# Patient Record
Sex: Female | Born: 1985
Health system: Southern US, Community
[De-identification: ages and names within clinical notes are randomized; demographics above are authoritative.]

## PROBLEM LIST (undated history)

## (undated) ENCOUNTER — Inpatient Hospital Stay (HOSPITAL_COMMUNITY): Payer: Self-pay

## (undated) DIAGNOSIS — Z8759 Personal history of other complications of pregnancy, childbirth and the puerperium: Secondary | ICD-10-CM

## (undated) DIAGNOSIS — M1990 Unspecified osteoarthritis, unspecified site: Secondary | ICD-10-CM

## (undated) DIAGNOSIS — M47816 Spondylosis without myelopathy or radiculopathy, lumbar region: Secondary | ICD-10-CM

## (undated) DIAGNOSIS — J45909 Unspecified asthma, uncomplicated: Secondary | ICD-10-CM

## (undated) DIAGNOSIS — R519 Headache, unspecified: Secondary | ICD-10-CM

## (undated) DIAGNOSIS — T7840XA Allergy, unspecified, initial encounter: Secondary | ICD-10-CM

## (undated) DIAGNOSIS — R51 Headache: Secondary | ICD-10-CM

## (undated) DIAGNOSIS — N83201 Unspecified ovarian cyst, right side: Secondary | ICD-10-CM

## (undated) DIAGNOSIS — N200 Calculus of kidney: Secondary | ICD-10-CM

## (undated) DIAGNOSIS — Z8711 Personal history of peptic ulcer disease: Secondary | ICD-10-CM

## (undated) DIAGNOSIS — D649 Anemia, unspecified: Secondary | ICD-10-CM

## (undated) DIAGNOSIS — T4145XA Adverse effect of unspecified anesthetic, initial encounter: Secondary | ICD-10-CM

## (undated) DIAGNOSIS — T8859XA Other complications of anesthesia, initial encounter: Secondary | ICD-10-CM

## (undated) DIAGNOSIS — Z8719 Personal history of other diseases of the digestive system: Secondary | ICD-10-CM

## (undated) HISTORY — PX: OVARIAN CYST REMOVAL: SHX89

## (undated) HISTORY — DX: Allergy, unspecified, initial encounter: T78.40XA

## (undated) HISTORY — DX: Personal history of peptic ulcer disease: Z87.11

## (undated) HISTORY — PX: HUMERUS SURGERY: SHX672

## (undated) HISTORY — DX: Personal history of other diseases of the digestive system: Z87.19

## (undated) HISTORY — DX: Calculus of kidney: N20.0

## (undated) HISTORY — DX: Unspecified ovarian cyst, right side: N83.201

## (undated) HISTORY — DX: Unspecified osteoarthritis, unspecified site: M19.90

## (undated) HISTORY — DX: Other complications of anesthesia, initial encounter: T88.59XA

## (undated) HISTORY — PX: APPENDECTOMY: SHX54

## (undated) HISTORY — DX: Adverse effect of unspecified anesthetic, initial encounter: T41.45XA

---
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
## 2003-05-26 ENCOUNTER — Other Ambulatory Visit: Admission: RE | Admit: 2003-05-26 | Discharge: 2003-05-26 | Payer: Self-pay | Admitting: *Deleted

## 2004-05-18 ENCOUNTER — Encounter: Admission: RE | Admit: 2004-05-18 | Discharge: 2004-05-18 | Payer: Self-pay | Admitting: Family Medicine

## 2004-08-13 ENCOUNTER — Encounter: Admission: RE | Admit: 2004-08-13 | Discharge: 2004-08-13 | Payer: Self-pay | Admitting: Gastroenterology

## 2008-04-23 ENCOUNTER — Ambulatory Visit (HOSPITAL_COMMUNITY): Admission: RE | Admit: 2008-04-23 | Discharge: 2008-04-23 | Payer: Self-pay | Admitting: Obstetrics

## 2008-05-07 ENCOUNTER — Ambulatory Visit (HOSPITAL_COMMUNITY): Admission: RE | Admit: 2008-05-07 | Discharge: 2008-05-07 | Payer: Self-pay | Admitting: Obstetrics

## 2008-05-26 ENCOUNTER — Inpatient Hospital Stay (HOSPITAL_COMMUNITY): Admission: AD | Admit: 2008-05-26 | Discharge: 2008-05-26 | Payer: Self-pay | Admitting: Obstetrics

## 2008-06-01 ENCOUNTER — Inpatient Hospital Stay (HOSPITAL_COMMUNITY): Admission: AD | Admit: 2008-06-01 | Discharge: 2008-06-01 | Payer: Self-pay | Admitting: Obstetrics and Gynecology

## 2008-09-27 ENCOUNTER — Inpatient Hospital Stay (HOSPITAL_COMMUNITY): Admission: AD | Admit: 2008-09-27 | Discharge: 2008-09-29 | Payer: Self-pay | Admitting: Obstetrics

## 2009-12-06 ENCOUNTER — Inpatient Hospital Stay (HOSPITAL_COMMUNITY): Admission: AD | Admit: 2009-12-06 | Discharge: 2009-12-06 | Payer: Self-pay | Admitting: Obstetrics

## 2009-12-06 ENCOUNTER — Ambulatory Visit: Payer: Self-pay | Admitting: Obstetrics and Gynecology

## 2010-04-12 ENCOUNTER — Inpatient Hospital Stay (HOSPITAL_COMMUNITY): Admission: AD | Admit: 2010-04-12 | Discharge: 2010-04-12 | Payer: Self-pay | Admitting: Obstetrics and Gynecology

## 2010-06-25 ENCOUNTER — Inpatient Hospital Stay (HOSPITAL_COMMUNITY)
Admission: AD | Admit: 2010-06-25 | Discharge: 2010-06-27 | Payer: Self-pay | Source: Home / Self Care | Attending: Obstetrics & Gynecology | Admitting: Obstetrics & Gynecology

## 2010-06-28 LAB — CBC
Hemoglobin: 12.2 g/dL (ref 12.0–15.0)
MCV: 89.9 fL (ref 78.0–100.0)
Platelets: 193 10*3/uL (ref 150–400)
RBC: 4.07 MIL/uL (ref 3.87–5.11)
WBC: 7.1 10*3/uL (ref 4.0–10.5)

## 2010-06-29 LAB — CBC
MCV: 90 fL (ref 78.0–100.0)
Platelets: 165 10*3/uL (ref 150–400)
RBC: 3.39 MIL/uL — ABNORMAL LOW (ref 3.87–5.11)
WBC: 11.2 10*3/uL — ABNORMAL HIGH (ref 4.0–10.5)

## 2010-07-10 ENCOUNTER — Inpatient Hospital Stay (HOSPITAL_COMMUNITY)
Admission: AD | Admit: 2010-07-10 | Discharge: 2010-07-10 | Disposition: A | Discharge status: Home / Self Care | Payer: Managed Care, Other (non HMO) | Source: Ambulatory Visit | Attending: Obstetrics and Gynecology | Admitting: Obstetrics and Gynecology

## 2010-07-10 DIAGNOSIS — N390 Urinary tract infection, site not specified: Secondary | ICD-10-CM | POA: Insufficient documentation

## 2010-07-10 DIAGNOSIS — O2390 Unspecified genitourinary tract infection in pregnancy, unspecified trimester: Secondary | ICD-10-CM | POA: Insufficient documentation

## 2010-07-10 LAB — URINALYSIS, ROUTINE W REFLEX MICROSCOPIC
Bilirubin Urine: NEGATIVE
Bilirubin Urine: NEGATIVE
Ketones, ur: NEGATIVE mg/dL
Leukocytes, UA: NEGATIVE
Nitrite: NEGATIVE
Protein, ur: NEGATIVE mg/dL
Specific Gravity, Urine: 1.02 (ref 1.005–1.030)
Urobilinogen, UA: 0.2 mg/dL (ref 0.0–1.0)
pH: 6.5 (ref 5.0–8.0)

## 2010-07-10 LAB — URINE MICROSCOPIC-ADD ON

## 2010-07-11 LAB — URINE CULTURE
Colony Count: NO GROWTH
Culture: NO GROWTH

## 2010-07-22 ENCOUNTER — Ambulatory Visit (HOSPITAL_COMMUNITY)
Admission: RE | Admit: 2010-07-22 | Discharge: 2010-07-22 | Disposition: A | Discharge status: Home / Self Care | Payer: Managed Care, Other (non HMO) | Source: Ambulatory Visit | Attending: Obstetrics | Admitting: Obstetrics

## 2010-07-22 ENCOUNTER — Other Ambulatory Visit (HOSPITAL_COMMUNITY): Payer: Self-pay | Admitting: Obstetrics

## 2010-07-22 DIAGNOSIS — R1011 Right upper quadrant pain: Secondary | ICD-10-CM

## 2010-07-22 DIAGNOSIS — O99893 Other specified diseases and conditions complicating puerperium: Secondary | ICD-10-CM | POA: Insufficient documentation

## 2010-07-30 ENCOUNTER — Ambulatory Visit: Payer: Managed Care, Other (non HMO) | Admitting: Family Medicine

## 2010-08-22 LAB — URINALYSIS, ROUTINE W REFLEX MICROSCOPIC
Bilirubin Urine: NEGATIVE
Protein, ur: NEGATIVE mg/dL
Urobilinogen, UA: 0.2 mg/dL (ref 0.0–1.0)

## 2010-08-22 LAB — URINE MICROSCOPIC-ADD ON

## 2010-08-22 LAB — POCT PREGNANCY, URINE: Preg Test, Ur: POSITIVE

## 2010-09-15 LAB — CBC
HCT: 37.3 % (ref 36.0–46.0)
Hemoglobin: 11.4 g/dL — ABNORMAL LOW (ref 12.0–15.0)
Hemoglobin: 13 g/dL (ref 12.0–15.0)
MCHC: 35 g/dL (ref 30.0–36.0)
Platelets: 157 10*3/uL (ref 150–400)
RBC: 3.44 MIL/uL — ABNORMAL LOW (ref 3.87–5.11)
WBC: 14.8 10*3/uL — ABNORMAL HIGH (ref 4.0–10.5)
WBC: 6.8 10*3/uL (ref 4.0–10.5)

## 2010-09-26 ENCOUNTER — Emergency Department (HOSPITAL_BASED_OUTPATIENT_CLINIC_OR_DEPARTMENT_OTHER)
Admission: EM | Admit: 2010-09-26 | Discharge: 2010-09-26 | Discharge status: Against Medical Advice | Payer: Managed Care, Other (non HMO) | Attending: Emergency Medicine | Admitting: Emergency Medicine

## 2010-10-22 NOTE — Discharge Summary (Signed)
[facility]  Patient:    Alyssa Le, Alyssa Le Visit Number: [id number] MRN: [id number]          Service Type: SUR Location: 4W 0462 01 Attending Physician:  Arlis Porta Dictated by:   Adolph Pollack, M.D. Admit Date:  04/06/2001 Discharge Date: 04/09/2001   CC:         Marina Gravel, M.D.  Bradly Bienenstock, M.D., [facility]   Discharge Summary  PRINCIPAL DISCHARGE DIAGNOSES: 1. Recurrent appendicitis. 2. Right ovarian cyst. 3. Acute postanesthetic pulmonary edema.  PROCEDURES:  Laparoscopic completion appendectomy and right ovarian cystectomy, April 06, 2001.  REASON FOR ADMISSION:  This is a 25 year old female who underwent a laparoscopic appendectomy for appendicitis up in [place] in May 2002.  In September, she presented with a right lower quadrant abscess.  CT scan demonstrated some residual appendix.  A barium enema verified this.  After her abscess had resolved, it was suggested that she undergo a laparoscopic completion appendectomy.  Also, she was noted to have a right ovarian cyst and that was going to be looked at by Dr. Marina Gravel.  HOSPITAL COURSE:  She underwent the above procedures.  After extubation, she had some laryngeal spasm and coughed against this.  This led to the reaction of some acute pulmonary edema.  She had some hemoptysis.  She was treated with intravenous Lasix and continued to be observed.  She slowly improved from this standpoint.  She was having adequate pain control.  Her wounds looked clean and intact.  She had adequate saturations on room air.  This phenomena was discussed with her so that she could tell anesthesiologist in the future about what had happened.  By her third postoperative day, she was ready to be discharged to home.  DISPOSITION:  Discharged to home on April 09, 2001, in satisfactory condition.  She was told she could return to school in a week.  She was told to  take her home medications, given Tylox for pain.  She will follow up with both Dr. Earlene Plater and myself. Dictated by:   Adolph Pollack, M.D. Attending Physician:  Arlis Porta DD:  04/27/01 TD:  04/28/01 Job: [id number]

## 2010-10-22 NOTE — Op Note (Signed)
[facility]  Patient:    Alyssa Le, Alyssa Le Visit Number: [id number] MRN: [id number]          Service Type: SUR Location: 4W 0462 01 Attending Physician:  Arlis Porta Dictated by:   Adolph Pollack, M.D. Proc. Date: 04/06/01 Admit Date:  04/06/2001 Discharge Date: 04/09/2001   CC:         Marina Gravel, M.D.   Operative Report  PREOPERATIVE DIAGNOSIS:  Recurrent right lower quadrant abscess with possible "stump" appendicitis.  POSTOPERATIVE DIAGNOSIS:  Recurrent right low quadrant abscess, secondary to "stump" appendicitis.  OPERATION:  Diagnostic laparoscopy with laparoscopic appendectomy.  SURGEON:  Adolph Pollack, M.D.  ANESTHESIA:  General.  INDICATIONS:  Ms. Alyssa Le is a 25 year old female, who in May of this year underwent laparoscopic appendectomy for acute appendicitis.  In September of this year she presented to [facility] with right lower quadrant pain, and was found to have a right lower quadrant abscess.  CT scan was suggestive of a possible retained appendix with appendicolith.  She had this percutaneously drained and was placed on IV and then oral antibiotics and underwent a barium enema which again suggested a structure consistent with a retained appendix.  She has been maintained on oral antibiotics and is now brought to the operating room.  She is also going to have an ovarian cystectomy by Dr. Delia Heady.  DESCRIPTION OF PROCEDURE: She is placed supine on the operating table and a general anesthetic was administered.  She was then placed in the lithotomy position. The abdomen and perineal area were sterilely prepped and draped.  A subumbilical incision was made incising the skin sharply.  The subcutaneous tissue was divided sharply.  The fascia and peritoneum were divided sharply under direct vision and the perineal cavity was entered.  A pursestring suture of 0 Vicryl was placed around the fascial edges.  A  Hasson trocar was introduced into the peritoneal cavity and pneumoperitoneum created by insufflation of CO2 gas.  Next a laparoscope was introduced.  Dr. Earlene Plater had previously but a tenaculum on the cervix, and he mobilized the uterus in order to view the right ovarian cyst.  A 5 mm trocar was placed through a previous incision in the lower midline and one was also placed in the left lower quadrant.  Dr. Earlene Plater proceeded with a left ovarian cystectomy as described in his dictated note.  After Dr. Earlene Plater had finished, I approached the cecum and mobilized by dividing adhesions and the lateral peritoneal attachments.  I identified the ileocecal valve and noticed a long structure.  I noticed a staple line across the small structure, mobilizing it and there was retained appendix stump.  I divided mesoappendix with the harmonic scalpel.  I got down to the base of the appendix and its junction with the cecum and I divided the appendix here.  I put it in an Endopouch bag and removed it from the field.  Next, I copiously irrigated out the abdominal cavity with 1.5 liter of saline solution.  I inspected the ovarian cyst area and the cecal area and saw no bleeding.  I evacuated all the fluid.  I then removed the 5 mm trocars under direct vision and saw no bleeding from them.  I released the pneumoperitoneum, removed the [facility] trocar and closed the fascial defect under direct vision by tightening up and tying down the pursestring suture.  Skin incisions were subsequently closed with 4-0 Monocryl subcuticular sutures, followed by Steri-Strips and sterile dressings.  The patient tolerated the procedure well, finding no apparent complications.  She was taken to the recovery room in satisfactory condition. Dictated by:   Adolph Pollack, M.D. Attending Physician:  Arlis Porta DD:  04/06/01 TD:  04/09/01 Job: [id number]

## 2010-10-22 NOTE — Consult Note (Signed)
[facility]  Patient:    Alyssa Le, Alyssa Le Visit Number: [id number] MRN: [id number]          Service Type: PED Location: PEDS [phone] Attending Physician:  McDiarmid, Leighton Roach. Consultation Date: 02/15/01 Admit Date:  02/16/2001   CC:         Conni Elliot, M.D.   Consultation Report  REASON FOR CONSULTATION:      Right lower quadrant pain and abscess.  HISTORY OF PRESENT ILLNESS:   Ms. Alyssa Le is a 25 year old female who underwent a laparoscopic appendectomy in May of 2002 for acute appendicitis.  She had done well until approximately two weeks ago when she began again having mild diffuse abdominal pain.  The pain worsened and on September 8, she had severe pain in the right lower quadrant leading her to come to the emergency department at [facility].  She had some nausea, but no vomiting.  She was tolerating a liquid diet.  She was initially seen at [facility] emergency department and felt to have pelvic inflammatory disease and transferred to [facility] where she was admitted and started on appropriate antibiotics.  She has continued to have some fever spikes, although, the white blood cell count has improved. She continued to complain of right lower quadrant pain and had right lower quadrant tenderness.  Subsequently, a CT scan of her lower abdomen and pelvis was ordered which demonstrates a right lower quadrant inflammatory process with some fluid consistent with abscess.  There is some thickening of the terminal ileum.  It was for this reason that I was called.  PAST MEDICAL HISTORY:         1. Asthma.                               2. Depression.  PAST SURGICAL HISTORY:        Laparoscopic appendectomy; bone lengthening.  ALLERGIES:                    No known drug allergies.  MEDICATIONS:                  1. Colace.                               2. Doxycycline.             3. Prozac.                               4. Cefotan.                               5. Ambien.                               6. Phenergan p.r.n.                               7. Tylox.  SOCIAL HISTORY:               She has moved here with her mother and father. She has a boyfriend back in the [place].  REVIEW OF  SYSTEMS:            She states she has been voiding alot since she has been in the hospital, but it has not been painful.  She denies any diarrhea.  PHYSICAL EXAMINATION:  GENERAL:                      A well-developed, well-nourished female who is quite emotional at times.  VITAL SIGNS:                  Temperature 100.9.  ABDOMEN:                      Soft with right lower quadrant and pelvic tenderness to palpation and percussion.  Active bowel sounds are noted.  There is well-healed small scars.  She has a Armed forces logistics/support/administrative officer in.  No masses are noted.  LABORATORY DATA:              Todays white blood cell count 6800 with no left shift.  Her gynecoccal and Chlamydia screens are negative.  HIV negative.  IMPRESSION:                   Right lower quadrant inflammatory process with abscess.  Differential diagnosis includes retained stump appendicitis with perforation and abscess, terminal ileitis and microperforation and abscess, cecal diverticulitis, Meckels diverticulitis.  RECOMMENDATIONS:              I would change her antibiotics to Unasyn to include potential Enterococcal coverage as this could be superinfection and could be a significant pathogen in this case.  I would recommend that she have radiologic percutaneous drainage of the abscess cavity (she likely will need to be transferred to [facility] to have this done).  If she is unable to have that done, she may well need exploratory laparotomy.  I did explain this to her and to her mother as well as the physician covering for the primary service. Attending Physician:  McDiarmid, Tawanna Cooler  D. DD:  02/16/01 TD:  02/16/01 Job: [id number]

## 2010-10-22 NOTE — Discharge Summary (Signed)
[place]  Patient:    Alyssa Le, DITULLIO Visit Number: [id number] MRN: [id number]          Service Type: PED Location: PEDS [phone] Attending Physician:  McDiarmid, Leighton Roach. Dictated by:   Bradly Bienenstock, M.D. Admit Date:  02/16/2001 Discharge Date: 02/18/2001   CC:         [facility]  Adolph Pollack, M.D.   Discharge Summary  ADMISSION DIAGNOSES: 1. Pelvic inflammatory disease. 2. History of depression.  DISCHARGE DIAGNOSES: 1. Periappendiceal abscess, status post drainage. 2. History of depression.  DISCHARGE MEDICATIONS: 1. Ciprofloxacin 500 mg 1 tablet b.i.d. x 10 days. 2. Flagyl 750 mg 1 tablet b.i.d. x 10 days. 3. Prozac 20 mg 1 tablet q.d. 4. Ortho Tri-Cyclen 1 tablet q.d. 5. Percocet 2.5/325 1-2 pills q.4-6h. p.r.n. pain.  ACTIVITY:  No restrictions.  WOUND CARE:  The patient was instructed to keep her abdominal wound clean.  FOLLOW-UP:  The patient is to follow up at the [facility] within the next week.  With Dr. Abbey Chatters in the next two to three weeks.  PROCEDURES:  The patient had an ultrasound-guided percutaneous drainage of ______  abscess.  Roughly 4 cc of purulent material was drained.  CONSULTATIONS:  Surgery, Dr. Avel Peace.  HISTORY OF PRESENT ILLNESS:  This is a 25 year old, G0, white female who presented to [facility] on February 12, 2001, with the chief complaint of onset of lower abdominal pain beginning two days prior to presentation.  The patient stated that she had been having nausea, as well, over the past couple of days but, on the day of presentation, the pain and the nausea increased to a fairly severe level.  The patient recorded a fever to 100.4 degrees on the day of presentation and also complained of intermittent back pain.  On questioning, the patient states that she had been with her current sexual partner for the  past 10 months and that she was not aware of her partner having any other partners.  REVIEW OF SYSTEMS:  Negative other than as described in the HPI.  PAST SURGICAL HISTORY:  Appendectomy, done in April 2002.  PAST MEDICAL HISTORY: 1. History of depression. 2. Asthma. 3. Bout of mononucleosis approximately three months ago.  SOCIAL HISTORY:  The child lives with both her parents and one sister.  They have recently moved here from [place].  PHYSICAL EXAMINATION:  VITAL SIGNS:  Temperature on presentation was 101.4.  HEENT:  Normal.  LUNGS:  Clear to auscultation.  ABDOMEN:  Soft.  Positive bowel sounds.  Tender to palpation bilaterally in the lower quadrants, right greater than left, with rebound tenderness.  PELVIC:  No external lesions.  There was a moderate discharge.  Cervix did show some cervical motion tenderness, and the patient seemed to be more tender in her right adnexa.  LABORATORY DATA:  White count 12.8, hemoglobin 10.5, platelets 212, shift was 93% neutrophils.  ASSESSMENT:  This was thought to be a case of pelvic inflammatory disease; therefore, the patient was admitted and begun on IV antibiotics.  She was begun on doxycycline and cefotetan.  HOSPITAL COURSE:  The patient was admitted to [facility] and begun on IV antibiotics but failed to improve on IV antibiotics.  A pelvic ultrasound was done which did not show any evidence of tubo-ovarian abscess.  Secondary to the patients persistent pain, as well as a GC and chlamydia test which  both came back negative, it was decided to get a CT scan to explore whether the patient might have other intra-abdominal processes going on.  On CT scan there which was performed on February 15, 2001, there was evidence of ______ inflammation in the area of the patients appendiceal stump.  When this was recognized, general surgery was consulted, and Dr. Avel Peace did evaluate her.  Dr. Abbey Chatters felt that the  patient should have a slight alteration of her antibiotics.  She was changed to Unasyn, and percutaneous drainage of the abscess was warranted.  Therefore, secondary to the need to perform the drainage, the patient was transferred to [facility] on February 16, 2001.  The patient underwent drainage of the abscess on February 16, 2001, with expression of roughly 4 cc of purulent material. After the abscess was decompressed, the patient did quite a bit better.  She had a break in her fever curve as well as diminishment of her pain.  She was able to tolerate food without nausea and vomiting.  Secondary to her marked improvement, it was decided that the patient could be discharged home on Monday, September ______ , 2002, with follow-up with Dr. Abbey Chatters to assess whether later surgical intervention might be necessary.  However, at this time it does not appear that that is the case.  CONDITION ON DISCHARGE:  The patient was discharged in good condition.  DISCHARGE LABORATORY DATA:  On February 17, 2001, white count was 7.4, hemoglobin was 9.4, hematocrit 27.7, platelet count of 162.  Hepatitis B core antibody negative.  HIV test negative.  Hepatitis B surface antigen negative. RPR nonreactive.  Cervical, gonorrhea, and chlamydia swabs were negative. Dictated by:   Bradly Bienenstock, M.D. Attending Physician:  McDiarmid, Tawanna Cooler D. DD:  02/19/01 TD:  02/19/01 Job: [id number]

## 2010-10-22 NOTE — H&P (Signed)
[facility]  Patient:    Alyssa Le, Alyssa Le Visit Number: [id number] MRN: [id number]          Service Type: PED Location: PEDS [phone] Attending Physician:  McDiarmid, Leighton Roach. Dictated by:   Marina Gravel, M.D. Admit Date:  02/16/2001 Discharge Date: 02/18/2001   CC:         Adolph Pollack, M.D.   History and Physical  CHIEF COMPLAINT:  Right ovarian cyst, right lower quadrant pain.  INTENDED PROCEDURE:  Diagnostic laparoscopy, right ovarian cystectomy and possible need for a right salpingo-oophorectomy.  HISTORY OF PRESENT ILLNESS:  Alyssa Le is a 25 year old female, gravida 0 who I saw at the request of Adolph Pollack, M.D. for recommendations regarding a right ovarian cyst. She has a history of laparoscopic appendectomy in May 2002 for acute appendicitis. She has then had recurrent right lower quadrant pain. Subsequently, has had an abscess in the right lower quadrant drained and now presents for revision of her appendectomy. However, during that workup, she was noted to have a simple right ovarian cyst. Adolph Pollack, M.D. asked me to see the patient for further recommendations.  Ultrasound was repeated in my office which shows a simple, 2.5 cm right ovarian cyst. This area is tender; and on pelvic exam, it is not very mobile concerning for adhesive disease presumably from the previous inflammatory process. The patient presents for definitive surgical therapy and diagnosis of her continued right lower quadrant pain.  PAST MEDICAL HISTORY:  Exercise-induced asthma and depression.  PAST SURGICAL HISTORY:  Laparoscopic appendectomy, right humerus bone lengthening, and CT drainage of right lower quadrant abscess.  CURRENT MEDICATIONS:  Ortho Tri-Cyclen and Prozac.  ALLERGIES:  None.  SOCIAL HISTORY:  No alcohol, tobacco, or drugs.  FAMILY HISTORY:  Noncontributory.  REVIEW OF SYSTEMS:  Otherwise noncontributory.  PHYSICAL  EXAMINATION:  VITAL SIGNS:  Blood pressure 90/60, pulse 60, weight 123.  GENERAL:  The patient is a thin, white female who is well groomed, normal body habitus, no deformities.  HEART:  Regular rate and rhythm.  LUNGS:  Clear to auscultation.  ABDOMEN:  Liver, spleen normal. No hernias. Previous laparoscopic incision at the umbilicus as healed well. No acute signs are noted on abdominal exam. However, the patient does have tenderness in the right lower quadrant.  PELVIC:  Normal external female genitalia. Vagina and cervix normal. Uterus normal size, anterior. There is an adnexal mass palpable on the right. It is poorly mobile, and this is where patient is tender. Otherwise, pelvic exam is unremarkable.  LABORATORY DATA:  Ultrasound was repeated as outlined above and shows persistent 2.5 cm simple, right ovarian cyst.  ASSESSMENT:  Right lower quadrant pain, history of appendicitis, status post laparoscopic appendectomy, and subsequent drainage of recurrent abscess. Now with persistent right lower quadrant pain and right ovarian cyst.  I discussed with the patient that the right ovarian cyst may not be involved in the acute process. However, there may be scarring from the ovary to the right lower quadrant where her previous abscess was drained. This may necessitate right salpingo-oophorectomy to aid in the complete clearing of the process in the right lower quadrant. However, would prefer to perform right ovarian cystectomy only and have Adolph Pollack, M.D. perform the revision of her appendectomy as he feels is indicated. Operative risks discussed with the patient including infection, bleeding, damage to bowel or bladder or surrounding organs, and again potential need for right salpingo-oophorectomy. All questions answered. The patient wishes to  proceed. Arrangements have been surgery this Friday at [facility] as a combined procedure with Adolph Pollack, M.D. and  myself as outlined above. Dictated by:   Marina Gravel, M.D. Attending Physician:  McDiarmidTawanna Cooler D. DD:  04/05/01 TD:  04/05/01 Job: [id number]

## 2010-10-22 NOTE — H&P (Signed)
[place]  Patient:    Alyssa Le, HORAN Visit Number: [id number] MRN: [id number]          Service Type: EMS Location: ED Attending Physician:  Devoria Albe Dictated by:   Ocie Doyne, M.D. Admit Date:  02/11/2001 Discharge Date: 02/11/2001   CC:         Adolph Pollack, M.D.   History and Physical  CHIEF COMPLAINT:  Right lower quadrant abscess.  HISTORY OF PRESENT ILLNESS:  This 25 year old female was admitted at [facility] on February 12, 2001, with a two-day history of gradually increasing abdominal pain, initially diffuse but admission it was radiating to the right lower quadrant and quite severe in intensity. She had a laparoscopic appendectomy in May of 2002, in [place] without complications. On admission, she was febrile to 101.4 and felt to have pelvic inflammatory disease based on her clinical presentation.  She was treated with appropriate antibiotics but failed to improve.  An abdominal and pelvic CT scan on February 15, 2001, revealed a right lower quadrant inflammatory process and abscess with terminal ileum thickening.  Surgery was consulted to see her and this was felt to possibly represent a recurrence of inflammation at the appendix stump. She was transferred to [facility] today for radiologic guided percutaneous drainage of the abscess.  PAST MEDICAL HISTORY:  Asthma and depression.  MEDICATIONS: 1. Prozac 10 mg q.d. 2. Ortho-Cyclen oral contraceptive pill. 3. At transfer, she is on Unasyn 3 g IV q.6h.  ALLERGIES:  No known drug allergies.  She reports her throat swells when she eats FRESH FRUIT.  SOCIAL HISTORY:  The patient moved from [place] three weeks ago.  She lives with her parents and a 42 year old sister. She is a Holiday representative in high school.  FAMILY HISTORY:  Noncontributory.  PHYSICAL EXAMINATION:  GENERAL APPEARANCE:  She is alert, pleasant but tearful in no  acute distress.  VITAL SIGNS:  Temperature 99.4, heart rate 72, blood pressure 113/54, respiratory rate 20, oxygen saturation 98% on room air.  HEENT:  Normocephalic and atraumatic.  Pupils are equal, round, reactive to light and accommodation.  Extraocular movements intact.  Oropharynx is without lesions or exudate.  NECK:  Supple with no lymphadenopathy, no thyromegaly.  LUNGS:  Clear to auscultation bilaterally with good air movement.  CARDIOVASCULAR:  Regular rate and rhythm with no murmur.  ABDOMEN:  Soft, nondistended with positive bowel sounds.  Tender at the right lower quadrant with guarding. There is no palpable mass.  EXTREMITIES:  Capillary refill less than two seconds.  There is no clubbing, cyanosis, or edema.  LABS:  White blood cell count 6.0, hemoglobin 12.6, hematocrit 36.3, platelets 330.  PTT 43, PT 12.7, INR 0.9.  Blood cultures drawn February 11, 2001, are still pending.  ASSESSMENT AND PLAN: 1. Abscess.  The patient is scheduled to undergo radiographically guided    percutaneous drain placement today.  Radiology has already been to the    floor to see her and they have the abdominal and pelvic CT that was    performed at [facility]. The patient will continue on IV Unasyn for    coverage of gram positives, gram negatives and anaerobes. She will remain    NPO until the drain is placed, then will resume a regular diet as    tolerated.  Maintenance IV fluids will be continued until she is able to    tolerate good p.o.  She is receiving  Tylox for discomfort and her pain has    been adequately controlled. Surgery will continue to follow her during this    hospitalization. 2. Depression.  The patient was tearful out of proportion to her pain. She    disclosed that she relocated to [place], [place], from [place]    approximately three weeks ago after her father lost his job. She has been    very sad about the move.  Additionally, her boyfriend  of the past one year    remained behind in [place]. She is planning to travel to see him in one    week and is concerned that she may not be able to make that trip because of    her hospitalization.  Her Prozac dose was increased this morning from 10 to    20 mg. No other medication changes will be made at the current time. The    patient denies any suicidal ideations and her depression may be more    situational than major depressive disorder. Her old records are not    available and this would be an issue that will require outpatient follow-up    over time to assess further. Dictated by:   Ocie Doyne, M.D. Attending Physician:  Devoria Albe DD:  02/16/01 TD:  02/16/01 Job: [id number]

## 2010-10-22 NOTE — Op Note (Signed)
[facility]  Patient:    Alyssa Le, Alyssa Le Visit Number: [id number] MRN: [id number]          Service Type: SUR Location: [id number] Attending Physician:  Arlis Porta Dictated by:   Marina Gravel, M.D. Proc. Date: 04/06/01 Admit Date:  04/06/2001 Discharge Date: 04/09/2001   CC:         Adolph Pollack, M.D.                           Operative Report  PREOPERATIVE DIAGNOSIS:  Right lower quadrant pain, persistent right ovarian cyst, and probable appendiceal stump.  POSTOPERATIVE DIAGNOSIS:  Right lower quadrant pain, persistent right ovarian cyst, and probable appendiceal stump.  PROCEDURE:  Laparoscopic right ovarian cystectomy.  SURGEON:  Marina Gravel, M.D.  ASSISTANT:  Adolph Pollack, M.D.  ANESTHESIA:  General.  SPECIMENS:  Right ovarian cyst wall.  FINDINGS:  Right simple ovarian cyst and collapsing left ovarian cyst, otherwise normal tubes, ovaries, and uterus.  ESTIMATED BLOOD LOSS:  Less than 50 cc.  COMPLICATIONS:  None.  INDICATIONS:  The patient with persistent right lower quadrant pain and recurrent stump appendicitis.  Was also found to have a right ovarian cyst during the workup with a CT scan.  I was asked by Dr. Abbey Chatters to be present for the surgery to evaluate and treat the ovarian cyst in conjunction with his laparoscopic revision of the appendiceal stump.  DESCRIPTION OF PROCEDURE:  The patient was taken to the operating room and general anesthesia obtained.  I examined the patient under anesthesia, and again noted a palpable right adnexal mass which was decreased in size from last exam.  The patient was then prepped and draped in the standard fashion. A Foley catheter was inserted into the bladder.  The cervix was visualized with the speculum and grasped with a tooth tenaculum on the anterior lip. Then, a ________ cannula was inserted into the cervical canal and attached to the tooth  tenaculum.  Attention was then turned to the umbilicus.  Dr. Abbey Chatters got into the abdomen with Hasson technique.  Please see his dictation for details.  Once we were in the abdomen, the pelvis was inspected with the above findings noted.  The patient was placed in the Trendelenburg position, the bowel mobilized superiorly with a blunt probe.  Inferior ports were inserted 2 cm above the symphysis in the midline and 8 cm off the midline, each a 5 mm trocar under direct laparoscopic visualization.  The right ovary was then grasped with atraumatic forceps, and the cyst wall entered with monopolar scissors.  The cyst wall was then divided sharply and the ovarian cyst removed with blunt technique.  This was submitted and the specimen labeled right ovarian cyst wall.  The cyst wall bed was then made hemostatic with monopolar cautery, irrigated, and found to be hemostatic.  My portion of the case was therefore terminated, and I left the patient in the care of Dr. Abbey Chatters who was assessing the patients appendiceal stump.  The patient tolerated the procedure well and there were no complications. Dictated by:   Marina Gravel, M.D. Attending Physician:  Arlis Porta DD:  04/06/01 TD:  04/09/01 Job: [id number]

## 2011-09-05 DIAGNOSIS — R319 Hematuria, unspecified: Secondary | ICD-10-CM | POA: Insufficient documentation

## 2011-09-12 ENCOUNTER — Ambulatory Visit (INDEPENDENT_AMBULATORY_CARE_PROVIDER_SITE_OTHER): Payer: BC Managed Care – PPO | Admitting: Physician Assistant

## 2011-09-12 VITALS — BP 122/66 | HR 60 | Temp 98.8°F | Resp 16 | Ht 66.0 in | Wt 122.0 lb

## 2011-09-12 DIAGNOSIS — R109 Unspecified abdominal pain: Secondary | ICD-10-CM

## 2011-09-12 DIAGNOSIS — R21 Rash and other nonspecific skin eruption: Secondary | ICD-10-CM

## 2011-09-12 DIAGNOSIS — R05 Cough: Secondary | ICD-10-CM

## 2011-09-12 DIAGNOSIS — R1032 Left lower quadrant pain: Secondary | ICD-10-CM

## 2011-09-12 LAB — POCT UA - MICROSCOPIC ONLY
Casts, Ur, LPF, POC: NEGATIVE
Crystals, Ur, HPF, POC: NEGATIVE
Yeast, UA: NEGATIVE

## 2011-09-12 LAB — POCT CBC
Lymph, poc: 1.8 (ref 0.6–3.4)
MCH, POC: 28.9 pg (ref 27–31.2)
MCHC: 33.1 g/dL (ref 31.8–35.4)
MID (cbc): 0.5 (ref 0–0.9)
MPV: 10.3 fL (ref 0–99.8)
POC LYMPH PERCENT: 49.8 %L (ref 10–50)
POC MID %: 14.4 %M — AB (ref 0–12)
Platelet Count, POC: 263 10*3/uL (ref 142–424)
RBC: 4.5 M/uL (ref 4.04–5.48)
RDW, POC: 13.6 %
WBC: 3.7 10*3/uL — AB (ref 4.6–10.2)

## 2011-09-12 LAB — POCT URINALYSIS DIPSTICK
Glucose, UA: NEGATIVE
Nitrite, UA: NEGATIVE
Protein, UA: NEGATIVE
Urobilinogen, UA: NEGATIVE

## 2011-09-12 LAB — POCT URINE PREGNANCY: Preg Test, Ur: NEGATIVE

## 2011-09-12 NOTE — Progress Notes (Signed)
Subjective:    Patient ID: Alyssa Le, female    DOB: [birth year], 26 y.o.   MRN: [id number]  HPI [name] comes in tonight c/o myalgias one week ago with associated ST and lower abdominal pain that started 6 days ago.  Went to [facility].  Given Keflex for UTI and ST but urine culture was negative.  Pt had hematuria on UA dip but has a noted h/o urology evaluation for this in 2006. Symptoms improved slightly but now with myalgias again, nausea and abdominal pain again that started yesterday.  No fever or chills.  No vomiting or diarrhea.  Has had nasal congestion and cough.  Rash started 1 week ago as well.  She is mainly worried something is wrong but does state that she does feel better today. Has IUD x 1 year Married   Review of Systems As above    Objective:   Physical Exam  Constitutional: She appears well-developed and well-nourished.  Cardiovascular: Normal rate and regular rhythm.   Pulmonary/Chest: Effort normal and breath sounds normal.  Abdominal: Soft. Bowel sounds are normal. There is tenderness (LLQ and suprapubic) in the right upper quadrant and left lower quadrant. There is no rebound, no guarding, no CVA tenderness and negative Murphy's sign.  Genitourinary: Vagina normal. Uterus is deviated (from left). Cervix exhibits no motion tenderness, no discharge and no friability. Right adnexum displays no mass, no tenderness and no fullness. Left adnexum displays tenderness and fullness. Left adnexum displays no mass.       IUD string noted   Neurological: She is alert.  Skin:       Macular rash scattered.  2 noted to have a slight target pattern.  Spares palms and soles.    Results for orders placed in visit on 09/12/11  POCT CBC      Component Value Range   WBC 3.7 (*) 4.6 - 10.2 (K/uL)   Lymph, poc 1.8  0.6 - 3.4    POC LYMPH PERCENT 49.8  10 - 50 (%L)   MID (cbc) 0.5  0 - 0.9    POC MID % 14.4 (*) 0 - 12 (%M)   POC Granulocyte 1.3 (*) 2 - 6.9    Granulocyte percent  35.8 (*) 37 - 80 (%G)   RBC 4.50  4.04 - 5.48 (M/uL)   Hemoglobin 13.0  12.2 - 16.2 (g/dL)   HCT, POC 782.9 (*) 56.2 - 47.9 (%)   MCV 87.3  80 - 97 (fL)   MCH, POC 28.9  27 - 31.2 (pg)   MCHC 33.1  31.8 - 35.4 (g/dL)   RDW, POC 13.0     Platelet Count, POC 263  142 - 424 (K/uL)   MPV 10.3  0 - 99.8 (fL)  POCT UA - MICROSCOPIC ONLY      Component Value Range   WBC, Ur, HPF, POC 0-3     RBC, urine, microscopic 4-6     Bacteria, U Microscopic trace     Mucus, UA neg     Epithelial cells, urine per micros 0-4     Crystals, Ur, HPF, POC neg     Casts, Ur, LPF, POC neg     Yeast, UA neg    POCT URINALYSIS DIPSTICK      Component Value Range   Color, UA yellow     Clarity, UA clear     Glucose, UA neg     Bilirubin, UA neg     Ketones, UA neg  Spec Grav, UA 1.015     Blood, UA mod     pH, UA 6.5     Protein, UA neg     Urobilinogen, UA negative     Nitrite, UA neg     Leukocytes, UA Negative    POCT URINE PREGNANCY      Component Value Range   Preg Test, Ur Negative           Assessment & Plan:  Abdominal Pain, LLQ Rash Cough Microhematuria, previously evaluated  Reassure. Likely viral illness that is resolving.  Watch abdominal pain.  If worsens, pursue pelvic US or follow up with Dr. Algie Coffer. Patient to call in the morning with status.

## 2011-09-13 ENCOUNTER — Telehealth: Payer: Self-pay

## 2011-09-13 NOTE — Telephone Encounter (Signed)
PT SAW Alyssa Le YESTERDAY, 09/12/11.  SHE IS BETTER, BUT WANTED [place] TO KNOW THAT THE SKIN FROM HER RASH IS PEELING OFF ON ONE ARM AND IS NOW RESEMBLING A SLIGHT BURN.

## 2011-09-14 LAB — GC/CHLAMYDIA PROBE AMP, GENITAL: Chlamydia, DNA Probe: NEGATIVE

## 2011-09-14 NOTE — Telephone Encounter (Signed)
LMOM that the rash may peel and that is ok unless there are new symptoms along with it.  Advised that labs were still pending and that I was glad she was feeling better.  Call us back if she had further questions.

## 2011-09-18 ENCOUNTER — Telehealth: Payer: Self-pay

## 2011-09-18 NOTE — Telephone Encounter (Signed)
Patient was seen last week by Kennedy Bucker and would like a call back with lab results. CB # [phone]

## 2011-09-19 NOTE — Telephone Encounter (Signed)
LMOM THAT LABS WERE NORMAL.

## 2011-09-19 NOTE — Telephone Encounter (Signed)
Just received her labs. Sorry for the delay.  There was an issue with lab.  Here CMET and gen probe are normal.

## 2011-09-21 NOTE — Telephone Encounter (Signed)
Pt called in to say that she still has the same rash and if it was viral how long does it take to go away, she would like a call back.

## 2011-12-18 IMAGING — US US ABDOMEN COMPLETE
1 series · 14 of 25 positions shown · non-contrast
Comparison: 2111

CLINICAL DATA: 3 weeks postpartum with right upper quadrant pain

COMPLETE ABDOMINAL ULTRASOUND

[Series 1: us abdomen complete · 14 of 92 slices shown]
[im 1/92]
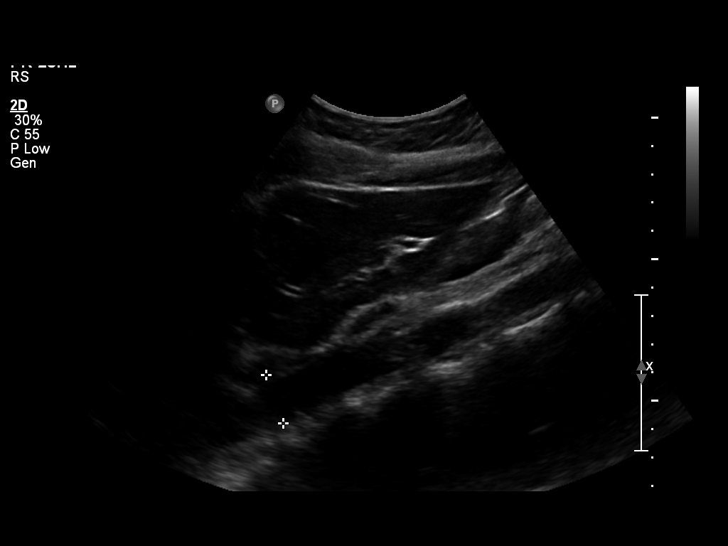
[im 8/92]
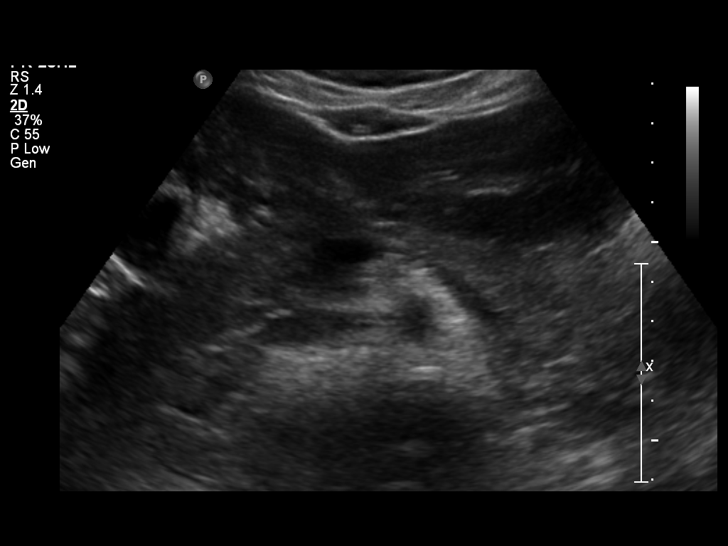
[im 16/92]
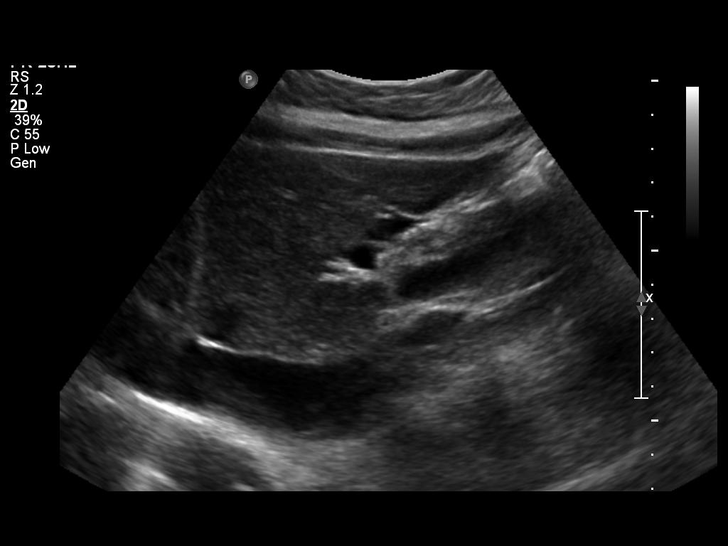
[im 23/92]
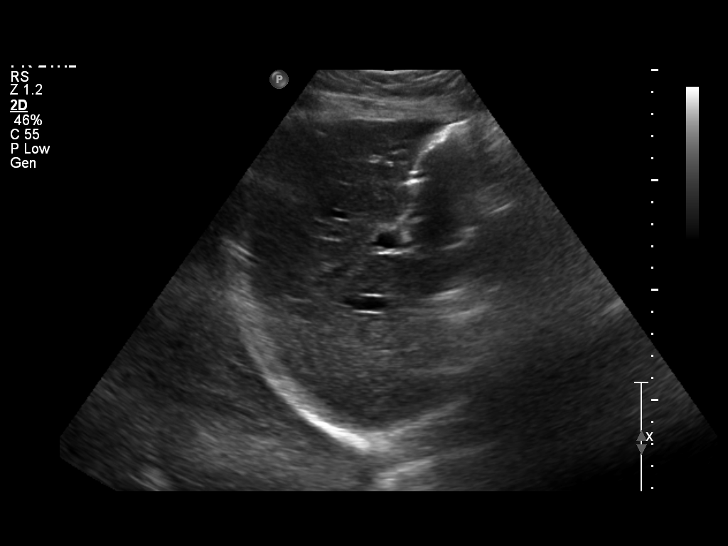
[im 31/92]
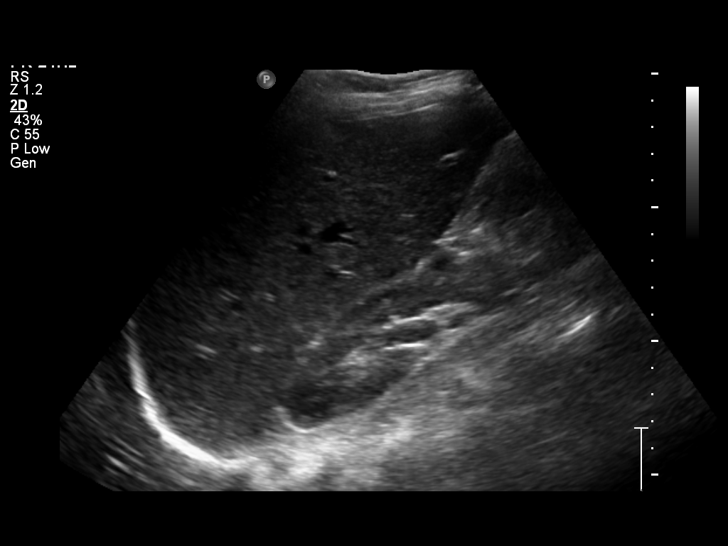
[im 35/92]
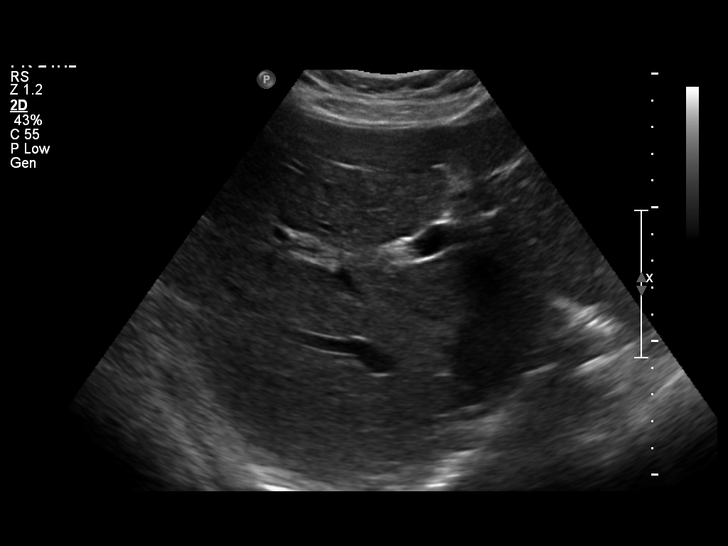
[im 42/92]
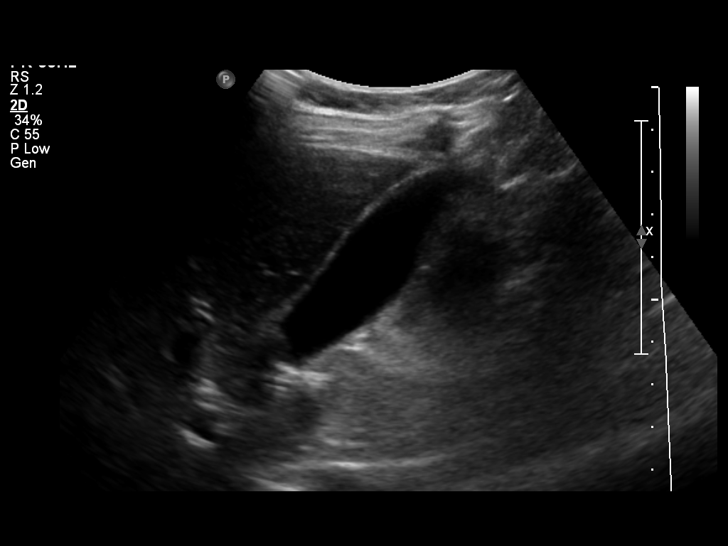
[im 50/92]
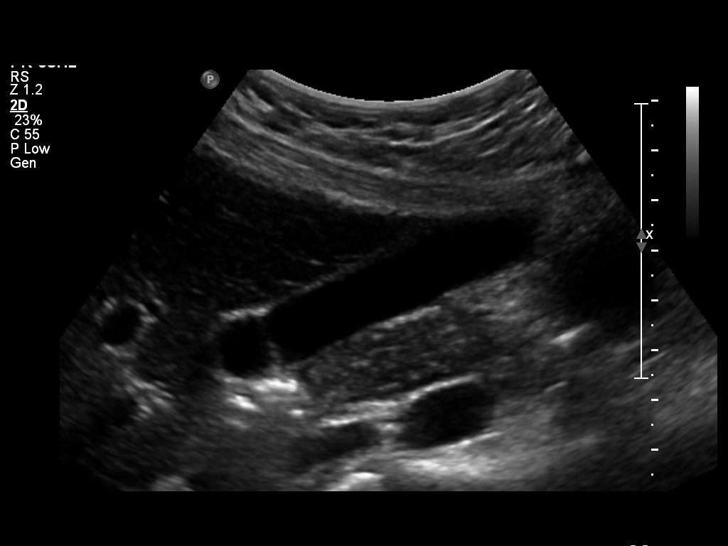
[im 57/92]
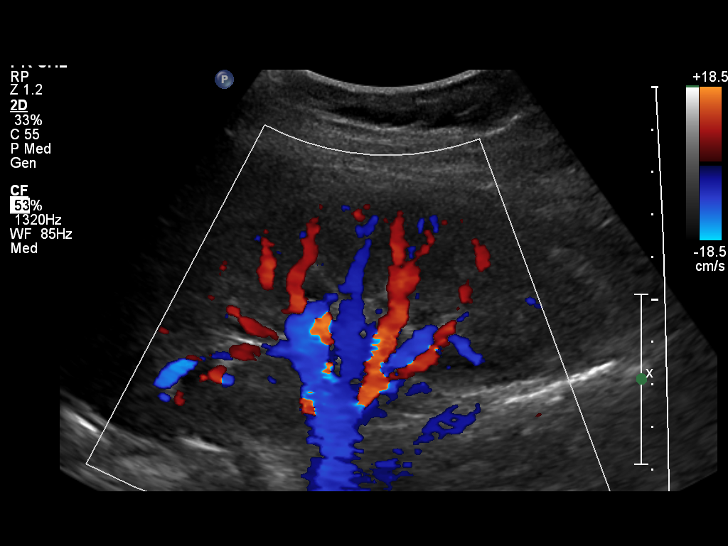
[im 61/92]
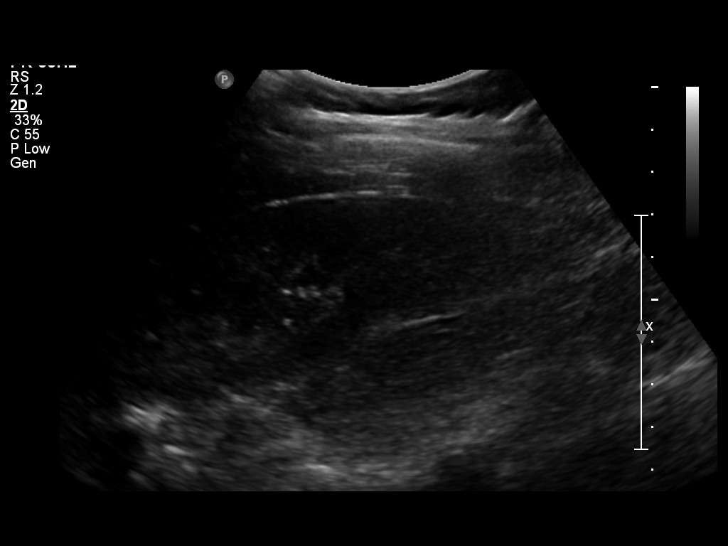
[im 69/92]
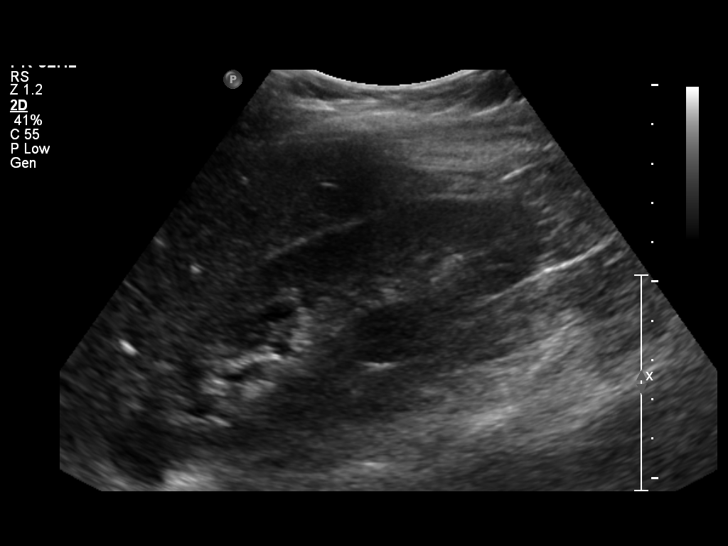
[im 76/92]
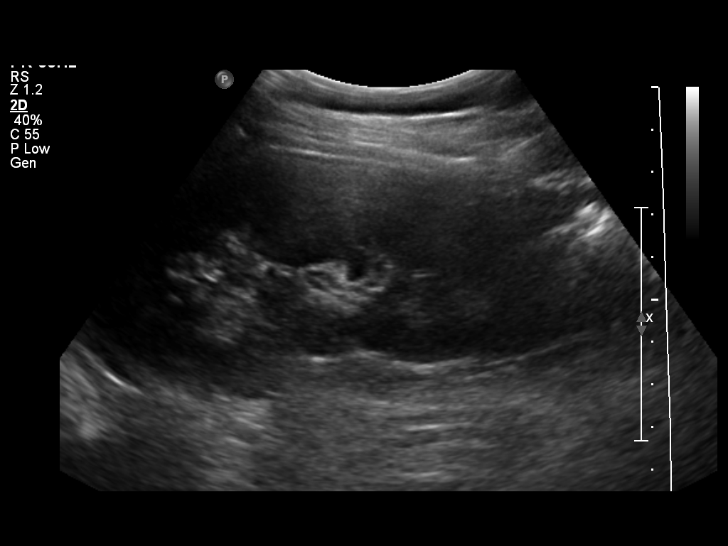
[im 84/92]
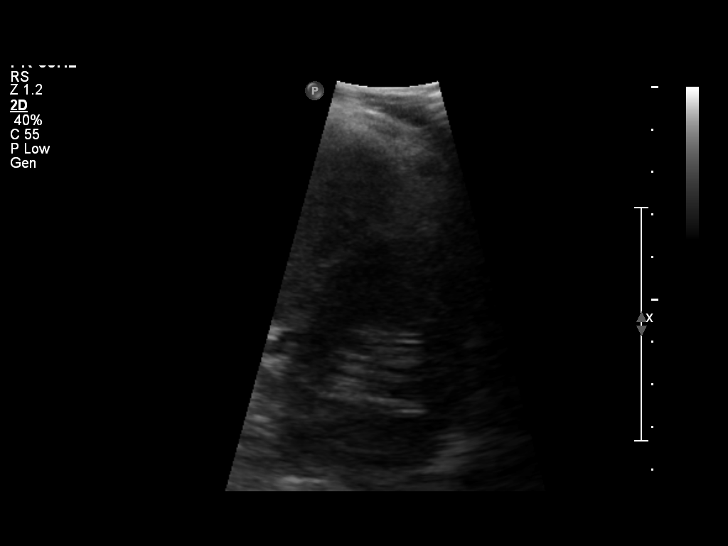
[im 92/92]
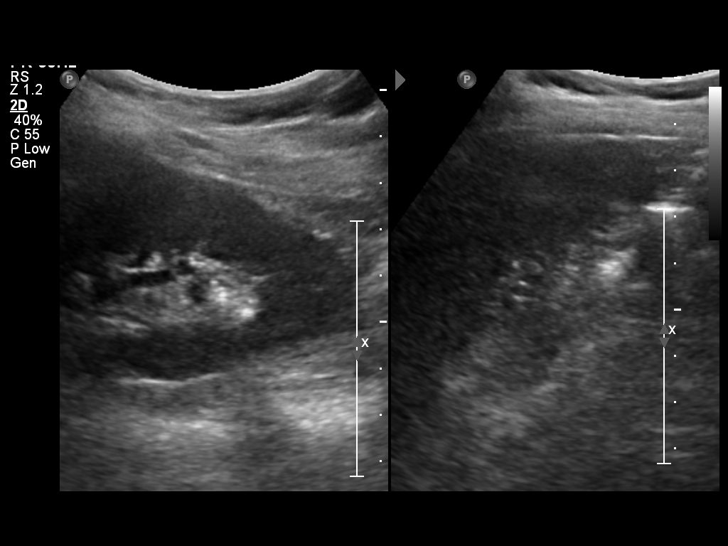

[14 of 25 positions shown; findings below may reference images not displayed]

FINDINGS: Gallbladder:  No gallstones, gallbladder wall thickening, or
pericholecystic fluid. Sonographic Murphy's sign was negative

Common bile duct:  Measures 0.5 cm in AP depth and has a normal
appearance

Liver:  No focal lesion identified.  Within normal limits in
parenchymal echogenicity.

IVC:  Appears normal.

Pancreas:  No focal abnormality seen.

Spleen:  Has a sagittal length of 6.3 cm.  No focal parenchymal
abnormalities are noted

Right Kidney:  Has a sagittal length of 12.6 cm.  No focal
parenchymal abnormality or signs of hydronephrosis are seen

Left Kidney:  Has a sagittal length of 12.6 cm.  No focal
parenchymal abnormality or signs of hydronephrosis is seen

Abdominal aorta:  Has a maximal caliber of 1.8 cm with no
aneurysmal dilatation noted.
IMPRESSION: Negative abdominal ultrasound.

## 2012-07-06 ENCOUNTER — Ambulatory Visit: Payer: Managed Care, Other (non HMO) | Admitting: Family Medicine

## 2012-07-06 DIAGNOSIS — Z0289 Encounter for other administrative examinations: Secondary | ICD-10-CM

## 2013-06-14 ENCOUNTER — Ambulatory Visit (INDEPENDENT_AMBULATORY_CARE_PROVIDER_SITE_OTHER): Payer: BC Managed Care – PPO | Admitting: Nurse Practitioner

## 2013-06-14 ENCOUNTER — Encounter: Payer: Self-pay | Admitting: Nurse Practitioner

## 2013-06-14 VITALS — BP 118/68 | HR 74 | Temp 98.8°F | Resp 16 | Ht 66.0 in | Wt 124.0 lb

## 2013-06-14 DIAGNOSIS — R519 Headache, unspecified: Secondary | ICD-10-CM

## 2013-06-14 DIAGNOSIS — M21929 Unspecified acquired deformity of unspecified upper arm: Secondary | ICD-10-CM

## 2013-06-14 DIAGNOSIS — R51 Headache: Secondary | ICD-10-CM

## 2013-06-14 DIAGNOSIS — Z0000 Encounter for general adult medical examination without abnormal findings: Secondary | ICD-10-CM | POA: Insufficient documentation

## 2013-06-14 HISTORY — DX: Unspecified acquired deformity of unspecified upper arm: M21.929

## 2013-06-14 LAB — CBC
HCT: 39.3 % (ref 36.0–46.0)
HEMOGLOBIN: 13.3 g/dL (ref 12.0–15.0)
MCHC: 33.8 g/dL (ref 30.0–36.0)
MCV: 89.5 fl (ref 78.0–100.0)
PLATELETS: 226 10*3/uL (ref 150.0–400.0)
RBC: 4.39 Mil/uL (ref 3.87–5.11)
RDW: 13.1 % (ref 11.5–14.6)
WBC: 5.4 10*3/uL (ref 4.5–10.5)

## 2013-06-14 LAB — COMPREHENSIVE METABOLIC PANEL
ALT: 14 U/L (ref 0–35)
AST: 19 U/L (ref 0–37)
Albumin: 4.7 g/dL (ref 3.5–5.2)
Alkaline Phosphatase: 41 U/L (ref 39–117)
BUN: 11 mg/dL (ref 6–23)
CALCIUM: 9.2 mg/dL (ref 8.4–10.5)
CHLORIDE: 107 meq/L (ref 96–112)
CO2: 26 meq/L (ref 19–32)
CREATININE: 0.6 mg/dL (ref 0.4–1.2)
GFR: 129.81 mL/min (ref >60.00)
Glucose, Bld: 71 mg/dL (ref 70–99)
Potassium: 3.9 mEq/L (ref 3.5–5.1)
Sodium: 139 mEq/L (ref 135–145)
Total Bilirubin: 0.9 mg/dL (ref 0.3–1.2)
Total Protein: 7.7 g/dL (ref 6.0–8.3)

## 2013-06-14 LAB — LIPID PANEL
CHOL/HDL RATIO: 3
Cholesterol: 175 mg/dL (ref 0–200)
HDL: 53.8 mg/dL (ref >39.00)
LDL Cholesterol: 110 mg/dL — ABNORMAL HIGH (ref 0–99)
TRIGLYCERIDES: 57 mg/dL (ref 0.0–149.0)
VLDL: 11.4 mg/dL (ref 0.0–40.0)

## 2013-06-14 LAB — TSH: TSH: 0.71 u[IU]/mL (ref 0.35–5.50)

## 2013-06-14 NOTE — Patient Instructions (Signed)
Our office will call you to discuss lab results. Discuss post-natal concerns with obstetrician as you consider having another baby. Start pre-natal vitamins or folic acid 6 weeks before planning to get pregnant.  Do shoulder exercises daily to maintain range of motion in shoulder.  Start headache journal-writing down when headaches occur & associated symptoms like nausea, light sensitivity or any other symptom you experience. Also, record foods you ate within 24 hours. This will help identify food triggers and cyclical HA that may be related to menstrual cycle. Make sure you are hydrated when you have a headache-sip fluids. Sometimes caffeine can make a headache better -cup of coffee or caffeinated soda or tea. Excedrine migraine may work well as it has caffeine in it and pain reliever. I want to see you in 4-6 weeks to review journal.    Preventive Care for Adults, Female A healthy lifestyle and preventive care can promote health and wellness. Preventive health guidelines for women include the following key practices.  A routine yearly physical is a good way to check with your caregiver about your health and preventive screening. It is a chance to share any concerns and updates on your health, and to receive a thorough exam.  Visit your dentist for a routine exam and preventive care every 6 months. Brush your teeth twice a day and floss once a day. Good oral hygiene prevents tooth decay and gum disease.  The frequency of eye exams is based on your age, health, family medical history, use of contact lenses, and other factors. Follow your caregiver's recommendations for frequency of eye exams.  Eat a healthy diet. Foods like vegetables, fruits, whole grains, low-fat dairy products, and lean protein foods contain the nutrients you need without too many calories. Decrease your intake of foods high in solid fats, added sugars, and salt. Eat the right amount of calories for you.Get information about a  proper diet from your caregiver, if necessary.  Regular physical exercise is one of the most important things you can do for your health. Most adults should get at least 150 minutes of moderate-intensity exercise (any activity that increases your heart rate and causes you to sweat) each week. In addition, most adults need muscle-strengthening exercises on 2 or more days a week.  Maintain a healthy weight. The body mass index (BMI) is a screening tool to identify possible weight problems. It provides an estimate of body fat based on height and weight. Your caregiver can help determine your BMI, and can help you achieve or maintain a healthy weight.For adults 20 years and older:  A BMI below 18.5 is considered underweight.  A BMI of 18.5 to 24.9 is normal.  A BMI of 25 to 29.9 is considered overweight.  A BMI of 30 and above is considered obese.  Maintain normal blood lipids and cholesterol levels by exercising and minimizing your intake of saturated fat. Eat a balanced diet with plenty of fruit and vegetables. Blood tests for lipids and cholesterol should begin at age 84 and be repeated every 5 years. If your lipid or cholesterol levels are high, you are over 50, or you are at high risk for heart disease, you may need your cholesterol levels checked more frequently.Ongoing high lipid and cholesterol levels should be treated with medicines if diet and exercise are not effective.  If you smoke, find out from your caregiver how to quit. If you do not use tobacco, do not start.  Lung cancer screening is recommended for adults aged  63 80 years who are at high risk for developing lung cancer because of a history of smoking. Yearly low-dose computed tomography (CT) is recommended for people who have at least a 30-pack-year history of smoking and are a current smoker or have quit within the past 15 years. A pack year of smoking is smoking an average of 1 pack of cigarettes a day for 1 year (for example: 1  pack a day for 30 years or 2 packs a day for 15 years). Yearly screening should continue until the smoker has stopped smoking for at least 15 years. Yearly screening should also be stopped for people who develop a health problem that would prevent them from having lung cancer treatment.  If you are pregnant, do not drink alcohol. If you are breastfeeding, be very cautious about drinking alcohol. If you are not pregnant and choose to drink alcohol, do not exceed 1 drink per day. One drink is considered to be 12 ounces (355 mL) of beer, 5 ounces (148 mL) of wine, or 1.5 ounces (44 mL) of liquor.  Avoid use of street drugs. Do not share needles with anyone. Ask for help if you need support or instructions about stopping the use of drugs.  High blood pressure causes heart disease and increases the risk of stroke. Your blood pressure should be checked at least every 1 to 2 years. Ongoing high blood pressure should be treated with medicines if weight loss and exercise are not effective.  If you are 43 to 30 years old, ask your caregiver if you should take aspirin to prevent strokes.  Diabetes screening involves taking a blood sample to check your fasting blood sugar level. This should be done once every 3 years, after age 67, if you are within normal weight and without risk factors for diabetes. Testing should be considered at a younger age or be carried out more frequently if you are overweight and have at least 1 risk factor for diabetes.  Breast cancer screening is essential preventive care for women. You should practice "breast self-awareness." This means understanding the normal appearance and feel of your breasts and may include breast self-examination. Any changes detected, no matter how small, should be reported to a caregiver. Women in their 58s and 30s should have a clinical breast exam (CBE) by a caregiver as part of a regular health exam every 1 to 3 years. After age 72, women should have a CBE  every year. Starting at age 64, women should consider having a mammography (breast X-ray test) every year. Women who have a family history of breast cancer should talk to their caregiver about genetic screening. Women at a high risk of breast cancer should talk to their caregivers about having magnetic resonance imaging (MRI) and a mammography every year.  Breast cancer gene (BRCA)-related cancer risk assessment is recommended for women who have family members with BRCA-related cancers. BRCA-related cancers include breast, ovarian, tubal, and peritoneal cancers. Having family members with these cancers may be associated with an increased risk for harmful changes (mutations) in the breast cancer genes BRCA1 and BRCA2. Results of the assessment will determine the need for genetic counseling and BRCA1 and BRCA2 testing.  The Pap test is a screening test for cervical cancer. A Pap test can show cell changes on the cervix that might become cervical cancer if left untreated. A Pap test is a procedure in which cells are obtained and examined from the lower end of the uterus (cervix).  Women should  have a Pap test starting at age 44.  Between ages 56 and 56, Pap tests should be repeated every 2 years.  Beginning at age 76, you should have a Pap test every 3 years as long as the past 3 Pap tests have been normal.  Some women have medical problems that increase the chance of getting cervical cancer. Talk to your caregiver about these problems. It is especially important to talk to your caregiver if a new problem develops soon after your last Pap test. In these cases, your caregiver may recommend more frequent screening and Pap tests.  The above recommendations are the same for women who have or have not gotten the vaccine for human papillomavirus (HPV).  If you had a hysterectomy for a problem that was not cancer or a condition that could lead to cancer, then you no longer need Pap tests. Even if you no longer  need a Pap test, a regular exam is a good idea to make sure no other problems are starting.  If you are between ages 84 and 19, and you have had normal Pap tests going back 10 years, you no longer need Pap tests. Even if you no longer need a Pap test, a regular exam is a good idea to make sure no other problems are starting.  If you have had past treatment for cervical cancer or a condition that could lead to cancer, you need Pap tests and screening for cancer for at least 20 years after your treatment.  If Pap tests have been discontinued, risk factors (such as a new sexual partner) need to be reassessed to determine if screening should be resumed.  The HPV test is an additional test that may be used for cervical cancer screening. The HPV test looks for the virus that can cause the cell changes on the cervix. The cells collected during the Pap test can be tested for HPV. The HPV test could be used to screen women aged 22 years and older, and should be used in women of any age who have unclear Pap test results. After the age of 56, women should have HPV testing at the same frequency as a Pap test.  Colorectal cancer can be detected and often prevented. Most routine colorectal cancer screening begins at the age of 76 and continues through age 73. However, your caregiver may recommend screening at an earlier age if you have risk factors for colon cancer. On a yearly basis, your caregiver may provide home test kits to check for hidden blood in the stool. Use of a small camera at the end of a tube, to directly examine the colon (sigmoidoscopy or colonoscopy), can detect the earliest forms of colorectal cancer. Talk to your caregiver about this at age 7, when routine screening begins. Direct examination of the colon should be repeated every 5 to 10 years through age 51, unless early forms of pre-cancerous polyps or small growths are found.  Hepatitis C blood testing is recommended for all people born from  [birth year] through 1967 and any individual with known risks for hepatitis C.  Practice safe sex. Use condoms and avoid high-risk sexual practices to reduce the spread of sexually transmitted infections (STIs). STIs include gonorrhea, chlamydia, syphilis, trichomonas, herpes, HPV, and human immunodeficiency virus (HIV). Herpes, HIV, and HPV are viral illnesses that have no cure. They can result in disability, cancer, and death. Sexually active women aged 3 and younger should be checked for chlamydia. Older women with new or multiple  partners should also be tested for chlamydia. Testing for other STIs is recommended if you are sexually active and at increased risk.  Osteoporosis is a disease in which the bones lose minerals and strength with aging. This can result in serious bone fractures. The risk of osteoporosis can be identified using a bone density scan. Women ages 14 and over and women at risk for fractures or osteoporosis should discuss screening with their caregivers. Ask your caregiver whether you should take a calcium supplement or vitamin D to reduce the rate of osteoporosis.  Menopause can be associated with physical symptoms and risks. Hormone replacement therapy is available to decrease symptoms and risks. You should talk to your caregiver about whether hormone replacement therapy is right for you.  Use sunscreen. Apply sunscreen liberally and repeatedly throughout the day. You should seek shade when your shadow is shorter than you. Protect yourself by wearing long sleeves, pants, a wide-brimmed hat, and sunglasses year round, whenever you are outdoors.  Once a month, do a whole body skin exam, using a mirror to look at the skin on your back. Notify your caregiver of new moles, moles that have irregular borders, moles that are larger than a pencil eraser, or moles that have changed in shape or color.  Stay current with required immunizations.  Influenza vaccine. All adults should be immunized  every year.  Tetanus, diphtheria, and acellular pertussis (Td, Tdap) vaccine. Pregnant women should receive 1 dose of Tdap vaccine during each pregnancy. The dose should be obtained regardless of the length of time since the last dose. Immunization is preferred during the 27th to 36th week of gestation. An adult who has not previously received Tdap or who does not know her vaccine status should receive 1 dose of Tdap. This initial dose should be followed by tetanus and diphtheria toxoids (Td) booster doses every 10 years. Adults with an unknown or incomplete history of completing a 3-dose immunization series with Td-containing vaccines should begin or complete a primary immunization series including a Tdap dose. Adults should receive a Td booster every 10 years.  Varicella vaccine. An adult without evidence of immunity to varicella should receive 2 doses or a second dose if she has previously received 1 dose. Pregnant females who do not have evidence of immunity should receive the first dose after pregnancy. This first dose should be obtained before leaving the health care facility. The second dose should be obtained 4 8 weeks after the first dose.  Human papillomavirus (HPV) vaccine. Females aged 64 26 years who have not received the vaccine previously should obtain the 3-dose series. The vaccine is not recommended for use in pregnant females. However, pregnancy testing is not needed before receiving a dose. If a female is found to be pregnant after receiving a dose, no treatment is needed. In that case, the remaining doses should be delayed until after the pregnancy. Immunization is recommended for any person with an immunocompromised condition through the age of 81 years if she did not get any or all doses earlier. During the 3-dose series, the second dose should be obtained 4 8 weeks after the first dose. The third dose should be obtained 24 weeks after the first dose and 16 weeks after the second  dose.  Zoster vaccine. One dose is recommended for adults aged 27 years or older unless certain conditions are present.  Measles, mumps, and rubella (MMR) vaccine. Adults born before [birth year] generally are considered immune to measles and mumps. Adults born in  1959 or later should have 1 or more doses of MMR vaccine unless there is a contraindication to the vaccine or there is laboratory evidence of immunity to each of the three diseases. A routine second dose of MMR vaccine should be obtained at least 28 days after the first dose for students attending postsecondary schools, health care workers, or international travelers. People who received inactivated measles vaccine or an unknown type of measles vaccine during 1963 1967 should receive 2 doses of MMR vaccine. People who received inactivated mumps vaccine or an unknown type of mumps vaccine before 1979 and are at high risk for mumps infection should consider immunization with 2 doses of MMR vaccine. For females of childbearing age, rubella immunity should be determined. If there is no evidence of immunity, females who are not pregnant should be vaccinated. If there is no evidence of immunity, females who are pregnant should delay immunization until after pregnancy. Unvaccinated health care workers born before [birth year] who lack laboratory evidence of measles, mumps, or rubella immunity or laboratory confirmation of disease should consider measles and mumps immunization with 2 doses of MMR vaccine or rubella immunization with 1 dose of MMR vaccine.  Pneumococcal 13-valent conjugate (PCV13) vaccine. When indicated, a person who is uncertain of her immunization history and has no record of immunization should receive the PCV13 vaccine. An adult aged 58 years or older who has certain medical conditions and has not been previously immunized should receive 1 dose of PCV13 vaccine. This PCV13 should be followed with a dose of pneumococcal polysaccharide (PPSV23) vaccine.  The PPSV23 vaccine dose should be obtained at least 8 weeks after the dose of PCV13 vaccine. An adult aged 67 years or older who has certain medical conditions and previously received 1 or more doses of PPSV23 vaccine should receive 1 dose of PCV13. The PCV13 vaccine dose should be obtained 1 or more years after the last PPSV23 vaccine dose.  Pneumococcal polysaccharide (PPSV23) vaccine. When PCV13 is also indicated, PCV13 should be obtained first. All adults aged 69 years and older should be immunized. An adult younger than age 36 years who has certain medical conditions should be immunized. Any person who resides in a nursing home or long-term care facility should be immunized. An adult smoker should be immunized. People with an immunocompromised condition and certain other conditions should receive both PCV13 and PPSV23 vaccines. People with human immunodeficiency virus (HIV) infection should be immunized as soon as possible after diagnosis. Immunization during chemotherapy or radiation therapy should be avoided. Routine use of PPSV23 vaccine is not recommended for American Indians, [place] Natives, or people younger than 65 years unless there are medical conditions that require PPSV23 vaccine. When indicated, people who have unknown immunization and have no record of immunization should receive PPSV23 vaccine. One-time revaccination 5 years after the first dose of PPSV23 is recommended for people aged 75 64 years who have chronic kidney failure, nephrotic syndrome, asplenia, or immunocompromised conditions. People who received 1 2 doses of PPSV23 before age 66 years should receive another dose of PPSV23 vaccine at age 48 years or later if at least 5 years have passed since the previous dose. Doses of PPSV23 are not needed for people immunized with PPSV23 at or after age 13 years.  Meningococcal vaccine. Adults with asplenia or persistent complement component deficiencies should receive 2 doses of quadrivalent  meningococcal conjugate (MenACWY-D) vaccine. The doses should be obtained at least 2 months apart. Microbiologists working with certain meningococcal bacteria, [employer] recruits,  people at risk during an outbreak, and people who travel to or live in countries with a high rate of meningitis should be immunized. A first-year college student up through age 38 years who is living in a residence hall should receive a dose if she did not receive a dose on or after her 16th birthday. Adults who have certain high-risk conditions should receive one or more doses of vaccine.  Hepatitis A vaccine. Adults who wish to be protected from this disease, have certain high-risk conditions, work with hepatitis A-infected animals, work in hepatitis A research labs, or travel to or work in countries with a high rate of hepatitis A should be immunized. Adults who were previously unvaccinated and who anticipate close contact with an international adoptee during the first 60 days after arrival in the [place] from a country with a high rate of hepatitis A should be immunized.  Hepatitis B vaccine. Adults who wish to be protected from this disease, have certain high-risk conditions, may be exposed to blood or other infectious body fluids, are household contacts or sex partners of hepatitis B positive people, are clients or workers in certain care facilities, or travel to or work in countries with a high rate of hepatitis B should be immunized.  Haemophilus influenzae type b (Hib) vaccine. A previously unvaccinated person with asplenia or sickle cell disease or having a scheduled splenectomy should receive 1 dose of Hib vaccine. Regardless of previous immunization, a recipient of a hematopoietic stem cell transplant should receive a 3-dose series 6 12 months after her successful transplant. Hib vaccine is not recommended for adults with HIV infection. Preventive Services / Frequency Ages 33 to 78  Blood pressure check.** /  Every 1 to 2 years.  Lipid and cholesterol check.** / Every 5 years beginning at age 25.  Clinical breast exam.** / Every 3 years for women in their 60s and 32s.  BRCA-related cancer risk assessment.** / For women who have family members with a BRCA-related cancer (breast, ovarian, tubal, or peritoneal cancers).  Pap test.** / Every 2 years from ages 32 through 39. Every 3 years starting at age 76 through age 33 or 3 with a history of 3 consecutive normal Pap tests.  HPV screening.** / Every 3 years from ages 61 through ages 46 to 76 with a history of 3 consecutive normal Pap tests.  Hepatitis C blood test.** / For any individual with known risks for hepatitis C.  Skin self-exam. / Monthly.  Influenza vaccine. / Every year.  Tetanus, diphtheria, and acellular pertussis (Tdap, Td) vaccine.** / Consult your caregiver. Pregnant women should receive 1 dose of Tdap vaccine during each pregnancy. 1 dose of Td every 10 years.  Varicella vaccine.** / Consult your caregiver. Pregnant females who do not have evidence of immunity should receive the first dose after pregnancy.  HPV vaccine. / 3 doses over 6 months, if 55 and younger. The vaccine is not recommended for use in pregnant females. However, pregnancy testing is not needed before receiving a dose.  Measles, mumps, rubella (MMR) vaccine.** / You need at least 1 dose of MMR if you were born in 1957 or later. You may also need a 2nd dose. For females of childbearing age, rubella immunity should be determined. If there is no evidence of immunity, females who are not pregnant should be vaccinated. If there is no evidence of immunity, females who are pregnant should delay immunization until after pregnancy.  Pneumococcal 13-valent conjugate (PCV13) vaccine.** / Consult  your caregiver.  Pneumococcal polysaccharide (PPSV23) vaccine.** / 1 to 2 doses if you smoke cigarettes or if you have certain conditions.  Meningococcal vaccine.** / 1 dose if  you are age 24 to 69 years and a Market researcher living in a residence hall, or have one of several medical conditions, you need to get vaccinated against meningococcal disease. You may also need additional booster doses.  Hepatitis A vaccine.** / Consult your caregiver.  Hepatitis B vaccine.** / Consult your caregiver.  Haemophilus influenzae type b (Hib) vaccine.** / Consult your caregiver.  Haemophilus influenzae type b (Hib) vaccine.** / Consult your caregiver. ** Family history and personal history of risk and conditions may change your caregiver's recommendations. Document Released: 07/19/2001 Document Revised: 09/17/2012 Document Reviewed: 10/18/2010 [facility] Patient Information 2014 [place], [place].

## 2013-06-14 NOTE — Progress Notes (Signed)
Pre visit review using our clinic review tool, if applicable. No additional management support is needed unless otherwise documented below in the visit note. 

## 2013-06-15 ENCOUNTER — Encounter: Payer: Self-pay | Admitting: Nurse Practitioner

## 2013-06-15 DIAGNOSIS — O9089 Other complications of the puerperium, not elsewhere classified: Secondary | ICD-10-CM | POA: Insufficient documentation

## 2013-06-15 DIAGNOSIS — Z8709 Personal history of other diseases of the respiratory system: Secondary | ICD-10-CM | POA: Insufficient documentation

## 2013-06-15 DIAGNOSIS — Z889 Allergy status to unspecified drugs, medicaments and biological substances status: Secondary | ICD-10-CM | POA: Insufficient documentation

## 2013-06-15 LAB — VITAMIN D 25 HYDROXY (VIT D DEFICIENCY, FRACTURES): VIT D 25 HYDROXY: 24 ng/mL — AB (ref 30–89)

## 2013-06-15 NOTE — Progress Notes (Signed)
Subjective:     Alyssa Le is a 28 y.o. female and is here for a comprehensive physical exam. She sees gynecology for woman care. The patient reports problems - frequent headaches that sometimes last several days. She reports never having had a headache prior to mirena insertion 2 years ago. 400 mg ibuprophen decreases pain, but does not provide complete relief. .  History   Social History  . Marital Status: Married    Spouse Name: N/A    Number of Children: 2  . Years of Education: N/A   Occupational History  . VET ASSIST/RECEPTIONIST     part-time   Social History Main Topics  . Smoking status: Never Smoker   . Smokeless tobacco: Not on file  . Alcohol Use: No  . Drug Use: No  . Sexual Activity: Yes    Birth Control/ Protection: IUD     Comment: mirena   Other Topics Concern  . Not on file   Social History Narrative   Ms. Spirito is from [place]. She lives with her husband, 2 small children, and mother-in-law.   She works part-time.   Health Maintenance  Topic Date Due  . Pap Smear  07/15/2001  . Tetanus/tdap  07/15/2002  . Influenza Vaccine  01/04/2013    The following portions of the patient's history were reviewed and updated as appropriate: allergies, current medications, past family history, past medical history, past social history, past surgical history and problem list.  Review of Systems Constitutional: negative for chills, fatigue and fevers Eyes: positive for contacts/glasses Ears, nose, mouth, throat, and face: negative for nasal congestion, sore throat and no itchy ears or throat Respiratory: positive for exercise & allergy induced asthma. Had pleural edema after surgical procedure., negative for cough and wheezing Cardiovascular: negative for chest pain, irregular heart beat, lower extremity edema and near-syncope Gastrointestinal: negative for abdominal pain, constipation, diarrhea and dyspepsia Genitourinary:negative, has mirena X 2 yrs, no  menses, considering another pregnancy. Integument/breast: negative, gives history of hives in response to foods, peeling rash after taking keflex. Musculoskeletal:positive for R shoulder pain & decreased ROM secondary to childhood humerus infection, followed by bone lengthening surgery. Neurological: positive for headaches, negative for coordination problems, dizziness, seizures, tremors and weakness Behavioral/Psych: negative for anxiety, decreased appetite, depression, excessive alcohol consumption, illegal drug usage, increased appetite, sleep disturbance and tobacco use Allergic/Immunologic: reports multiple allergies: tree pollens, foods. Reaction: hives, throat swelling   Objective:    BP 118/68  Pulse 74  Temp(Src) 98.8 F (37.1 C) (Temporal)  Resp 16  Ht 5\' 6"  (1.676 m)  Wt 124 lb (56.246 kg)  BMI 20.02 kg/m2  SpO2 100% General appearance: alert, cooperative, appears stated age and no distress Head: Normocephalic, without obvious abnormality, atraumatic Eyes: negative findings: lids and lashes normal, conjunctivae and sclerae normal, corneas clear and pupils equal, round, reactive to light and accomodation Ears: normal TM's and external ear canals both ears Nose: Nares normal. Septum midline. Mucosa normal. No drainage or sinus tenderness. Throat: lips, mucosa, and tongue normal; teeth and gums normal Neck: no adenopathy, no carotid bruit, supple, symmetrical, trachea midline and thyroid not enlarged, symmetric, no tenderness/mass/nodules Back: symmetric, no curvature. ROM normal. No CVA tenderness. Lungs: clear to auscultation bilaterally Heart: regular rate and rhythm, S1, S2 normal, no murmur, click, rub or gallop Abdomen: soft, non-tender; bowel sounds normal; no masses,  no organomegaly Extremities: R humerus shorter than L, ROM R shoulder limited to 45 degrees Pulses: 2+ and symmetric Skin: Skin  color, texture, turgor normal. No rashes or lesions Lymph nodes: Cervical,  supraclavicular, and axillary nodes normal. Neurologic: Alert and oriented X 3, normal strength and tone. Normal symmetric reflexes. Normal coordination and gait    Assessment:    1 preventive care: woman care at gyn- 2011 pap nml, screening labs today, declines flu vaccine, healthy BMI 2 frequent headaches: DD-food allergies, dehydration, hormonal 3 hx of post-natal complications-fever for 3 mos after last child. DD: infection, Auto immune? 4 Decreased ROM R shoulder, secondary to childhood infection & surgery   Plan:    1 monitor labs, encourage healthy diet & daily exercise 2 HA journal, hydration, caffeine & f/u 1 mo. See pt instructions.  3 Discuss concerns w/OB 4 Recommend daily shoulder stretches w/exercise band to maintain current ROM See After Visit Summary for Counseling Recommendations

## 2013-06-17 ENCOUNTER — Telehealth: Payer: Self-pay | Admitting: Nurse Practitioner

## 2013-06-17 NOTE — Telephone Encounter (Signed)
Patient notified of results.

## 2013-06-17 NOTE — Telephone Encounter (Signed)
Cbc, cmet, tsh, & lipids nml. Vit D low: 24. Recmd 5000 iu qd D3. F/u 6 mos. To recheck level.

## 2013-06-17 NOTE — Telephone Encounter (Signed)
LMOVM for patient to return call concerning lab results.  

## 2013-06-28 ENCOUNTER — Ambulatory Visit (INDEPENDENT_AMBULATORY_CARE_PROVIDER_SITE_OTHER): Payer: BC Managed Care – PPO | Admitting: Nurse Practitioner

## 2013-06-28 VITALS — BP 121/76 | HR 75 | Temp 98.8°F | Resp 16 | Ht 66.0 in | Wt 124.0 lb

## 2013-06-28 DIAGNOSIS — J209 Acute bronchitis, unspecified: Secondary | ICD-10-CM

## 2013-06-28 MED ORDER — ALBUTEROL SULFATE HFA 108 (90 BASE) MCG/ACT IN AERS: INHALATION_SPRAY | RESPIRATORY_TRACT | Status: DC

## 2013-06-28 MED ORDER — BENZONATATE 100 MG PO CAPS: ORAL_CAPSULE | ORAL | Status: DC

## 2013-06-28 NOTE — Patient Instructions (Addendum)
You have a viral illness that has caused sinusitis & bronchitis. Because you have a history of asthma, I want you to start using inhaler twice daily for several days, then as needed. Also use benzonatate capsules as needed for cough. For nasal congestion, start daily sinus rinses (Neilmed Sinus rinse). You may also use pseudoephedrine 30 -60 mg twice daily for 4-6 days. For sore throat use benzocaine throat lozenges. Please call for re-evaluation if you are not improving or develop chest pain with inspiration or fever.  Acute Bronchitis Bronchitis is inflammation of the airways that extend from the windpipe into the lungs (bronchi). The inflammation often causes mucus to develop. This leads to a cough, which is the most common symptom of bronchitis.  In acute bronchitis, the condition usually develops suddenly and goes away over time, usually in a couple weeks. Smoking, allergies, and asthma can make bronchitis worse. Repeated episodes of bronchitis may cause further lung problems.  CAUSES Acute bronchitis is most often caused by the same virus that causes a cold. The virus can spread from person to person (contagious).  SIGNS AND SYMPTOMS   Cough.   Fever.   Coughing up mucus.   Body aches.   Chest congestion.   Chills.   Shortness of breath.   Sore throat.  DIAGNOSIS  Acute bronchitis is usually diagnosed through a physical exam. Tests, such as chest X-rays, are sometimes done to rule out other conditions.  TREATMENT  Acute bronchitis usually goes away in a couple weeks. Often times, no medical treatment is necessary. Medicines are sometimes given for relief of fever or cough. Antibiotics are usually not needed but may be prescribed in certain situations. In some cases, an inhaler may be recommended to help reduce shortness of breath and control the cough. A cool mist vaporizer may also be used to help thin bronchial secretions and make it easier to clear the chest.  HOME CARE  INSTRUCTIONS  Get plenty of rest.   Drink enough fluids to keep your urine clear or pale yellow (unless you have a medical condition that requires fluid restriction). Increasing fluids may help thin your secretions and will prevent dehydration.   Only take over-the-counter or prescription medicines as directed by your health care provider.   Avoid smoking and secondhand smoke. Exposure to cigarette smoke or irritating chemicals will make bronchitis worse. If you are a smoker, consider using nicotine gum or skin patches to help control withdrawal symptoms. Quitting smoking will help your lungs heal faster.   Reduce the chances of another bout of acute bronchitis by washing your hands frequently, avoiding people with cold symptoms, and trying not to touch your hands to your mouth, nose, or eyes.   Follow up with your health care provider as directed.  SEEK MEDICAL CARE IF: Your symptoms do not improve after 1 week of treatment.  SEEK IMMEDIATE MEDICAL CARE IF:  You develop an increased fever or chills.   You have chest pain.   You have severe shortness of breath.  You have bloody sputum.   You develop dehydration.  You develop fainting.  You develop repeated vomiting.  You develop a severe headache. MAKE SURE YOU:   Understand these instructions.  Will watch your condition.  Will get help right away if you are not doing well or get worse. Document Released: 06/30/2004 Document Revised: 01/23/2013 Document Reviewed: 11/13/2012 [facility] Patient Information 2014 [place], [name].  Sinusitis Sinusitis is redness, soreness, and swelling (inflammation) of the paranasal sinuses. Paranasal sinuses  are air pockets within the bones of your face (beneath the eyes, the middle of the forehead, or above the eyes). In healthy paranasal sinuses, mucus is able to drain out, and air is able to circulate through them by way of your nose. However, when your paranasal sinuses are  inflamed, mucus and air can become trapped. This can allow bacteria and other germs to grow and cause infection. Sinusitis can develop quickly and last only a short time (acute) or continue over a long period (chronic). Sinusitis that lasts for more than 12 weeks is considered chronic.  CAUSES  Causes of sinusitis include:  Allergies.  Structural abnormalities, such as displacement of the cartilage that separates your nostrils (deviated septum), which can decrease the air flow through your nose and sinuses and affect sinus drainage.  Functional abnormalities, such as when the small hairs (cilia) that line your sinuses and help remove mucus do not work properly or are not present. SYMPTOMS  Symptoms of acute and chronic sinusitis are the same. The primary symptoms are pain and pressure around the affected sinuses. Other symptoms include:  Upper toothache.  Earache.  Headache.  Bad breath.  Decreased sense of smell and taste.  A cough, which worsens when you are lying flat.  Fatigue.  Fever.  Thick drainage from your nose, which often is green and may contain pus (purulent).  Swelling and warmth over the affected sinuses. DIAGNOSIS  Your caregiver will perform a physical exam. During the exam, your caregiver may:  Look in your nose for signs of abnormal growths in your nostrils (nasal polyps).  Tap over the affected sinus to check for signs of infection.  View the inside of your sinuses (endoscopy) with a special imaging device with a light attached (endoscope), which is inserted into your sinuses. If your caregiver suspects that you have chronic sinusitis, one or more of the following tests may be recommended:  Allergy tests.  Nasal culture A sample of mucus is taken from your nose and sent to a lab and screened for bacteria.  Nasal cytology A sample of mucus is taken from your nose and examined by your caregiver to determine if your sinusitis is related to an  allergy. TREATMENT  Most cases of acute sinusitis are related to a viral infection and will resolve on their own within 10 days. Sometimes medicines are prescribed to help relieve symptoms (pain medicine, decongestants, nasal steroid sprays, or saline sprays).  However, for sinusitis related to a bacterial infection, your caregiver will prescribe antibiotic medicines. These are medicines that will help kill the bacteria causing the infection.  Rarely, sinusitis is caused by a fungal infection. In theses cases, your caregiver will prescribe antifungal medicine. For some cases of chronic sinusitis, surgery is needed. Generally, these are cases in which sinusitis recurs more than 3 times per year, despite other treatments. HOME CARE INSTRUCTIONS   Drink plenty of water. Water helps thin the mucus so your sinuses can drain more easily.  Use a humidifier.  Inhale steam 3 to 4 times a day (for example, sit in the bathroom with the shower running).  Apply a warm, moist washcloth to your face 3 to 4 times a day, or as directed by your caregiver.  Use saline nasal sprays to help moisten and clean your sinuses.  Take over-the-counter or prescription medicines for pain, discomfort, or fever only as directed by your caregiver. SEEK IMMEDIATE MEDICAL CARE IF:  You have increasing pain or severe headaches.  You have  nausea, vomiting, or drowsiness.  You have swelling around your face.  You have vision problems.  You have a stiff neck.  You have difficulty breathing. MAKE SURE YOU:   Understand these instructions.  Will watch your condition.  Will get help right away if you are not doing well or get worse. Document Released: 05/23/2005 Document Revised: 08/15/2011 Document Reviewed: 06/07/2011 [facility] Patient Information 2014 [place], [name].

## 2013-06-28 NOTE — Progress Notes (Signed)
Pre visit review using our clinic review tool, if applicable. No additional management support is needed unless otherwise documented below in the visit note. 

## 2013-06-28 NOTE — Progress Notes (Signed)
   Subjective:    Patient ID: Alyssa Le, female    DOB: 07-14-1985, 28 y.o.   MRN: [id number]  Cough This is a new problem. The current episode started 1 to 4 weeks ago (1 WK). The problem has been gradually worsening. The problem occurs every few minutes. The cough is productive of sputum. Associated symptoms include headaches, nasal congestion, postnasal drip and a sore throat. Pertinent negatives include no chest pain, chills, ear congestion, ear pain, fever, shortness of breath, weight loss or wheezing. Nothing aggravates the symptoms. Treatments tried: otc COLD MEDS. The treatment provided no relief. Her past medical history is significant for asthma and pneumonia.      Review of Systems  Constitutional: Positive for fatigue. Negative for fever, chills and weight loss.  HENT: Positive for congestion, postnasal drip and sore throat. Negative for ear pain.   Respiratory: Positive for cough. Negative for chest tightness, shortness of breath and wheezing.   Cardiovascular: Negative for chest pain.  Gastrointestinal: Positive for nausea. Negative for abdominal pain.  Musculoskeletal: Negative for back pain.  Neurological: Positive for headaches.  Hematological: Negative for adenopathy.       Objective:   Physical Exam  Vitals reviewed. Constitutional: She is oriented to person, place, and time. She appears well-developed and well-nourished. No distress.  HENT:  Head: Normocephalic and atraumatic.  Right Ear: External ear normal.  Left Ear: External ear normal.  Mouth/Throat: Oropharynx is clear and moist. No oropharyngeal exudate.  EFFUSION r tm   Eyes: Conjunctivae are normal. Right eye exhibits no discharge. Left eye exhibits no discharge.  Neck: Normal range of motion. Neck supple. No thyromegaly present.  Cardiovascular: Normal rate, regular rhythm and normal heart sounds.   No murmur heard. Pulmonary/Chest: Effort normal and breath sounds normal. No respiratory distress.  She has no wheezes.  Frequent coughing during exam   Lymphadenopathy:    She has no cervical adenopathy.  Neurological: She is alert and oriented to person, place, and time.  Skin: Skin is warm and dry.  Psychiatric: She has a normal mood and affect. Her behavior is normal. Thought content normal.          Assessment & Plan:  1. Acute bronchitis Hx asthma - albuterol (PROVENTIL HFA;VENTOLIN HFA) 108 (90 BASE) MCG/ACT inhaler; Use 2 puffs twice daily for 4-6 days, then PRN q6h cough.  Dispense: 1 Inhaler; Refill: 0 - benzonatate (TESSALON) 100 MG capsule; Take 1-2 capsules po up to 3 times daily PRN cough  Dispense: 60 capsule; Refill: 0 See pt instructions.

## 2013-07-03 ENCOUNTER — Ambulatory Visit (INDEPENDENT_AMBULATORY_CARE_PROVIDER_SITE_OTHER): Payer: BC Managed Care – PPO | Admitting: Nurse Practitioner

## 2013-07-03 ENCOUNTER — Encounter: Payer: Self-pay | Admitting: Nurse Practitioner

## 2013-07-03 VITALS — BP 132/82 | HR 109 | Temp 100.4°F | Resp 18 | Ht 66.0 in | Wt 124.0 lb

## 2013-07-03 DIAGNOSIS — A499 Bacterial infection, unspecified: Secondary | ICD-10-CM

## 2013-07-03 DIAGNOSIS — B9689 Other specified bacterial agents as the cause of diseases classified elsewhere: Secondary | ICD-10-CM

## 2013-07-03 DIAGNOSIS — J329 Chronic sinusitis, unspecified: Secondary | ICD-10-CM

## 2013-07-03 DIAGNOSIS — R059 Cough, unspecified: Secondary | ICD-10-CM

## 2013-07-03 DIAGNOSIS — R05 Cough: Secondary | ICD-10-CM

## 2013-07-03 MED ORDER — GUAIFENESIN 400 MG PO TABS: ORAL_TABLET | ORAL | Status: DC

## 2013-07-03 MED ORDER — HYDROCODONE-HOMATROPINE 5-1.5 MG/5ML PO SYRP: 5.0000 mL | ORAL_SOLUTION | Freq: Every evening | ORAL | Status: DC | PRN

## 2013-07-03 MED ORDER — DOXYCYCLINE HYCLATE 100 MG PO TABS: 100.0000 mg | ORAL_TABLET | Freq: Two times a day (BID) | ORAL | Status: DC

## 2013-07-03 NOTE — Patient Instructions (Signed)
I am treating you for bacterial sinusitis. You may have pneumonia as well. The medication will treat both. Eat yogurt in middle of day while taking medicine. Use cough syrup to help you rest. Take guaifenesen to help loosen mucous. Continue sinus rinses. Alternate tylenol & ibuprophen for fever. Please let me know if you are still running fever Monday, otherwise I will see you in 2 weeks for follow up.  Sinusitis Sinusitis is redness, soreness, and swelling (inflammation) of the paranasal sinuses. Paranasal sinuses are air pockets within the bones of your face (beneath the eyes, the middle of the forehead, or above the eyes). In healthy paranasal sinuses, mucus is able to drain out, and air is able to circulate through them by way of your nose. However, when your paranasal sinuses are inflamed, mucus and air can become trapped. This can allow bacteria and other germs to grow and cause infection. Sinusitis can develop quickly and last only a short time (acute) or continue over a long period (chronic). Sinusitis that lasts for more than 12 weeks is considered chronic.  CAUSES  Causes of sinusitis include:  Allergies.  Structural abnormalities, such as displacement of the cartilage that separates your nostrils (deviated septum), which can decrease the air flow through your nose and sinuses and affect sinus drainage.  Functional abnormalities, such as when the small hairs (cilia) that line your sinuses and help remove mucus do not work properly or are not present. SYMPTOMS  Symptoms of acute and chronic sinusitis are the same. The primary symptoms are pain and pressure around the affected sinuses. Other symptoms include:  Upper toothache.  Earache.  Headache.  Bad breath.  Decreased sense of smell and taste.  A cough, which worsens when you are lying flat.  Fatigue.  Fever.  Thick drainage from your nose, which often is green and may contain pus (purulent).  Swelling and warmth over the  affected sinuses. DIAGNOSIS  Your caregiver will perform a physical exam. During the exam, your caregiver may:  Look in your nose for signs of abnormal growths in your nostrils (nasal polyps).  Tap over the affected sinus to check for signs of infection.  View the inside of your sinuses (endoscopy) with a special imaging device with a light attached (endoscope), which is inserted into your sinuses. If your caregiver suspects that you have chronic sinusitis, one or more of the following tests may be recommended:  Allergy tests.  Nasal culture A sample of mucus is taken from your nose and sent to a lab and screened for bacteria.  Nasal cytology A sample of mucus is taken from your nose and examined by your caregiver to determine if your sinusitis is related to an allergy. TREATMENT  Most cases of acute sinusitis are related to a viral infection and will resolve on their own within 10 days. Sometimes medicines are prescribed to help relieve symptoms (pain medicine, decongestants, nasal steroid sprays, or saline sprays).  However, for sinusitis related to a bacterial infection, your caregiver will prescribe antibiotic medicines. These are medicines that will help kill the bacteria causing the infection.  Rarely, sinusitis is caused by a fungal infection. In theses cases, your caregiver will prescribe antifungal medicine. For some cases of chronic sinusitis, surgery is needed. Generally, these are cases in which sinusitis recurs more than 3 times per year, despite other treatments. HOME CARE INSTRUCTIONS   Drink plenty of water. Water helps thin the mucus so your sinuses can drain more easily.  Use a humidifier.  Inhale steam 3 to 4 times a day (for example, sit in the bathroom with the shower running).  Apply a warm, moist washcloth to your face 3 to 4 times a day, or as directed by your caregiver.  Use saline nasal sprays to help moisten and clean your sinuses.  Take over-the-counter or  prescription medicines for pain, discomfort, or fever only as directed by your caregiver. SEEK IMMEDIATE MEDICAL CARE IF:  You have increasing pain or severe headaches.  You have nausea, vomiting, or drowsiness.  You have swelling around your face.  You have vision problems.  You have a stiff neck.  You have difficulty breathing. MAKE SURE YOU:   Understand these instructions.  Will watch your condition.  Will get help right away if you are not doing well or get worse. Document Released: 05/23/2005 Document Revised: 08/15/2011 Document Reviewed: 06/07/2011 [facility] Patient Information 2014 [place], [name].

## 2013-07-03 NOTE — Progress Notes (Signed)
   Subjective:    Patient ID: Alyssa Le, female    DOB: 24-Nov-1985, 28 y.o.   MRN: [id number]  Fever  This is a new problem. The current episode started today. The problem occurs constantly. The problem has been gradually worsening (still coughing, nasal congestion, body aches). The maximum temperature noted was 100 to 100.9 F. Associated symptoms include chest pain (sternal, burning. started yesterday), congestion, coughing, headaches and muscle aches. Pertinent negatives include no abdominal pain, diarrhea, ear pain, nausea, rash, sore throat, vomiting or wheezing. She has tried NSAIDs (sinus rinses, benzonatate capsules, inhaler-not helpful) for the symptoms. The treatment provided no relief.      Review of Systems  Constitutional: Positive for fever, chills and fatigue. Negative for activity change and appetite change.  HENT: Positive for congestion, postnasal drip and sinus pressure. Negative for ear pain and sore throat.   Respiratory: Positive for cough. Negative for chest tightness, shortness of breath and wheezing.   Cardiovascular: Positive for chest pain (sternal, burning. started yesterday).  Gastrointestinal: Negative for nausea, vomiting, abdominal pain and diarrhea.  Musculoskeletal: Positive for myalgias. Negative for back pain.  Skin: Negative for rash.  Neurological: Positive for headaches.       Objective:   Physical Exam  Vitals reviewed. Constitutional: She is oriented to person, place, and time. She appears well-developed and well-nourished. No distress.  HENT:  Head: Normocephalic and atraumatic.  Right Ear: External ear normal.  Left Ear: External ear normal.  Mouth/Throat: Oropharynx is clear and moist. No oropharyngeal exudate.  Unable to open mouth wide due to TMJ. Jaw popped when opened wide.  Eyes: Right eye exhibits no discharge. Left eye exhibits no discharge.  Conjunctiva mildly injected  Neck: Normal range of motion. Neck supple. No thyromegaly  present.  Cardiovascular: Normal rate.   Pulmonary/Chest: Effort normal and breath sounds normal. No respiratory distress. She has no wheezes.  Poor exam due to frequent coughing.  Lymphadenopathy:    She has no cervical adenopathy.  Neurological: She is alert and oriented to person, place, and time.  Skin: Skin is warm and dry.  Psychiatric: She has a normal mood and affect. Her behavior is normal. Thought content normal.          Assessment & Plan:  1. Bacterial sinusitis 2 week duration. Fever, nasal congestion. - doxycycline (VIBRA-TABS) 100 MG tablet; Take 1 tablet (100 mg total) by mouth 2 (two) times daily.  Dispense: 28 tablet; Refill: 0   2. Cough May have pneumonia. Fever today. Burning sternal CP w/coughing & exhalation. - HYDROcodone-homatropine (HYCODAN) 5-1.5 MG/5ML syrup; Take 5 mLs by mouth at bedtime as needed for cough.  Dispense: 120 mL; Refill: 0 - guaifenesin (HUMIBID E) 400 MG TABS tablet; Take 1t po q8h for 5 days then q8h PRN chest congestion  Dispense: 56 tablet; Refill: 0  F/u 2 weeks. Consider doing AI screen as pt states she has hard time fighting illness. Hair looks like it is cycling-lots of new hair growing in. Pt states hair has thinned in last few years.

## 2013-07-19 ENCOUNTER — Ambulatory Visit (INDEPENDENT_AMBULATORY_CARE_PROVIDER_SITE_OTHER): Payer: BC Managed Care – PPO | Admitting: Nurse Practitioner

## 2013-07-19 ENCOUNTER — Encounter: Payer: Self-pay | Admitting: Nurse Practitioner

## 2013-07-19 VITALS — BP 92/60 | HR 77 | Temp 98.8°F | Ht 66.0 in | Wt 126.0 lb

## 2013-07-19 DIAGNOSIS — Z9109 Other allergy status, other than to drugs and biological substances: Secondary | ICD-10-CM

## 2013-07-19 DIAGNOSIS — G44209 Tension-type headache, unspecified, not intractable: Secondary | ICD-10-CM

## 2013-07-19 DIAGNOSIS — J989 Respiratory disorder, unspecified: Secondary | ICD-10-CM

## 2013-07-19 NOTE — Progress Notes (Signed)
Subjective:     Alyssa Le is a 28 y.o. female. She present for follow up of recent spiratory illness, headaches, and allergies. Regarding respiratory illness, she is feeling much better, energy level has returned to normal, no longer coughing, still has some post-nasal drip that causes her to clear throat often. Regarding HA, she is having fewer HA than when she first presented a few mos ago. She has not started HA journal due to recent illness. She describes HA as tension w/ pain behind eyes and jaw. She mentions a nervous habit of flexing jaw & reports Hx of "jaw locking". Re allergies: she has tried many meds in past w/minimal relief, but states allergies have gotten better since having children. She has never used daily sinus rinses or nasal antihistamine.   The following portions of the patient's history were reviewed and updated as appropriate: allergies, current medications, past medical history, past social history, past surgical history and problem list.  Review of Systems Constitutional: negative for chills, fatigue and fevers, . Ears, nose, mouth, throat, and face: negative for hoarseness, sore throat and positive for scant nasal drainage-causing her to clear throat frequently. Respiratory: negative for cough, sputum and wheezing Neurological: positive for headaches and less frequency, describes pain as behind eyes &bilat jaw Allergic/Immunologic: positive for Hx allergies unrelieved by systemic meds & flonase.    Objective:    BP 92/60  Pulse 77  Temp(Src) 98.8 F (37.1 C) (Temporal)  Ht 5\' 6"  (1.676 m)  Wt 126 lb (57.153 kg)  BMI 20.35 kg/m2  SpO2 100% BP 92/60  Pulse 77  Temp(Src) 98.8 F (37.1 C) (Temporal)  Ht 5\' 6"  (1.676 m)  Wt 126 lb (57.153 kg)  BMI 20.35 kg/m2  SpO2 100% General appearance: alert, cooperative, appears stated age and no distress Head: Normocephalic, without obvious abnormality, atraumatic Eyes: negative findings: lids and lashes normal and  conjunctivae and sclerae normal Ears: normal TM's and external ear canals both ears Nose: Nares normal. Septum midline. Mucosa normal. No drainage or sinus tenderness. Throat: lips, mucosa, and tongue normal; teeth and gums normal Lungs: clear to auscultation bilaterally Heart: regular rate and rhythm, S1, S2 normal, no murmur, click, rub or gallop Lymph nodes: no cervical LAD    Assessment:   1 resp illness. Improved: cough resolved, energy back to nml, no chest congestion. Still having mild postnasal drip 2 HA, jaw pain & pain behind eyes 3 environmental allergies, improved in last 2 years, but persistent, systemic meds & flonase minimally helpful    Plan:     1 continue daily sinus rinses 2 HA journal, moist heat on jaw, head & neck stretches, offered muscle relaxer-pt declined. Pt will sched appt if unable to ID HA triggers. 3 daily sinus rinses, consider nasal antihistamine starting 2 weeks prior to March-pt will let know if wants to try.

## 2013-07-19 NOTE — Patient Instructions (Signed)
So glad you are better! Continue sinus rinses to help clear up post-nasal drip and consider using daily throughout allergy season.  Consider nasal antihistamine. Use moist heat on jaw (sock filled with dry rice, put in microwave for 1-2 minutes) several times daily, movement, gentle massage. Start headache journal for at least 4 weeks, 8 weeks is best call office if you cannot identify triggers (food/activity/sleep/stress).  Stress headache can sometimes be relieved by head massage, neck & shoulder stretches; and moist heat on neck, jaw, sides & back of head.  Tension Headache A tension headache is a feeling of pain, pressure, or aching often felt over the front and sides of the head. The pain can be dull or can feel tight (constricting). It is the most common type of headache. Tension headaches are not normally associated with nausea or vomiting and do not get worse with physical activity. Tension headaches can last 30 minutes to several days.  CAUSES  The exact cause is not known, but it may be caused by chemicals and hormones in the brain that lead to pain. Tension headaches often begin after stress, anxiety, or depression. Other triggers may include:  Alcohol.  Caffeine (too much or withdrawal).  Respiratory infections (colds, flu, sinus infections).  Dental problems or teeth clenching.  Fatigue.  Holding your head and neck in one position too long while using a computer. SYMPTOMS   Pressure around the head.   Dull, aching head pain.   Pain felt over the front and sides of the head.   Tenderness in the muscles of the head, neck, and shoulders. DIAGNOSIS  A tension headache is often diagnosed based on:   Symptoms.   Physical examination.   A CT scan or MRI of your head. These tests may be ordered if symptoms are severe or unusual. TREATMENT  Medicines may be given to help relieve symptoms.  HOME CARE INSTRUCTIONS   Only take over-the-counter or prescription medicines  for pain or discomfort as directed by your caregiver.   Lie down in a dark, quiet room when you have a headache.   Keep a journal to find out what may be triggering your headaches. For example, write down:  What you eat and drink.  How much sleep you get.  Any change to your diet or medicines.  Try massage or other relaxation techniques.   Ice packs or heat applied to the head and neck can be used. Use these 3 to 4 times per day for 15 to 20 minutes each time, or as needed.   Limit stress.   Sit up straight, and do not tense your muscles.   Quit smoking if you smoke.  Limit alcohol use.  Decrease the amount of caffeine you drink, or stop drinking caffeine.  Eat and exercise regularly.  Get 7 to 9 hours of sleep, or as recommended by your caregiver.  Avoid excessive use of pain medicine as recurrent headaches can occur.  SEEK MEDICAL CARE IF:   You have problems with the medicines you were prescribed.  Your medicines do not work.  You have a change from the usual headache.  You have nausea or vomiting. SEEK IMMEDIATE MEDICAL CARE IF:   Your headache becomes severe.  You have a fever.  You have a stiff neck.  You have loss of vision.  You have muscular weakness or loss of muscle control.  You lose your balance or have trouble walking.  You feel faint or pass out.  You have severe  symptoms that are different from your first symptoms. MAKE SURE YOU:   Understand these instructions.  Will watch your condition.  Will get help right away if you are not doing well or get worse. Document Released: 05/23/2005 Document Revised: 08/15/2011 Document Reviewed: 05/13/2011 [facility] Patient Information 2014 [place], [name].

## 2013-07-19 NOTE — Progress Notes (Signed)
Pre-visit discussion using our clinic review tool. No additional management support is needed unless otherwise documented below in the visit note.  

## 2013-08-12 ENCOUNTER — Emergency Department (HOSPITAL_BASED_OUTPATIENT_CLINIC_OR_DEPARTMENT_OTHER)
Admission: EM | Admit: 2013-08-12 | Discharge: 2013-08-12 | Disposition: A | Discharge status: Home / Self Care | Payer: BC Managed Care – PPO | Attending: Emergency Medicine | Admitting: Emergency Medicine

## 2013-08-12 ENCOUNTER — Encounter (HOSPITAL_BASED_OUTPATIENT_CLINIC_OR_DEPARTMENT_OTHER): Payer: Self-pay | Admitting: Emergency Medicine

## 2013-08-12 DIAGNOSIS — R51 Headache: Secondary | ICD-10-CM | POA: Insufficient documentation

## 2013-08-12 DIAGNOSIS — R42 Dizziness and giddiness: Secondary | ICD-10-CM | POA: Insufficient documentation

## 2013-08-12 DIAGNOSIS — R110 Nausea: Secondary | ICD-10-CM | POA: Insufficient documentation

## 2013-08-12 DIAGNOSIS — R519 Headache, unspecified: Secondary | ICD-10-CM

## 2013-08-12 DIAGNOSIS — Z8742 Personal history of other diseases of the female genital tract: Secondary | ICD-10-CM | POA: Insufficient documentation

## 2013-08-12 DIAGNOSIS — Z8739 Personal history of other diseases of the musculoskeletal system and connective tissue: Secondary | ICD-10-CM | POA: Insufficient documentation

## 2013-08-12 DIAGNOSIS — Z3202 Encounter for pregnancy test, result negative: Secondary | ICD-10-CM | POA: Insufficient documentation

## 2013-08-12 DIAGNOSIS — Z91040 Latex allergy status: Secondary | ICD-10-CM | POA: Insufficient documentation

## 2013-08-12 LAB — URINALYSIS, ROUTINE W REFLEX MICROSCOPIC
Bilirubin Urine: NEGATIVE
Glucose, UA: NEGATIVE mg/dL
KETONES UR: NEGATIVE mg/dL
Leukocytes, UA: NEGATIVE
NITRITE: NEGATIVE
Protein, ur: NEGATIVE mg/dL
SPECIFIC GRAVITY, URINE: 1.022 (ref 1.005–1.030)
Urobilinogen, UA: 1 mg/dL (ref 0.0–1.0)
pH: 7.5 (ref 5.0–8.0)

## 2013-08-12 LAB — PREGNANCY, URINE: Preg Test, Ur: NEGATIVE

## 2013-08-12 LAB — URINE MICROSCOPIC-ADD ON

## 2013-08-12 MED ORDER — SODIUM CHLORIDE 0.9 % IV SOLN
INTRAVENOUS | Status: DC
  Administered 2013-08-12: 1000 mL via INTRAVENOUS

## 2013-08-12 MED ORDER — DIPHENHYDRAMINE HCL 50 MG/ML IJ SOLN
25.0000 mg | Freq: Once | INTRAMUSCULAR | Status: AC
  Administered 2013-08-12: 25 mg via INTRAVENOUS
  Filled 2013-08-12: qty 1

## 2013-08-12 MED ORDER — METOCLOPRAMIDE HCL 5 MG/ML IJ SOLN
10.0000 mg | Freq: Once | INTRAMUSCULAR | Status: AC
  Administered 2013-08-12: 10 mg via INTRAVENOUS
  Filled 2013-08-12: qty 2

## 2013-08-12 MED ORDER — DEXAMETHASONE SODIUM PHOSPHATE 10 MG/ML IJ SOLN
10.0000 mg | Freq: Once | INTRAMUSCULAR | Status: AC
  Administered 2013-08-12: 10 mg via INTRAVENOUS
  Filled 2013-08-12: qty 1

## 2013-08-12 NOTE — Discharge Instructions (Signed)
Thank you for allowing me to care for you today. Follow up with your doctor. Return here as needed for problems.

## 2013-08-12 NOTE — ED Provider Notes (Signed)
CSN: [id number]     Arrival date & time 08/12/13  1922 History   First MD Initiated Contact with Patient 08/12/13 1933     Chief Complaint  Patient presents with  . Headache     (Consider location/radiation/quality/duration/timing/severity/associated sxs/prior Treatment) Patient is a 28 y.o. female presenting with headaches. The history is provided by the patient.  Headache Pain location:  Frontal, L temporal and R temporal Radiates to:  Does not radiate Severity currently:  6/10 Severity at highest:  7/10 Onset quality:  Gradual Duration:  4 days Timing:  Constant Chronicity:  New Similar to prior headaches: yes   Context: activity, bright light and loud noise   Relieved by:  Nothing Ineffective treatments:  NSAIDs Associated symptoms: nausea   Associated symptoms: no abdominal pain, no congestion, no cough, no ear pain, no fever, no neck stiffness, no sore throat and no vomiting    Alyssa Le is a 28 y.o. female who presents to the ED with headache that has been going on for 4 days. She has nausea but no vomiting. She has been evaluated by her PCP and told to keep a diary of the food she eats but she has not started doing that yet. She has been taking Advil for her headache without relief. The headaches have become more frequent over the past couple years. Now she seems to get them every month. She is planning to talk to her GYN about her birth control and see is that may need to be change because that is when the headaches started. This headache is similar to headaches in the past and is not the worst one she has had.   Past Medical History  Diagnosis Date  . Ovarian cyst, right     surgically removed. 28 y.o.  . Allergy     multiple food & environmental allergies (tree pollens & fruits)  . Arthritis     exercise & allergy induced   Past Surgical History  Procedure Laterality Date  . Appendectomy    . 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 Adopted: Yes   History  Substance Use Topics  . Smoking status: Never Smoker   . Smokeless tobacco: Not on file  . Alcohol Use: No   OB History   Grav Para Term Preterm Abortions TAB SAB Ect Mult Living   2 2        2      Review of Systems  Constitutional: Negative for fever and chills.  HENT: Negative for congestion, ear pain and sore throat.   Eyes: Negative for visual disturbance.  Respiratory: Negative for cough and chest tightness.   Cardiovascular: Negative for chest pain.  Gastrointestinal: Positive for nausea. Negative for vomiting and abdominal pain.  Genitourinary: Negative for urgency, vaginal bleeding and vaginal discharge.  Musculoskeletal: Negative for neck stiffness.  Skin: Negative for rash.  Neurological: Positive for light-headedness and headaches.  Psychiatric/Behavioral: Negative for confusion. The patient is not nervous/anxious.       Allergies  Amoxicillin-pot clavulanate; Cephalexin; Latex; and Perineal cleansing  Home Medications   Current Outpatient Rx  Name  Route  Sig  Dispense  Refill  . albuterol (PROVENTIL HFA;VENTOLIN HFA) 108 (90 BASE) MCG/ACT inhaler      Use 2 puffs twice daily for 4-6 days, then PRN q6h cough.   1 Inhaler  0   . guaifenesin (HUMIBID E) 400 MG TABS tablet      Take 1t po q8h for 5 days then q8h PRN chest congestion   56 tablet   0   . levonorgestrel (MIRENA) 20 MCG/24HR IUD   Intrauterine   1 each by Intrauterine route once.          BP 116/72  Pulse 78  Temp(Src) 98.8 F (37.1 C) (Oral)  Resp 18  Ht 5\' 6"  (1.676 m)  Wt 123 lb (55.792 kg)  BMI 19.86 kg/m2  SpO2 100% Physical Exam  Constitutional: She is oriented to person, place, and time. She appears well-developed and well-nourished. No distress.  HENT:  Head: Normocephalic and atraumatic.  Right Ear: Tympanic membrane normal.  Left Ear: Tympanic  membrane normal.  Nose: Nose normal.  Mouth/Throat: Uvula is midline, oropharynx is clear and moist and mucous membranes are normal.  Eyes: Conjunctivae and EOM are normal.  Neck: Normal range of motion. Neck supple.  Cardiovascular: Normal rate and regular rhythm.   Pulmonary/Chest: Effort normal. She has no wheezes. She has no rales.  Abdominal: Soft. Bowel sounds are normal. There is no tenderness.  Musculoskeletal: She exhibits no edema.  Radial and pedal pulses strong, adequate circulation, good touch sensation.  Neurological: She is alert and oriented to person, place, and time. She has normal strength. No cranial nerve deficit or sensory deficit. She displays a negative Romberg sign. Gait normal.  Reflex Scores:      Bicep reflexes are 2+ on the right side and 2+ on the left side.      Brachioradialis reflexes are 2+ on the right side and 2+ on the left side.      Patellar reflexes are 2+ on the right side and 2+ on the left side.      Achilles reflexes are 2+ on the right side and 2+ on the left side. Rapid alternating movement without difficulty. Stands on one foot without difficulty.  Skin: Skin is warm and dry.  Psychiatric: She has a normal mood and affect. Her behavior is normal.    ED Course  Procedures Results for orders placed during the hospital encounter of 08/12/13 (from the past 24 hour(s))  PREGNANCY, URINE     Status: None   Collection Time    08/12/13  7:39 PM      Result Value Ref Range   Preg Test, Ur NEGATIVE  NEGATIVE  URINALYSIS, ROUTINE W REFLEX MICROSCOPIC     Status: Abnormal   Collection Time    08/12/13  8:38 PM      Result Value Ref Range   Color, Urine YELLOW  YELLOW   APPearance TURBID (*) CLEAR   Specific Gravity, Urine 1.022  1.005 - 1.030   pH 7.5  5.0 - 8.0   Glucose, UA NEGATIVE  NEGATIVE mg/dL   Hgb urine dipstick LARGE (*) NEGATIVE   Bilirubin Urine NEGATIVE  NEGATIVE   Ketones, ur NEGATIVE  NEGATIVE mg/dL   Protein, ur NEGATIVE   NEGATIVE mg/dL   Urobilinogen, UA 1.0  0.0 - 1.0 mg/dL   Nitrite NEGATIVE  NEGATIVE   Leukocytes, UA NEGATIVE  NEGATIVE  URINE MICROSCOPIC-ADD ON     Status: Abnormal   Collection Time    08/12/13  8:38 PM      Result Value Ref Range   Squamous Epithelial / LPF FEW (*) RARE   RBC / HPF 11-20  <3 RBC/hpf   Bacteria, UA RARE  RARE  Urine-Other AMORPHOUS URATES/PHOSPHATES      After IV hydration, Decadron 10 mg, Benadryl 25 mg and Reglan 10 mg IV patient is without headache or nausea.  MDM  28 y.o. female with headache that has been ongoing x 4 days. Stable for discharge with normal neuro exam and resolved headache and nausea. Discussed with the patient plan of care and all questioned fully answered. She will return if any problems arise.  Discussed hematuria and patient states that is not new and she will be going for follow up.     [address] [place], [place] 08/13/13 [phone]

## 2013-08-12 NOTE — ED Notes (Signed)
C/o ha x 4 days w dizziness and nausea  Denies blurred vision,  Has been seen by pcp for same,  Want here to keep list of foods eaten,  Only change noted started vit d 2-3 days ago

## 2013-08-12 NOTE — ED Notes (Signed)
Pt complains of headache x 4 days.  Seen PCP but wanted her to track what she was eating.  Complains of increased dizziness.  Denies n/v.

## 2013-08-14 NOTE — ED Provider Notes (Signed)
Medical screening examination/treatment/procedure(s) were performed by non-physician practitioner and as supervising physician I was immediately available for consultation/collaboration.   EKG Interpretation None        Chandlor Noecker N Misao Fackrell, DO 08/14/13 0706 

## 2013-08-15 ENCOUNTER — Ambulatory Visit (INDEPENDENT_AMBULATORY_CARE_PROVIDER_SITE_OTHER): Payer: BC Managed Care – PPO | Admitting: Nurse Practitioner

## 2013-08-15 VITALS — BP 92/61 | HR 91 | Temp 98.4°F | Wt 122.0 lb

## 2013-08-15 DIAGNOSIS — G43909 Migraine, unspecified, not intractable, without status migrainosus: Secondary | ICD-10-CM | POA: Insufficient documentation

## 2013-08-15 DIAGNOSIS — R110 Nausea: Secondary | ICD-10-CM

## 2013-08-15 MED ORDER — SUMATRIPTAN SUCCINATE 50 MG PO TABS: ORAL_TABLET | ORAL | Status: DC

## 2013-08-15 MED ORDER — ONDANSETRON HCL 4 MG PO TABS: ORAL_TABLET | ORAL | Status: DC

## 2013-08-15 NOTE — Progress Notes (Signed)
   Subjective:    Patient ID: Alyssa Le, female    DOB: 1986-01-16, 28 y.o.   MRN: [id number]  Headache  This is a recurrent (reports 1 MHA/mo that lasts 3-4 days) problem. The current episode started 1 to 4 weeks ago (9da). The problem occurs constantly. The problem has been waxing and waning (mod to severe HA 3da that lasted 4d. went to ER-received decadron, benadryl, & reglan. Complete pain relief. mild started again yesterday-ocular & temporal.). The pain is located in the temporal and frontal (R jaw pain) region. The pain does not radiate. The pain quality is similar to prior headaches (none lasted as long). The quality of the pain is described as aching. The pain is mild. Associated symptoms include anorexia, dizziness, eye pain, nausea and phonophobia. Pertinent negatives include no abdominal pain, abnormal behavior, back pain, blurred vision, coughing, drainage, ear pain, eye redness, eye watering, facial sweating, fever, hearing loss, muscle aches, neck pain, photophobia, scalp tenderness, seizures, sinus pressure, sore throat, swollen glands, tingling, tinnitus, visual change, vomiting or weakness. Associated symptoms comments: Starting to experience heartburn, relieved by pepcid. . The symptoms are aggravated by noise (moving eyes). She has tried NSAIDs (decadron, benadryl, & reglan in ER. No relief from NSAIDS) for the symptoms. The treatment provided significant relief. Her past medical history is significant for migraine headaches. (Migraine started after having mirena placed. Fam Hx unknown.)      Review of Systems  Constitutional: Positive for appetite change (decreased). Negative for fever, activity change and fatigue.  HENT: Negative for congestion, ear pain, hearing loss, nosebleeds, sinus pressure, sore throat and tinnitus.   Eyes: Positive for pain. Negative for blurred vision, photophobia and redness.  Respiratory: Negative for cough.   Gastrointestinal: Positive for nausea  and anorexia. Negative for vomiting, abdominal pain and diarrhea.       Substernal burning partially relieved w/10 mg pepcid.  Musculoskeletal: Negative for back pain and neck pain.  Neurological: Positive for dizziness, light-headedness and headaches. Negative for tingling, seizures, syncope, facial asymmetry and weakness.  Hematological: Negative for adenopathy.  Psychiatric/Behavioral: Positive for decreased concentration.       Objective:   Physical Exam  Vitals reviewed. Constitutional: She is oriented to person, place, and time. She appears well-developed and well-nourished. No distress.  HENT:  Head: Normocephalic and atraumatic.  Right Ear: External ear normal.  Left Ear: External ear normal.  Mouth/Throat: Oropharynx is clear and moist. No oropharyngeal exudate.  Eyes: Conjunctivae are normal. Right eye exhibits no discharge. Left eye exhibits no discharge.  Neck: No thyromegaly present.  Cardiovascular: Normal rate.   Pulmonary/Chest: Effort normal.  Musculoskeletal:  Decreased R arm & clavicle length since childhood  Lymphadenopathy:    She has no cervical adenopathy.  Neurological: She is alert and oriented to person, place, and time. No cranial nerve deficit.  Skin: Skin is warm and dry.  Psychiatric: She has a normal mood and affect. Her behavior is normal. Thought content normal.          Assessment & Plan:  1. Migraine with nausea Possible tension overlap. Recurrent, onset after mirena placed - SUMAtriptan (IMITREX) 50 MG tablet; Take 1 to 2 T po within onset of headache. Repeat in 2 hours if necessary. Do not exceed more than 200 mg/day.  Dispense: 10 tablet; Refill: 1 - ondansetron (ZOFRAN) 4 MG tablet; Take 1 to 2 T po bid PRN nausea  Dispense: 20 tablet; Refill: 1  See pt instructions.

## 2013-08-15 NOTE — Patient Instructions (Addendum)
Start imitrex within 15 minutes onset of headache as prescribed. You may repeat every 2 hours, not to exceed 200 mg in 24 hours. You may also take ondansetron for nausea. Low chance that these meds will cause drowsiness. If HA persists in spite of this treatment, please let me know. For indigestion, Take pepcid 10 mg twice daily. Start Align daily. Eat spoonful of yogurt before you drink coddee in the mornings. Avoid ibuprophen & aleve for several weeks or until indigestion is relieved.  See you in 2 mos. Start daily sinus rinses in about 1 month to fight environmental allergies.    Migraine Headache A migraine headache is an intense, throbbing pain on one or both sides of your head. A migraine can last for 30 minutes to several hours. CAUSES  The exact cause of a migraine headache is not always known. However, a migraine may be caused when nerves in the brain become irritated and release chemicals that cause inflammation. This causes pain. Certain things may also trigger migraines, such as:  Alcohol.  Smoking.  Stress.  Menstruation.  Aged cheeses.  Foods or drinks that contain nitrates, glutamate, aspartame, or tyramine.  Lack of sleep.  Chocolate.  Caffeine.  Hunger.  Physical exertion.  Fatigue.  Medicines used to treat chest pain (nitroglycerine), birth control pills, estrogen, and some blood pressure medicines. SIGNS AND SYMPTOMS  Pain on one or both sides of your head.  Pulsating or throbbing pain.  Severe pain that prevents daily activities.  Pain that is aggravated by any physical activity.  Nausea, vomiting, or both.  Dizziness.  Pain with exposure to bright lights, loud noises, or activity.  General sensitivity to bright lights, loud noises, or smells. Before you get a migraine, you may get warning signs that a migraine is coming (aura). An aura may include:  Seeing flashing lights.  Seeing bright spots, halos, or zig-zag lines.  Having tunnel  vision or blurred vision.  Having feelings of numbness or tingling.  Having trouble talking.  Having muscle weakness. DIAGNOSIS  A migraine headache is often diagnosed based on:  Symptoms.  Physical exam.  A CT scan or MRI of your head. These imaging tests cannot diagnose migraines, but they can help rule out other causes of headaches. TREATMENT Medicines may be given for pain and nausea. Medicines can also be given to help prevent recurrent migraines.  HOME CARE INSTRUCTIONS  Only take over-the-counter or prescription medicines for pain or discomfort as directed by your health care provider. The use of long-term narcotics is not recommended.  Lie down in a dark, quiet room when you have a migraine.  Keep a journal to find out what may trigger your migraine headaches. For example, write down:  What you eat and drink.  How much sleep you get.  Any change to your diet or medicines.  Limit alcohol consumption.  Quit smoking if you smoke.  Get 7 9 hours of sleep, or as recommended by your health care provider.  Limit stress.  Keep lights dim if bright lights bother you and make your migraines worse. SEEK IMMEDIATE MEDICAL CARE IF:   Your migraine becomes severe.  You have a fever.  You have a stiff neck.  You have vision loss.  You have muscular weakness or loss of muscle control.  You start losing your balance or have trouble walking.  You feel faint or pass out.  You have severe symptoms that are different from your first symptoms. MAKE SURE YOU:  Understand these instructions.  Will watch your condition.  Will get help right away if you are not doing well or get worse. Document Released: 05/23/2005 Document Revised: 03/13/2013 Document Reviewed: 01/28/2013 [facility] Patient Information 2014 [place], [name].  Heartburn Heartburn is a painful, burning sensation in the chest. It may feel worse in certain positions, such as lying down or bending over. It  is caused by stomach acid backing up into the tube that carries food from the mouth down to the stomach (lower esophagus).  CAUSES   Large meals.  Certain foods and drinks.  Exercise.  Increased acid production.  Being overweight or obese.  Certain medicines. SYMPTOMS   Burning pain in the chest or lower throat.  Bitter taste in the mouth.  Coughing. DIAGNOSIS  If the usual treatments for heartburn do not improve your symptoms, then tests may be done to see if there is another condition present. Possible tests may include:  X-rays.  Endoscopy. This is when a tube with a light and a camera on the end is used to examine the esophagus and the stomach.  A test to measure the amount of acid in the esophagus (pH test).  A test to see if the esophagus is working properly (esophageal manometry).  Blood, breath, or stool tests to check for bacteria that cause ulcers. TREATMENT   Your caregiver may tell you to use certain over-the-counter medicines (antacids, acid reducers) for mild heartburn.  Your caregiver may prescribe medicines to decrease the acid in your stomach or protect your stomach lining.  Your caregiver may recommend certain diet changes.  For severe cases, your caregiver may recommend that the head of your bed be elevated on blocks. (Sleeping with more pillows is not an effective treatment as it only changes the position of your head and does not improve the main problem of stomach acid refluxing into the esophagus.) HOME CARE INSTRUCTIONS   Take all medicines as directed by your caregiver.  Raise the head of your bed by putting blocks under the legs if instructed to by your caregiver.  Do not exercise right after eating.  Avoid eating 2 or 3 hours before bed. Do not lie down right after eating.  Eat small meals throughout the day instead of 3 large meals.  Stop smoking if you smoke.  Maintain a healthy weight.  Identify foods and beverages that make your  symptoms worse and avoid them. Foods you may want to avoid include:  Peppers.  Chocolate.  High-fat foods, including fried foods.  Spicy foods.  Garlic and onions.  Citrus fruits, including oranges, grapefruit, lemons, and limes.  Food containing tomatoes or tomato products.  Mint.  Carbonated drinks, caffeinated drinks, and alcohol.  Vinegar. SEEK IMMEDIATE MEDICAL CARE IF:  You have severe chest pain that goes down your arm or into your jaw or neck.  You feel sweaty, dizzy, or lightheaded.  You are short of breath.  You vomit blood.  You have difficulty or pain with swallowing.  You have bloody or black, tarry stools.  You have episodes of heartburn more than 3 times a week for more than 2 weeks. MAKE SURE YOU:  Understand these instructions.  Will watch your condition.  Will get help right away if you are not doing well or get worse. Document Released: 10/09/2008 Document Revised: 08/15/2011 Document Reviewed: 11/07/2010 [name] Patient Information 2014 [place], [name].

## 2013-08-15 NOTE — Progress Notes (Signed)
Pre-visit discussion using our clinic review tool. No additional management support is needed unless otherwise documented below in the visit note.  

## 2013-08-21 ENCOUNTER — Encounter: Payer: Self-pay | Admitting: Family Medicine

## 2013-08-21 ENCOUNTER — Ambulatory Visit (INDEPENDENT_AMBULATORY_CARE_PROVIDER_SITE_OTHER): Payer: BC Managed Care – PPO | Admitting: Family Medicine

## 2013-08-21 ENCOUNTER — Telehealth: Payer: Self-pay | Admitting: Nurse Practitioner

## 2013-08-21 VITALS — BP 106/71 | HR 81 | Temp 99.7°F | Resp 18 | Ht 66.0 in | Wt 124.0 lb

## 2013-08-21 DIAGNOSIS — K209 Esophagitis, unspecified without bleeding: Secondary | ICD-10-CM

## 2013-08-21 DIAGNOSIS — K2970 Gastritis, unspecified, without bleeding: Secondary | ICD-10-CM

## 2013-08-21 DIAGNOSIS — K2990 Gastroduodenitis, unspecified, without bleeding: Secondary | ICD-10-CM

## 2013-08-21 LAB — COMPREHENSIVE METABOLIC PANEL
ALK PHOS: 47 U/L (ref 39–117)
ALT: 19 U/L (ref 0–35)
AST: 18 U/L (ref 0–37)
Albumin: 5 g/dL (ref 3.5–5.2)
BILIRUBIN TOTAL: 0.7 mg/dL (ref 0.3–1.2)
BUN: 9 mg/dL (ref 6–23)
CO2: 27 meq/L (ref 19–32)
Calcium: 9.7 mg/dL (ref 8.4–10.5)
Chloride: 101 mEq/L (ref 96–112)
Creatinine, Ser: 0.5 mg/dL (ref 0.4–1.2)
GFR: 140.79 mL/min (ref >60.00)
GLUCOSE: 75 mg/dL (ref 70–99)
Potassium: 4.1 mEq/L (ref 3.5–5.1)
Sodium: 135 mEq/L (ref 135–145)
Total Protein: 8.1 g/dL (ref 6.0–8.3)

## 2013-08-21 LAB — CBC WITH DIFFERENTIAL/PLATELET
BASOS PCT: 0.7 % (ref 0.0–3.0)
Basophils Absolute: 0 10*3/uL (ref 0.0–0.1)
EOS ABS: 0.1 10*3/uL (ref 0.0–0.7)
Eosinophils Relative: 1.3 % (ref 0.0–5.0)
HCT: 41.3 % (ref 36.0–46.0)
HEMOGLOBIN: 13.7 g/dL (ref 12.0–15.0)
Lymphocytes Relative: 31.4 % (ref 12.0–46.0)
Lymphs Abs: 1.8 10*3/uL (ref 0.7–4.0)
MCHC: 33.2 g/dL (ref 30.0–36.0)
MCV: 90.2 fl (ref 78.0–100.0)
MONO ABS: 0.4 10*3/uL (ref 0.1–1.0)
Monocytes Relative: 6.2 % (ref 3.0–12.0)
NEUTROS ABS: 3.5 10*3/uL (ref 1.4–7.7)
Neutrophils Relative %: 60.4 % (ref 43.0–77.0)
Platelets: 253 10*3/uL (ref 150.0–400.0)
RBC: 4.58 Mil/uL (ref 3.87–5.11)
RDW: 13.6 % (ref 11.5–14.6)
WBC: 5.8 10*3/uL (ref 4.5–10.5)

## 2013-08-21 LAB — H. PYLORI ANTIBODY, IGG: H Pylori IgG: NEGATIVE

## 2013-08-21 NOTE — Telephone Encounter (Signed)
Patient Information:  Caller Name: Dayne  Phone: [phone]  Patient: Alyssa Le, Alyssa Le  Gender: Female  DOB: Oct 01, 1985  Age: 28 Years  PCP: Maximino Sarin  Pregnant: No  Office Follow Up:  Does the office need to follow up with this patient?: No  Instructions For The Office: N/A  RN Note:  Mirena IUD. History of GERD; evaluated 08/15/13.  Chest burning sensation increases when takes deep breath.  Low cardiac risk per EMR.  Denies dyspnea, diaphoresis, nausea, or severe chest pain.  Disposition discussed with Nyra Capes nurse. Scheduled for office appointment since office is open vs sending pt to ED.   Symptoms  Reason For Call & Symptoms: Constant heartburn; reports burning sensation down the center of chest and burping.   Seen 08/15/13 for migraines and GERD.  Reviewed Health History In EMR: Yes  Reviewed Medications In EMR: Yes  Reviewed Allergies In EMR: Yes  Reviewed Surgeries / Procedures: Yes  Date of Onset of Symptoms: 08/01/2013  Treatments Tried: Famotidine, Prilosec, yogurt, Pepsid, smaller meals  Treatments Tried Worked: No OB / GYN:  LMP: Unknown  Guideline(s) Used:  Chest Pain  Disposition Per Guideline:   Go to ED Now (or to Office with PCP Approval)  Reason For Disposition Reached:   Chest pain lasting longer than 5 minutes  Advice Given:  Call Back If:  Severe chest pain  Difficulty breathing  Fever  You become worse.  Patient Will Follow Care Advice:  YES  Appointment Scheduled:  08/21/2013 13:15:00 Appointment Scheduled Provider:  Earley Favor [facility])

## 2013-08-21 NOTE — Progress Notes (Signed)
OFFICE NOTE  08/21/2013  CC:  Chief Complaint  Patient presents with  . Chest Pain    heartburn? couple weeks     HPI: Patient is a 28 y.o. Caucasian female who is here for chest pain.   Onset 2 wks ago in the context of a several-day migraine HA.  Describes substernal burning, some radiation around R>L chest wall.  It is present constantly. Famotidine helped a little on one occasion.  Prilosec has been tried and it doesn't help. The pain is unchanged by eating.  Taking a deep breath makes it worse, nothing makes it better. She is burping--this is new for her.  She says it is also tender beneath her xyphoid process. No changes in stool/no melena. Appetite ok.  Slight nausea occasionally.  No dysphagia.   No ST.  No cough. She does not drink alcohol or smoke.  She did take 400 mg ibuprofen bid for a few days at the onset when she was trying to treat her migraine but then discontinued this med.  She has never had an issue like this before in the past.   Pertinent PMH:  Past Medical History  Diagnosis Date  . Ovarian cyst, right     surgically removed. 28 y.o.  . Allergy     multiple food & environmental allergies (tree pollens & fruits)  . Arthritis     exercise & allergy induced   Past Surgical History  Procedure Laterality Date  . Appendectomy    . 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 Mccarey is from [place]. She lives with her husband, 2 small children, and mother-in-law.   She works part-time.    MEDS:  Outpatient Prescriptions Prior to Visit  Medication Sig Dispense Refill  . albuterol (PROVENTIL HFA;VENTOLIN HFA) 108 (90 BASE) MCG/ACT inhaler Use 2 puffs twice daily for 4-6 days, then PRN q6h cough.  1 Inhaler  0  . levonorgestrel (MIRENA) 20 MCG/24HR IUD 1 each by Intrauterine route once.      . ondansetron (ZOFRAN) 4 MG tablet Take 1 to 2 T po  bid PRN nausea  20 tablet  1  . SUMAtriptan (IMITREX) 50 MG tablet Take 1 to 2 T po within onset of headache. Repeat in 2 hours if necessary. Do not exceed more than 200 mg/day.  10 tablet  1  . guaifenesin (HUMIBID E) 400 MG TABS tablet Take 1t po q8h for 5 days then q8h PRN chest congestion  56 tablet  0   No facility-administered medications prior to visit.    PE: Blood pressure 106/71, pulse 81, temperature 99.7 F (37.6 C), temperature source Oral, resp. rate 18, height 5\' 6"  (1.676 m), weight 124 lb (56.246 kg), SpO2 98.00%. Pt examined with , CMA as chaperone. Gen: Alert, well appearing.  Patient is oriented to person, place, time, and situation. Jodelle Green: no injection, icteris, swelling, or exudate.  EOMI, PERRLA. Mouth: lips without lesion/swelling.  Oral mucosa pink and moist. Oropharynx without erythema, exudate, or swelling.  Neck - No masses or thyromegaly or limitation in range of motion CV: RRR, no m/r/g.   LUNGS: CTA bilat, nonlabored resps, good aeration in all lung fields. No chest wall tenderness. ABD: soft, moderate sub-xyphoid TTP, without mass.  No other abd tenderness. No mass or HSM.  IMPRESSION AND PLAN:  Suspect acute gastritis/esophagitis. Will start  dexilant 60mg  bid, GERD diet handout reviewed and given to pt. Check CBC, CMET, hemoccults x 3. F/u in office in 2 wks with either , NP or myself.  An After Visit Summary was printed and given to the patient.  FOLLOW UP: 2 wks

## 2013-08-21 NOTE — Progress Notes (Signed)
Pre visit review using our clinic review tool, if applicable. No additional management support is needed unless otherwise documented below in the visit note. 

## 2013-08-21 NOTE — Telephone Encounter (Signed)
Noted  

## 2013-08-28 ENCOUNTER — Ambulatory Visit: Payer: BC Managed Care – PPO | Admitting: *Deleted

## 2013-08-28 DIAGNOSIS — K209 Esophagitis, unspecified without bleeding: Secondary | ICD-10-CM

## 2013-08-28 DIAGNOSIS — K2970 Gastritis, unspecified, without bleeding: Secondary | ICD-10-CM

## 2013-08-28 LAB — HEMOCCULT SLIDES (X 3 CARDS)
Fecal Occult Blood: NEGATIVE
OCCULT 1: NEGATIVE
OCCULT 2: NEGATIVE
OCCULT 3: NEGATIVE
OCCULT 4: NEGATIVE
OCCULT 5: NEGATIVE

## 2013-08-29 MED ORDER — SUCRALFATE 1 GM/10ML PO SUSP: ORAL | Status: DC

## 2013-08-29 NOTE — Progress Notes (Signed)
Continue taking dexilant 60mg  cap twice a day. I have ordered a referral to [facility] and I sent in eRx for carafate to her pharmacy ---take as directed on instructions.-thx

## 2014-01-13 LAB — OB RESULTS CONSOLE HEPATITIS B SURFACE ANTIGEN: Hepatitis B Surface Ag: NEGATIVE

## 2014-01-13 LAB — OB RESULTS CONSOLE ANTIBODY SCREEN: ANTIBODY SCREEN: NEGATIVE

## 2014-01-13 LAB — OB RESULTS CONSOLE GC/CHLAMYDIA
Chlamydia: NEGATIVE
Gonorrhea: NEGATIVE

## 2014-01-13 LAB — OB RESULTS CONSOLE ABO/RH: RH TYPE: POSITIVE

## 2014-01-13 LAB — OB RESULTS CONSOLE RPR: RPR: NONREACTIVE

## 2014-01-13 LAB — OB RESULTS CONSOLE RUBELLA ANTIBODY, IGM: Rubella: IMMUNE

## 2014-01-13 LAB — OB RESULTS CONSOLE HIV ANTIBODY (ROUTINE TESTING): HIV: NONREACTIVE

## 2014-03-06 ENCOUNTER — Encounter (HOSPITAL_COMMUNITY): Payer: Self-pay | Admitting: *Deleted

## 2014-03-06 ENCOUNTER — Ambulatory Visit (INDEPENDENT_AMBULATORY_CARE_PROVIDER_SITE_OTHER): Payer: BC Managed Care – PPO | Admitting: Nurse Practitioner

## 2014-03-06 ENCOUNTER — Inpatient Hospital Stay (HOSPITAL_COMMUNITY)
Admission: AD | Admit: 2014-03-06 | Discharge: 2014-03-06 | Disposition: A | Discharge status: Home / Self Care | Payer: BC Managed Care – PPO | Source: Ambulatory Visit | Attending: Obstetrics and Gynecology | Admitting: Obstetrics and Gynecology

## 2014-03-06 ENCOUNTER — Encounter: Payer: Self-pay | Admitting: Nurse Practitioner

## 2014-03-06 VITALS — BP 93/60 | HR 101 | Temp 98.1°F | Ht 66.0 in | Wt 129.0 lb

## 2014-03-06 DIAGNOSIS — O26892 Other specified pregnancy related conditions, second trimester: Secondary | ICD-10-CM

## 2014-03-06 DIAGNOSIS — Z331 Pregnant state, incidental: Secondary | ICD-10-CM

## 2014-03-06 DIAGNOSIS — O99891 Other specified diseases and conditions complicating pregnancy: Secondary | ICD-10-CM

## 2014-03-06 DIAGNOSIS — N10 Acute tubulo-interstitial nephritis: Secondary | ICD-10-CM

## 2014-03-06 DIAGNOSIS — Z3A17 17 weeks gestation of pregnancy: Secondary | ICD-10-CM | POA: Diagnosis not present

## 2014-03-06 DIAGNOSIS — Z3490 Encounter for supervision of normal pregnancy, unspecified, unspecified trimester: Secondary | ICD-10-CM

## 2014-03-06 DIAGNOSIS — O9989 Other specified diseases and conditions complicating pregnancy, childbirth and the puerperium: Secondary | ICD-10-CM | POA: Diagnosis not present

## 2014-03-06 DIAGNOSIS — M549 Dorsalgia, unspecified: Secondary | ICD-10-CM | POA: Insufficient documentation

## 2014-03-06 HISTORY — DX: Headache: R51

## 2014-03-06 HISTORY — DX: Unspecified asthma, uncomplicated: J45.909

## 2014-03-06 HISTORY — DX: Headache, unspecified: R51.9

## 2014-03-06 LAB — POCT URINALYSIS DIPSTICK
Bilirubin, UA: NEGATIVE
GLUCOSE UA: NEGATIVE
KETONES UA: NEGATIVE
Leukocytes, UA: NEGATIVE
Nitrite, UA: NEGATIVE
PH UA: 6.5
Spec Grav, UA: 1.015
Urobilinogen, UA: 1

## 2014-03-06 LAB — URINALYSIS, ROUTINE W REFLEX MICROSCOPIC
Bilirubin Urine: NEGATIVE
GLUCOSE, UA: NEGATIVE mg/dL
Ketones, ur: 15 mg/dL — AB
Leukocytes, UA: NEGATIVE
Nitrite: NEGATIVE
Protein, ur: NEGATIVE mg/dL
SPECIFIC GRAVITY, URINE: 1.02 (ref 1.005–1.030)
UROBILINOGEN UA: 1 mg/dL (ref 0.0–1.0)
pH: 6 (ref 5.0–8.0)

## 2014-03-06 LAB — CBC WITH DIFFERENTIAL/PLATELET
BASOS PCT: 0 % (ref 0–1)
Basophils Absolute: 0 10*3/uL (ref 0.0–0.1)
Eosinophils Absolute: 0 10*3/uL (ref 0.0–0.7)
Eosinophils Relative: 1 % (ref 0–5)
HEMATOCRIT: 35.6 % — AB (ref 36.0–46.0)
Hemoglobin: 12.4 g/dL (ref 12.0–15.0)
LYMPHS PCT: 14 % (ref 12–46)
Lymphs Abs: 0.7 10*3/uL (ref 0.7–4.0)
MCH: 31.5 pg (ref 26.0–34.0)
MCHC: 34.8 g/dL (ref 30.0–36.0)
MCV: 90.4 fL (ref 78.0–100.0)
MONO ABS: 0.4 10*3/uL (ref 0.1–1.0)
MONOS PCT: 8 % (ref 3–12)
NEUTROS PCT: 77 % (ref 43–77)
Neutro Abs: 3.9 10*3/uL (ref 1.7–7.7)
Platelets: 184 10*3/uL (ref 150–400)
RBC: 3.94 MIL/uL (ref 3.87–5.11)
RDW: 13.1 % (ref 11.5–15.5)
WBC: 5.1 10*3/uL (ref 4.0–10.5)

## 2014-03-06 LAB — BASIC METABOLIC PANEL
Anion gap: 12 (ref 5–15)
BUN: 7 mg/dL (ref 6–23)
CHLORIDE: 101 meq/L (ref 96–112)
CO2: 23 meq/L (ref 19–32)
CREATININE: 0.52 mg/dL (ref 0.50–1.10)
Calcium: 8.6 mg/dL (ref 8.4–10.5)
GFR calc non Af Amer: >90 mL/min (ref >90)
GLUCOSE: 86 mg/dL (ref 70–99)
POTASSIUM: 3.3 meq/L — AB (ref 3.7–5.3)
Sodium: 136 mEq/L — ABNORMAL LOW (ref 137–147)

## 2014-03-06 LAB — URINE MICROSCOPIC-ADD ON

## 2014-03-06 MED ORDER — CYCLOBENZAPRINE HCL 10 MG PO TABS
10.0000 mg | ORAL_TABLET | Freq: Once | ORAL | Status: AC
  Administered 2014-03-06: 10 mg via ORAL
  Filled 2014-03-06: qty 1

## 2014-03-06 NOTE — Patient Instructions (Addendum)
Dr Ellyn Hack wants you to go to [facility] today-go to maternity admissions. Pack a bag in case you are admitted.

## 2014-03-06 NOTE — MAU Provider Note (Signed)
History     CSN: [id number]  Arrival date and time: 03/06/14 1717   None     No chief complaint on file.  Patient is a 28 y.o. female presenting with back pain. The history is provided by the patient.  Back Pain  This is a new problem. The current episode started 2 days ago. The problem occurs constantly. The problem has been gradually worsening. The pain is associated with no known injury. The pain is present in the lumbar spine. The quality of the pain is described as aching. The pain does not radiate. The pain is at a severity of 6/10. The pain is moderate. Associated symptoms include abdominal pain and abdominal swelling. Pertinent negatives include no chest pain, no fever, no bowel incontinence, no bladder incontinence and no dysuria. She has tried nothing for the symptoms. Risk factors include pregnancy.   Alyssa Le is a 28 y.o. female who presents to the MAU with back pain.  OB History   Grav Para Term Preterm Abortions TAB SAB Ect Mult Living   3 2        2       Past Medical History  Diagnosis Date  . Ovarian cyst, right     surgically removed. 28 y.o.  . Allergy     multiple food & environmental allergies (tree pollens & fruits)  . Arthritis     exercise & allergy induced    Past Surgical History  Procedure Laterality Date  . Appendectomy    . 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 Adopted: Yes    History  Substance Use Topics  . Smoking status: Never Smoker   . Smokeless tobacco: Not on file  . Alcohol Use: No    Allergies:  Allergies  Allergen Reactions  . Amoxicillin-Pot Clavulanate Other (See Comments)    Pt states that she gets chest pain and shortness of breath.  . Cephalexin Other (See Comments)    Pt states that this medication causes a peeling rash.  . Latex Rash  . Perineal Cleansing [Balneol] Rash    Prescriptions  prior to admission  Medication Sig Dispense Refill  . Prenatal Multivit-Min-Fe-FA (PRENATAL VITAMINS PO) Take 1 tablet by mouth daily.       . SUMAtriptan (IMITREX) 50 MG tablet Take 1 to 2 T po within onset of headache. Repeat in 2 hours if necessary. Do not exceed more than 200 mg/day.  10 tablet  1  . albuterol (PROVENTIL HFA;VENTOLIN HFA) 108 (90 BASE) MCG/ACT inhaler Use 2 puffs twice daily for 4-6 days, then PRN q6h cough.  1 Inhaler  0    Review of Systems  Constitutional: Negative for fever and chills.  HENT: Negative for congestion, ear pain and sore throat.   Eyes: Negative for blurred vision, pain and redness.  Respiratory: Negative for wheezing.   Cardiovascular: Negative for chest pain, palpitations and leg swelling.  Gastrointestinal: Positive for nausea, vomiting and abdominal pain. Negative for bowel incontinence.  Genitourinary: Positive for frequency and hematuria. Negative for bladder incontinence, dysuria and urgency.  Musculoskeletal: Positive for back pain.  Skin: Negative for rash.  Neurological: Positive for dizziness.  Psychiatric/Behavioral: Negative for depression. The patient is not nervous/anxious.    Physical Exam   There were no vitals taken for this visit.  Physical Exam  Constitutional: She is oriented  to person, place, and time. She appears well-developed and well-nourished. No distress.  HENT:  Head: Normocephalic and atraumatic.  Eyes: EOM are normal.  Neck: Neck supple.  Cardiovascular: Normal rate and regular rhythm.   Respiratory: Effort normal and breath sounds normal.  GI: Soft. Bowel sounds are normal. There is no tenderness.  Right flank pain   Genitourinary: Vagina normal.  Musculoskeletal:       Lumbar back: She exhibits tenderness.       Back:  Patient has pain when she goes from bending forward to standing in the left lower back and with leaning to the side.   Neurological: She is alert and oriented to person, place, and time.    Skin: Skin is warm and dry.  Psychiatric: She has a normal mood and affect. Her behavior is normal. Judgment and thought content normal.  pt states pain is better when she is sitting  MAU Course  Procedures Results for orders placed during the hospital encounter of 03/06/14 (from the past 24 hour(s))  URINALYSIS, ROUTINE W REFLEX MICROSCOPIC     Status: Abnormal   Collection Time    03/06/14  5:53 PM      Result Value Ref Range   Color, Urine AMBER (*) YELLOW   APPearance HAZY (*) CLEAR   Specific Gravity, Urine 1.020  1.005 - 1.030   pH 6.0  5.0 - 8.0   Glucose, UA NEGATIVE  NEGATIVE mg/dL   Hgb urine dipstick LARGE (*) NEGATIVE   Bilirubin Urine NEGATIVE  NEGATIVE   Ketones, ur 15 (*) NEGATIVE mg/dL   Protein, ur NEGATIVE  NEGATIVE mg/dL   Urobilinogen, UA 1.0  0.0 - 1.0 mg/dL   Nitrite NEGATIVE  NEGATIVE   Leukocytes, UA NEGATIVE  NEGATIVE  URINE MICROSCOPIC-ADD ON     Status: None   Collection Time    03/06/14  5:53 PM      Result Value Ref Range   Squamous Epithelial / LPF RARE  RARE   WBC, UA 0-2  <3 WBC/hpf   RBC / HPF TOO NUMEROUS TO COUNT  <3 RBC/hpf   Bacteria, UA RARE  RARE  CBC WITH DIFFERENTIAL     Status: Abnormal   Collection Time    03/06/14  6:05 PM      Result Value Ref Range   WBC 5.1  4.0 - 10.5 K/uL   RBC 3.94  3.87 - 5.11 MIL/uL   Hemoglobin 12.4  12.0 - 15.0 g/dL   HCT 41.3 (*) 24.4 - 01.0 %   MCV 90.4  78.0 - 100.0 fL   MCH 31.5  26.0 - 34.0 pg   MCHC 34.8  30.0 - 36.0 g/dL   RDW 27.2  53.6 - 64.4 %   Platelets 184  150 - 400 K/uL   Neutrophils Relative % 77  43 - 77 %   Neutro Abs 3.9  1.7 - 7.7 K/uL   Lymphocytes Relative 14  12 - 46 %   Lymphs Abs 0.7  0.7 - 4.0 K/uL   Monocytes Relative 8  3 - 12 %   Monocytes Absolute 0.4  0.1 - 1.0 K/uL   Eosinophils Relative 1  0 - 5 %   Eosinophils Absolute 0.0  0.0 - 0.7 K/uL   Basophils Relative 0  0 - 1 %   Basophils Absolute 0.0  0.0 - 0.1 K/uL  BASIC METABOLIC PANEL     Status: Abnormal    Collection Time    03/06/14  6:05 PM      Result Value Ref Range   Sodium 136 (*) 137 - 147 mEq/L   Potassium 3.3 (*) 3.7 - 5.3 mEq/L   Chloride 101  96 - 112 mEq/L   CO2 23  19 - 32 mEq/L   Glucose, Bld 86  70 - 99 mg/dL   BUN 7  6 - 23 mg/dL   Creatinine, Ser 5.02  0.50 - 1.10 mg/dL   Calcium 8.6  8.4 - 77.4 mg/dL   GFR calc non Af Amer >90  >90 mL/min   GFR calc Af Amer >90  >90 mL/min   Anion gap 12  5 - 15    MDM Discussed with Dr. Ellyn Hack and will give patient Flexeril 10 mg PO and observe.  Pt did not feel better after her flexeril but states pain is dull pain; pt was concerned b/c she just doesn't feel well Discussed with Dr. Ellyn Hack- pt can go home, but to report increase in pain, fever, Nausea/vomiting Keep OB appointment; Tylenol, heat, comfort measures Urine culture pending Assessment and Plan  Care turned over to Pamelia Hoit, NP and she will re evaluate the patient and discuss plan of care.  Back pain - ?muscular skeletal Tylenol, heat, comfort measures; information on back pain in pregnancy and back exercises given Keep OB appointment Report increase in pain, any fever, nausea/vomiting Urine culture pending Pamelia Hoit, WHNP-BC  NEESE,HOPE 03/06/2014, 6:01 PM

## 2014-03-06 NOTE — Progress Notes (Signed)
Pre visit review using our clinic review tool, if applicable. No additional management support is needed unless otherwise documented below in the visit note. 

## 2014-03-06 NOTE — MAU Note (Signed)
C/o UTI symptoms and  "generally feeling bad" for past 2 days;

## 2014-03-06 NOTE — Progress Notes (Signed)
Subjective:     Alyssa Le is a 28 y.o. female presents w/c/o not feeling well since yesterday:fatigue, nausea, back pain started yesterday. Vomited times 2 today, kept lunch down today. She denies fever, abd pain, dysuria, frequency. She is [redacted] weeks pregnant. She has a history of pyelonephritis.  The following portions of the patient's history were reviewed and updated as appropriate: allergies, current medications, past medical history, past social history, past surgical history and problem list.  Review of Systems Pertinent items are noted in HPI.    Objective:    BP 93/60  Pulse 101  Temp(Src) 98.1 F (36.7 C) (Oral)  Ht 5\' 6"  (1.676 m)  Wt 129 lb (58.514 kg)  BMI 20.83 kg/m2  SpO2 98% BP 93/60  Pulse 101  Temp(Src) 98.1 F (36.7 C) (Oral)  Ht 5\' 6"  (1.676 m)  Wt 129 lb (58.514 kg)  BMI 20.83 kg/m2  SpO2 98% Took oral temp twice-no fever. General appearance: alert, cooperative, appears stated age, mild distress and looks tired, happy about pregnancy Head: Normocephalic, without obvious abnormality, atraumatic Eyes: negative findings: lids and lashes normal and conjunctivae and sclerae normal Ears: normal TM's and external ear canals both ears Throat: lips, mucosa, and tongue normal; teeth and gums normal Back: bilat CVA tenderness Lungs: clear to auscultation bilaterally Heart: slightly tachycardic, reg rhythm, no murmur  Abdomen: no HSM, tender RUQ & LUQ & epigastric. Did not assess uterus.  Neck: no cervical or Lynn LAD  Assessment:  1. CVA tenderness - Urine culture-pending - POCT urinalysis dipstick-large blood, trace protein  2. Acute pyelonephritis Spoke w/Dr who advised to send pt to Maternity Admissions at [facility] hospital.  3. Pregnancy 18 weeks, per pt  Pt to go home, pack bag, go to [facility] understands instructions, is tearful. Her husband will drive her.

## 2014-03-07 LAB — CULTURE, OB URINE
Colony Count: NO GROWTH
Culture: NO GROWTH

## 2014-03-07 LAB — URINE CULTURE
Colony Count: NO GROWTH
Organism ID, Bacteria: NO GROWTH

## 2014-03-11 ENCOUNTER — Telehealth: Payer: Self-pay | Admitting: Nurse Practitioner

## 2014-03-11 NOTE — Telephone Encounter (Signed)
Patient notified of results. Patient stated that she will be seeing her OB tomorrow.

## 2014-03-11 NOTE — Telephone Encounter (Signed)
No pyelo.

## 2014-04-07 ENCOUNTER — Encounter (HOSPITAL_COMMUNITY): Payer: Self-pay | Admitting: *Deleted

## 2014-06-06 NOTE — L&D Delivery Note (Signed)
Delivery Note Pt progressed to complete dilation and pushed about 30 minutes. At 10:48 AM a healthy female was delivered via Vaginal, Spontaneous Delivery (Presentation: ; Occiput Anterior).  APGAR: 9, 9; weight pending  .   Placenta status: Intact, Spontaneous.  Cord: 3 vessels with the following complications: None. Mild/moderate uterine atony noted and responded to pitocin, bimanual massage,  and methergine x 1.  Anesthesia: Epidural  Episiotomy: None Lacerations: None Suture Repair: na Est. Blood Loss (mL):  400cc  Mom to postpartum.  Baby to Couplet care / Skin to Skin.  Oliver Pila 08/03/2014, 11:11 AM

## 2014-07-10 LAB — OB RESULTS CONSOLE GBS: STREP GROUP B AG: NEGATIVE

## 2014-08-02 ENCOUNTER — Inpatient Hospital Stay (HOSPITAL_COMMUNITY)
Admission: AD | Admit: 2014-08-02 | Discharge: 2014-08-04 | DRG: 774 | Disposition: A | Discharge status: Home / Self Care | Payer: BLUE CROSS/BLUE SHIELD | Source: Ambulatory Visit | Attending: Obstetrics and Gynecology | Admitting: Obstetrics and Gynecology

## 2014-08-02 ENCOUNTER — Encounter (HOSPITAL_COMMUNITY): Payer: Self-pay | Admitting: *Deleted

## 2014-08-02 DIAGNOSIS — Z3A39 39 weeks gestation of pregnancy: Secondary | ICD-10-CM | POA: Diagnosis present

## 2014-08-02 DIAGNOSIS — O4292 Full-term premature rupture of membranes, unspecified as to length of time between rupture and onset of labor: Principal | ICD-10-CM | POA: Diagnosis present

## 2014-08-02 DIAGNOSIS — R21 Rash and other nonspecific skin eruption: Secondary | ICD-10-CM | POA: Diagnosis present

## 2014-08-02 LAB — POCT FERN TEST: POCT Fern Test: POSITIVE

## 2014-08-02 NOTE — MAU Note (Addendum)
Was sleeping and awoke and had gush of clear fld with some pink tinge at times about 2145. Has continued to leak fld. Was 2-3cm on Weds.

## 2014-08-02 NOTE — Progress Notes (Signed)
Dr Senaida Ores notified of pt's admission and status. Aware of srom at 2145, cl fld. SVE, ctx pattern, variables but fhr reactive. Admit orders received. BS to call Dr Senaida Ores if pt does not want epidural once pitocin started.

## 2014-08-03 ENCOUNTER — Inpatient Hospital Stay (HOSPITAL_COMMUNITY): Payer: BLUE CROSS/BLUE SHIELD | Admitting: Anesthesiology

## 2014-08-03 ENCOUNTER — Encounter (HOSPITAL_COMMUNITY): Payer: Self-pay | Admitting: *Deleted

## 2014-08-03 DIAGNOSIS — Z3A39 39 weeks gestation of pregnancy: Secondary | ICD-10-CM | POA: Diagnosis present

## 2014-08-03 DIAGNOSIS — R21 Rash and other nonspecific skin eruption: Secondary | ICD-10-CM | POA: Diagnosis present

## 2014-08-03 DIAGNOSIS — O4292 Full-term premature rupture of membranes, unspecified as to length of time between rupture and onset of labor: Secondary | ICD-10-CM | POA: Diagnosis present

## 2014-08-03 LAB — TYPE AND SCREEN
ABO/RH(D): O POS
Antibody Screen: NEGATIVE

## 2014-08-03 LAB — CBC
HCT: 33.8 % — ABNORMAL LOW (ref 36.0–46.0)
Hemoglobin: 11.2 g/dL — ABNORMAL LOW (ref 12.0–15.0)
MCH: 30.4 pg (ref 26.0–34.0)
MCHC: 33.1 g/dL (ref 30.0–36.0)
MCV: 91.6 fL (ref 78.0–100.0)
PLATELETS: 201 10*3/uL (ref 150–400)
RBC: 3.69 MIL/uL — AB (ref 3.87–5.11)
RDW: 13.3 % (ref 11.5–15.5)
WBC: 6.6 10*3/uL (ref 4.0–10.5)

## 2014-08-03 MED ORDER — EPHEDRINE 5 MG/ML INJ
10.0000 mg | INTRAVENOUS | Status: DC | PRN
  Filled 2014-08-03: qty 2

## 2014-08-03 MED ORDER — LACTATED RINGERS IV SOLN
INTRAVENOUS | Status: DC
  Administered 2014-08-03 (×2): via INTRAVENOUS

## 2014-08-03 MED ORDER — FENTANYL 2.5 MCG/ML BUPIVACAINE 1/10 % EPIDURAL INFUSION (WH - ANES)
14.0000 mL/h | INTRAMUSCULAR | Status: DC | PRN
  Administered 2014-08-03: 14 mL/h via EPIDURAL
  Filled 2014-08-03: qty 125

## 2014-08-03 MED ORDER — PHENYLEPHRINE 40 MCG/ML (10ML) SYRINGE FOR IV PUSH (FOR BLOOD PRESSURE SUPPORT)
80.0000 ug | PREFILLED_SYRINGE | INTRAVENOUS | Status: DC | PRN
  Filled 2014-08-03: qty 2

## 2014-08-03 MED ORDER — OXYCODONE-ACETAMINOPHEN 5-325 MG PO TABS: 1.0000 | ORAL_TABLET | ORAL | Status: DC | PRN

## 2014-08-03 MED ORDER — OXYTOCIN 40 UNITS IN LACTATED RINGERS INFUSION - SIMPLE MED
62.5000 mL/h | INTRAVENOUS | Status: DC
Start: 2014-08-03 — End: 2014-08-03

## 2014-08-03 MED ORDER — ONDANSETRON HCL 4 MG PO TABS: 4.0000 mg | ORAL_TABLET | ORAL | Status: DC | PRN

## 2014-08-03 MED ORDER — OXYCODONE-ACETAMINOPHEN 5-325 MG PO TABS: 2.0000 | ORAL_TABLET | ORAL | Status: DC | PRN

## 2014-08-03 MED ORDER — ACETAMINOPHEN 325 MG PO TABS: 650.0000 mg | ORAL_TABLET | ORAL | Status: DC | PRN

## 2014-08-03 MED ORDER — TETANUS-DIPHTH-ACELL PERTUSSIS 5-2.5-18.5 LF-MCG/0.5 IM SUSP: 0.5000 mL | Freq: Once | INTRAMUSCULAR | Status: DC

## 2014-08-03 MED ORDER — ZOLPIDEM TARTRATE 5 MG PO TABS: 5.0000 mg | ORAL_TABLET | Freq: Every evening | ORAL | Status: DC | PRN

## 2014-08-03 MED ORDER — SIMETHICONE 80 MG PO CHEW: 80.0000 mg | CHEWABLE_TABLET | ORAL | Status: DC | PRN

## 2014-08-03 MED ORDER — ONDANSETRON HCL 4 MG/2ML IJ SOLN: 4.0000 mg | INTRAMUSCULAR | Status: DC | PRN

## 2014-08-03 MED ORDER — METHYLERGONOVINE MALEATE 0.2 MG/ML IJ SOLN
INTRAMUSCULAR | Status: AC
  Administered 2014-08-03: 0.2 mg
  Filled 2014-08-03: qty 1

## 2014-08-03 MED ORDER — DIPHENHYDRAMINE HCL 50 MG/ML IJ SOLN: 12.5000 mg | INTRAMUSCULAR | Status: DC | PRN

## 2014-08-03 MED ORDER — LACTATED RINGERS IV SOLN: 500.0000 mL | Freq: Once | INTRAVENOUS | Status: DC

## 2014-08-03 MED ORDER — TERBUTALINE SULFATE 1 MG/ML IJ SOLN
0.2500 mg | Freq: Once | INTRAMUSCULAR | Status: DC | PRN
  Filled 2014-08-03: qty 1

## 2014-08-03 MED ORDER — LIDOCAINE HCL (PF) 1 % IJ SOLN
30.0000 mL | INTRAMUSCULAR | Status: DC | PRN
Start: 2014-08-03 — End: 2014-08-03
  Filled 2014-08-03: qty 30

## 2014-08-03 MED ORDER — PHENYLEPHRINE 40 MCG/ML (10ML) SYRINGE FOR IV PUSH (FOR BLOOD PRESSURE SUPPORT)
80.0000 ug | PREFILLED_SYRINGE | INTRAVENOUS | Status: DC | PRN
  Filled 2014-08-03: qty 20
  Filled 2014-08-03: qty 2

## 2014-08-03 MED ORDER — IBUPROFEN 600 MG PO TABS
600.0000 mg | ORAL_TABLET | Freq: Four times a day (QID) | ORAL | Status: DC
  Administered 2014-08-03 – 2014-08-04 (×3): 600 mg via ORAL
  Filled 2014-08-03 (×3): qty 1

## 2014-08-03 MED ORDER — SENNOSIDES-DOCUSATE SODIUM 8.6-50 MG PO TABS
2.0000 | ORAL_TABLET | ORAL | Status: DC
  Administered 2014-08-03: 2 via ORAL
  Filled 2014-08-03: qty 2

## 2014-08-03 MED ORDER — PRENATAL MULTIVITAMIN CH
1.0000 | ORAL_TABLET | Freq: Every day | ORAL | Status: DC
  Administered 2014-08-04: 1 via ORAL
  Filled 2014-08-03: qty 1

## 2014-08-03 MED ORDER — BENZOCAINE-MENTHOL 20-0.5 % EX AERO
1.0000 "application " | INHALATION_SPRAY | CUTANEOUS | Status: DC | PRN
  Administered 2014-08-04: 1 via TOPICAL
  Filled 2014-08-03: qty 56

## 2014-08-03 MED ORDER — OXYTOCIN BOLUS FROM INFUSION: 500.0000 mL | INTRAVENOUS | Status: DC

## 2014-08-03 MED ORDER — BUTORPHANOL TARTRATE 1 MG/ML IJ SOLN: 1.0000 mg | INTRAMUSCULAR | Status: DC | PRN

## 2014-08-03 MED ORDER — FLEET ENEMA 7-19 GM/118ML RE ENEM: 1.0000 | ENEMA | RECTAL | Status: DC | PRN

## 2014-08-03 MED ORDER — OXYTOCIN 40 UNITS IN LACTATED RINGERS INFUSION - SIMPLE MED
1.0000 m[IU]/min | INTRAVENOUS | Status: DC
  Administered 2014-08-03: 2 m[IU]/min via INTRAVENOUS
  Filled 2014-08-03: qty 1000

## 2014-08-03 MED ORDER — DIBUCAINE 1 % RE OINT: 1.0000 "application " | TOPICAL_OINTMENT | RECTAL | Status: DC | PRN

## 2014-08-03 MED ORDER — ONDANSETRON HCL 4 MG/2ML IJ SOLN: 4.0000 mg | Freq: Four times a day (QID) | INTRAMUSCULAR | Status: DC | PRN

## 2014-08-03 MED ORDER — ALBUTEROL SULFATE (2.5 MG/3ML) 0.083% IN NEBU: 3.0000 mL | INHALATION_SOLUTION | RESPIRATORY_TRACT | Status: DC | PRN

## 2014-08-03 MED ORDER — LACTATED RINGERS IV SOLN
500.0000 mL | INTRAVENOUS | Status: DC | PRN
  Administered 2014-08-03: 500 mL via INTRAVENOUS

## 2014-08-03 MED ORDER — OXYCODONE-ACETAMINOPHEN 5-325 MG PO TABS
1.0000 | ORAL_TABLET | ORAL | Status: DC | PRN
Start: 2014-08-03 — End: 2014-08-03

## 2014-08-03 MED ORDER — WITCH HAZEL-GLYCERIN EX PADS: 1.0000 "application " | MEDICATED_PAD | CUTANEOUS | Status: DC | PRN

## 2014-08-03 MED ORDER — LIDOCAINE HCL (PF) 1 % IJ SOLN
INTRAMUSCULAR | Status: DC | PRN
  Administered 2014-08-03 (×2): 5 mL

## 2014-08-03 MED ORDER — DIPHENHYDRAMINE HCL 25 MG PO CAPS: 25.0000 mg | ORAL_CAPSULE | Freq: Four times a day (QID) | ORAL | Status: DC | PRN

## 2014-08-03 MED ORDER — LANOLIN HYDROUS EX OINT: TOPICAL_OINTMENT | CUTANEOUS | Status: DC | PRN

## 2014-08-03 MED ORDER — CITRIC ACID-SODIUM CITRATE 334-500 MG/5ML PO SOLN: 30.0000 mL | ORAL | Status: DC | PRN

## 2014-08-03 MED ORDER — MISOPROSTOL 200 MCG PO TABS
600.0000 ug | ORAL_TABLET | Freq: Once | ORAL | Status: AC
  Administered 2014-08-03: 200 ug via RECTAL

## 2014-08-03 MED ORDER — MISOPROSTOL 200 MCG PO TABS
ORAL_TABLET | ORAL | Status: AC
  Filled 2014-08-03: qty 3

## 2014-08-03 NOTE — Progress Notes (Addendum)
Patient ID: Alyssa Le, female   DOB: [birth year], 29 y.o.   MRN: [id number] Micah Flesher back in room to check on patient's bleeding and noted it to be a bit more moderate than expected.   Large clot evacuated from LUS and still some trickle noted.   Cytotec placed rectally Pt reexamined after 10 minutes and bleeding improved to expected.   I estimate the bleeding to have been about another 250cc, so total EBL for delivery about 650cc. Starting HGB 11.2  Will follow closely.

## 2014-08-03 NOTE — Anesthesia Preprocedure Evaluation (Signed)

## 2014-08-03 NOTE — MAU Note (Signed)
Report called to [employer] in [place]. Ok for pt to come to [facility]

## 2014-08-03 NOTE — H&P (Signed)
Alyssa Le is a 29 y.o. female G3P2002 at 39+ weeks (EDD 08/08/14 by LMP c/w 6 week [place]) presenting for [facility] tonight around 10 pm.  She had + ferning on presentation but ctx were irregular.  Prenatal care complicated by a low lying placenta which resolved on f/u scan.  Maternal Medical History:  Reason for admission: Rupture of membranes.   Contractions: Onset was 3-5 hours ago.   Frequency: irregular.   Perceived severity is mild.    Fetal activity: Perceived fetal activity is normal.    Prenatal Complications - Diabetes: none.    OB History    Gravida Para Term Preterm AB TAB SAB Ectopic Multiple Living   3 2 2       2     2010 NSVD 7#4oz 2012 NSVD 6#10oz  Past Medical History  Diagnosis Date  . Ovarian cyst, right     surgically removed. 29 y.o.  . Allergy     multiple food & environmental allergies (tree pollens & fruits)  . Arthritis     exercise & allergy induced  . Headache   . Asthma     inhaler last used 2015   Past Surgical History  Procedure Laterality Date  . Appendectomy    . 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 She was adopted. Social History:  reports that she has never smoked. She does not have any smokeless tobacco history on file. She reports that she does not drink alcohol or use illicit drugs.   Prenatal Transfer Tool  Maternal Diabetes: No Genetic Screening: Declined Maternal Ultrasounds/Referrals: Normal Fetal Ultrasounds or other Referrals:  None Maternal Substance Abuse:  No Significant Maternal Medications:   None Significant Maternal Lab Results:  None Other Comments:  None  ROS  Dilation: 2.5 Effacement (%): 50 Station: -2 Exam by:: Honeycutt, RN Blood pressure 104/68, pulse 87, temperature 98.3 F (36.8 C), temperature source Oral, resp. rate 18, height 5\' 6"  (1.676 m), weight 72.666 kg (160 lb 3.2 oz). Maternal Exam:  Uterine Assessment: Contraction strength is mild.  Contraction frequency is irregular.   Abdomen: Patient reports no abdominal tenderness. Fetal presentation: vertex  Introitus: Normal vulva. Normal vagina.  Ferning test: positive.  Amniotic fluid character: clear.  Pelvis: adequate for delivery.      Physical Exam  Constitutional: She is oriented to person, place, and time. She appears well-developed and well-nourished.  Cardiovascular: Normal rate and regular rhythm.   Respiratory: Effort normal and breath sounds normal.  GI: Soft.  Genitourinary: Vagina normal and uterus normal.  Neurological: She is alert and oriented to person, place, and time.  Psychiatric: She has a normal mood and affect.    Prenatal labs: ABO, Rh: --/--/O POS (02/28 0015) Antibody: NEG (02/28 0015) Rubella: Immune (08/10 0000) RPR: Nonreactive (08/10 0000)  HBsAg: Negative (08/10 0000)  HIV: Non-reactive (08/10 0000)  GBS: Negative (02/04 0000)  One hour GTT 53 Hgb AA  Assessment/Plan: Pt admitted with PROM for labor augmentation with pitocin.  Not sure if will get epidural but would like available if decides to later.  01-08-1986 08/03/2014, 2:02 AM

## 2014-08-03 NOTE — Progress Notes (Signed)
Patient ID: Alyssa Le, female   DOB: [birth year], 29 y.o.   MRN: [id number] Pt states contractions are getting more painful but still does not appear active afeb vss FHR Category 1 Pitocin at 12 mu Cervix 70/4-5/-1  D/w pt has just had a prolonged latent phase Will increase pitocin, follow progress Epidural prn

## 2014-08-03 NOTE — Lactation Note (Signed)
This note was copied from the chart of Alyssa Jin Shockley. Lactation Consultation Note  Patient Name: Alyssa Le [id number] Date: 08/03/2014 Reason for consult: Initial assessment Baby 10 hours of life. Mom nursed her two older children. Mom nursing baby in cradle position when [facility] entered room. Mom states that she is having some nipple soreness. Demonstrated to mom how to use cross-cradle position to achieve a deeper latch. Mom reports increased comfort. Mom given [facility] brochure, aware of OP/BFSG, community resources, and [facility] phone line assistance after D/C.   Maternal Data Has patient been taught Hand Expression?: Yes (Per mom.) Does the patient have breastfeeding experience prior to this delivery?: Yes  Feeding Feeding Type: Breast Fed Length of feed: 45 min  LATCH Score/Interventions Latch: Grasps breast easily, tongue down, lips flanged, rhythmical sucking. Intervention(s): Adjust position;Assist with latch  Audible Swallowing: A few with stimulation  Type of Nipple: Everted at rest and after stimulation  Comfort (Breast/Nipple): Soft / non-tender     Hold (Positioning): Assistance needed to correctly position infant at breast and maintain latch.  LATCH Score: 8  Lactation Tools Discussed/Used     Consult Status Consult Status: Follow-up Date: 08/04/14 Follow-up type: In-patient    Geralynn Ochs 08/03/2014, 9:47 PM

## 2014-08-03 NOTE — Discharge Summary (Signed)
Obstetric Discharge Summary Reason for Admission: rupture of membranes Prenatal Procedures: none Intrapartum Procedures: spontaneous vaginal delivery Postpartum Procedures: none Complications-Operative and Postpartum: mild postpartum  hemorrhage   HEMOGLOBIN  Date Value Ref Range Status  08/04/2014 10.6* 12.0 - 15.0 g/dL Final  62/86/3817 71.1 12.2 - 16.2 g/dL Final   HCT  Date Value Ref Range Status  08/04/2014 32.0* 36.0 - 46.0 % Final   HCT, POC  Date Value Ref Range Status  09/12/2011 393.3* 37.7 - 47.9 % Final    Physical Exam:  General: alert and cooperative Lochia: appropriate Uterine Fundus: firm   Discharge Diagnoses: Term Pregnancy-delivered  Discharge Information: Date: 08/04/2014 Activity: pelvic rest Diet: routine Medications: Ibuprofen Condition: improved Instructions: refer to practice specific booklet Discharge to: home Follow-up Information    Follow up with Oliver Pila, MD In 6 weeks.   Specialty:  Obstetrics and Gynecology   Why:  postpartum visit   Contact information:   [address] [place] [address] [place] [address] [phone]       Newborn Data: Live born female  Birth Weight: 8 lb 6 oz (3800 g) APGAR: 9, 9  Home with mother.  Oliver Pila 08/04/2014, 11:25 AM

## 2014-08-03 NOTE — Anesthesia Postprocedure Evaluation (Signed)
  Anesthesia Post-op Note  Patient: Alyssa Le  Procedure(s) Performed: * No procedures listed *  Patient Location: Mother/Baby  Anesthesia Type:Epidural  Level of Consciousness: awake  Airway and Oxygen Therapy: Patient Spontanous Breathing  Post-op Pain: mild  Post-op Assessment: Patient's Cardiovascular Status Stable and Respiratory Function Stable  Post-op Vital Signs: stable  Last Vitals:  Filed Vitals:   08/03/14 1345  BP: 120/58  Pulse: 95  Temp: 36.9 C  Resp: 18    Complications: No apparent anesthesia complications

## 2014-08-03 NOTE — Anesthesia Procedure Notes (Signed)
Epidural Patient location during procedure: OB Start time: 08/03/2014 9:17 AM  Staffing Anesthesiologist: Brayton Caves Performed by: anesthesiologist   Preanesthetic Checklist Completed: patient identified, site marked, surgical consent, pre-op evaluation, timeout performed, IV checked, risks and benefits discussed and monitors and equipment checked  Epidural Patient position: sitting Prep: site prepped and draped and DuraPrep Patient monitoring: continuous pulse ox and blood pressure Approach: midline Location: L3-L4 Injection technique: LOR air  Needle:  Needle type: Tuohy  Needle gauge: 17 G Needle length: 9 cm and 9 Needle insertion depth: 4 cm Catheter type: closed end flexible Catheter size: 19 Gauge Catheter at skin depth: 10 cm Test dose: negative  Assessment Events: blood not aspirated, injection not painful, no injection resistance, negative IV test and no paresthesia  Additional Notes Patient identified.  Risk benefits discussed including failed block, incomplete pain control, headache, nerve damage, paralysis, blood pressure changes, nausea, vomiting, reactions to medication both toxic or allergic, and postpartum back pain.  Patient expressed understanding and wished to proceed.  All questions were answered.  Sterile technique used throughout procedure and epidural site dressed with sterile barrier dressing. No paresthesia or other complications noted.The patient did not experience any signs of intravascular injection such as tinnitus or metallic taste in mouth nor signs of intrathecal spread such as rapid motor block. Please see nursing notes for vital signs.

## 2014-08-04 LAB — CBC
HCT: 32 % — ABNORMAL LOW (ref 36.0–46.0)
Hemoglobin: 10.6 g/dL — ABNORMAL LOW (ref 12.0–15.0)
MCH: 30.2 pg (ref 26.0–34.0)
MCHC: 33.1 g/dL (ref 30.0–36.0)
MCV: 91.2 fL (ref 78.0–100.0)
Platelets: 178 10*3/uL (ref 150–400)
RBC: 3.51 MIL/uL — AB (ref 3.87–5.11)
RDW: 13.4 % (ref 11.5–15.5)
WBC: 11.5 10*3/uL — ABNORMAL HIGH (ref 4.0–10.5)

## 2014-08-04 LAB — RPR: RPR Ser Ql: NONREACTIVE

## 2014-08-04 MED ORDER — IBUPROFEN 600 MG PO TABS: 600.0000 mg | ORAL_TABLET | Freq: Four times a day (QID) | ORAL | Status: DC

## 2014-08-04 MED ORDER — HYDROCORTISONE 1 % EX CREA: TOPICAL_CREAM | Freq: Two times a day (BID) | CUTANEOUS | Status: DC

## 2014-08-04 MED ORDER — HYDROCORTISONE 1 % EX CREA
TOPICAL_CREAM | Freq: Two times a day (BID) | CUTANEOUS | Status: DC
  Administered 2014-08-04: 13:00:00 via TOPICAL
  Filled 2014-08-04: qty 28

## 2014-08-04 MED ORDER — TRIAMCINOLONE ACETONIDE 0.1 % EX CREA
TOPICAL_CREAM | Freq: Three times a day (TID) | CUTANEOUS | Status: DC
  Administered 2014-08-04: 13:00:00 via TOPICAL
  Filled 2014-08-04: qty 15

## 2014-08-04 NOTE — Lactation Note (Signed)
This note was copied from the chart of Alyssa Le. Lactation Consultation Note: follow up visit with this experienced BF mom. She reports that baby just finished feeding and is latching well. Nipples tender but intact. Comfort gels given with instructions for use. No questions at present. To call prn  Patient Name: Alyssa Le [id number] Date: 08/04/2014 Reason for consult: Follow-up assessment   Maternal Data Formula Feeding for Exclusion: No Does the patient have breastfeeding experience prior to this delivery?: Yes  Feeding   LATCH Score/Interventions                      Lactation Tools Discussed/Used     Consult Status Consult Status: PRN    Pamelia Hoit 08/04/2014, 12:00 PM

## 2014-08-04 NOTE — Progress Notes (Signed)
Post Partum Day 1 Subjective: tolerating PO  Has a rash from the monitors on her stomach, circular and red raised area.  Requests early d/c  Objective: Blood pressure 99/65, pulse 90, temperature 98 F (36.7 C), temperature source Oral, resp. rate 18, height 5\' 6"  (1.676 m), weight 72.666 kg (160 lb 3.2 oz), SpO2 100 %, unknown if currently breastfeeding.  Physical Exam:  General: alert and cooperative Lochia: appropriate Uterine Fundus: firm   Recent Labs  08/03/14 0015 08/04/14 0542  HGB 11.2* 10.6*  HCT 33.8* 32.0*    Assessment/Plan: Discharge home  Hydrocortisone cream for rash, pt will call if needs something else--states had to have prednisone last 2 deliveries   LOS: 1 day   Suhani Stillion W 08/04/2014, 11:23 AM

## 2014-08-19 ENCOUNTER — Ambulatory Visit (INDEPENDENT_AMBULATORY_CARE_PROVIDER_SITE_OTHER): Payer: BLUE CROSS/BLUE SHIELD | Admitting: Nurse Practitioner

## 2014-08-19 ENCOUNTER — Encounter: Payer: Self-pay | Admitting: Nurse Practitioner

## 2014-08-19 VITALS — BP 101/66 | HR 94 | Temp 99.3°F | Ht 66.0 in | Wt 141.0 lb

## 2014-08-19 DIAGNOSIS — N61 Inflammatory disorders of breast: Secondary | ICD-10-CM

## 2014-08-19 DIAGNOSIS — Z87440 Personal history of urinary (tract) infections: Secondary | ICD-10-CM

## 2014-08-19 DIAGNOSIS — R1013 Epigastric pain: Secondary | ICD-10-CM

## 2014-08-19 LAB — POCT URINALYSIS DIPSTICK
Glucose, UA: NEGATIVE
Ketones, UA: NEGATIVE
NITRITE UA: NEGATIVE
PH UA: 6.5
Protein, UA: 30
SPEC GRAV UA: 1.02
Urobilinogen, UA: 1

## 2014-08-19 MED ORDER — SUCRALFATE 1 G PO TABS: 1.0000 g | ORAL_TABLET | Freq: Four times a day (QID) | ORAL | Status: DC

## 2014-08-19 NOTE — Progress Notes (Signed)
Subjective:     Alyssa Le is a 29 y.o. female presents with epigastric pain, fever, L breast pain. She is 2 weeks post-partum & is breast-feeding. epigastric pain: started 1 week ago. Severe enough to Keep awake. Eating certain foods makes pain worse. Not Associated w/nausea, constipation, diarrhea. Pain is Better in last few days since stopped taking ibuprophen for post-partum pain. Also stopped drinking coffee & is eating bland diet. She has been drinking cranberry juice due to recent UTI. Finished 10 days bactrim 1 week ago for UTI. Denies pelvic pain & UTI symptoms.  Fever: started yesterday 102.7 last night. Associated w/L breast pain & dull HA yesterday. Wearing bra w/underwire. Baby is nursing from both breasts. Taking tylenol for fever. Pt gives hx of frequent fevers X 12 weeks after last child. OB ofc told her to call PCP ofc for fever.  The following portions of the patient's history were reviewed and updated as appropriate: allergies, current medications, past medical history, past social history, past surgical history and problem list.  Review of Systems Constitutional: negative for night sweats Ears, nose, mouth, throat, and face: negative for earaches, nasal congestion and sore throat Respiratory: negative for cough, pleurisy/chest pain and wheezing Cardiovascular: negative for chest pain, irregular heart beat and lower extremity edema Gastrointestinal: positive for abdominal pain, negative for constipation, diarrhea and nausea Genitourinary:negative for dysuria, frequency and hematuria Musculoskeletal:negative for arthralgias and myalgias Neurological: positive for headaches, negative for dizziness and paresthesia    Objective:    BP 101/66 mmHg  Pulse 94  Temp(Src) 99.3 F (37.4 C) (Oral)  Ht 5\' 6"  (1.676 m)  Wt 141 lb (63.957 kg)  BMI 22.77 kg/m2  SpO2 98% BP 101/66 mmHg  Pulse 94  Temp(Src) 99.3 F (37.4 C) (Oral)  Ht 5\' 6"  (1.676 m)  Wt 141 lb (63.957 kg)   BMI 22.77 kg/m2  SpO2 98% General appearance: alert, cooperative, appears stated age and no distress Head: Normocephalic, without obvious abnormality, atraumatic Eyes: negative findings: lids and lashes normal and conjunctivae and sclerae normal Breasts: No nipple retraction or dimpling, No axillary or supraclavicular adenopathy, positive findings: Tender L breast at 3 & 6 o'clock, mild erythema at 6 o'clock. Baby nursed from L breast while in office-no difficulty, rhythmic swallowing. Skin: see above Neurologic: Grossly normal    Assessment:Plan     1. Recent urinary tract infection - POCT urinalysis dipstick - Urine culture  2. Mastitis, left, acute See pt instructions. Understands to call in 2 days if still fever.  3. Abdominal pain, epigastric Start carafate See diet instructions F/u 4 weeks or sooner if no improvement

## 2014-08-19 NOTE — Patient Instructions (Addendum)
Start carafate. Take as directed. No acidic juices. Only apple & white grape juice.  No acidic foods-tomato.  Do not lay down right after eating-wait an hour.  Eat every 2 hours rather than 3 large meals.   Avoid ibuprophen, motrin, aleve, advil.  Let's re-evaluate in 4 weeks or sooner if pain not improving.  Empty Left breast completely with each feeding. Use hot compress several times daily. Take tylenol if needed for fever.  Call me Friday morning if still running fever.  My office will call with lab results.  Breastfeeding and Mastitis Mastitis is inflammation of the breast tissue. It can occur in women who are breastfeeding. This can make breastfeeding painful. Mastitis will sometimes go away on its own. Your health care provider will help determine if treatment is needed. CAUSES Mastitis is often associated with a blocked milk (lactiferous) duct. This can happen when too much milk builds up in the breast. Causes of excess milk in the breast can include:  Poor latch-on. If your baby is not latched onto the breast properly, she or he may not empty your breast completely while breastfeeding.  Allowing too much time to pass between feedings.  Wearing a bra or other clothing that is too tight. This puts extra pressure on the lactiferous ducts so milk does not flow through them as it should. Mastitis can also be caused by a bacterial infection. Bacteria may enter the breast tissue through cuts or openings in the skin. In women who are breastfeeding, this may occur because of cracked or irritated skin. Cracks in the skin are often caused when your baby does not latch on properly to the breast. SIGNS AND SYMPTOMS  Swelling, redness, tenderness, and pain in an area of the breast.  Swelling of the glands under the arm on the same side.  Fever may or may not accompany mastitis. If an infection is allowed to progress, a collection of pus (abscess) may develop. DIAGNOSIS  Your health  care provider can usually diagnose mastitis based on your symptoms and a physical exam. Tests may be done to help confirm the diagnosis. These may include:  Removal of pus from the breast by applying pressure to the area. This pus can be examined in the lab to determine which bacteria are present. If an abscess has developed, the fluid in the abscess can be removed with a needle. This can also be used to confirm the diagnosis and determine the bacteria present. In most cases, pus will not be present.  Blood tests to determine if your body is fighting a bacterial infection.  Mammogram or ultrasound tests to rule out other problems or diseases. TREATMENT  Mastitis that occurs with breastfeeding will sometimes go away on its own. Your health care provider may choose to wait 24 hours after first seeing you to decide whether a prescription medicine is needed. If your symptoms are worse after 24 hours, your health care provider will likely prescribe an antibiotic medicine to treat the mastitis. He or she will determine which bacteria are most likely causing the infection and will then select an appropriate antibiotic medicine. This is sometimes changed based on the results of tests performed to identify the bacteria, or if there is no response to the antibiotic medicine selected. Antibiotic medicines are usually given by mouth. You may also be given medicine for pain. HOME CARE INSTRUCTIONS  Only take over-the-counter or prescription medicines for pain, fever, or discomfort as directed by your health care provider.  If your  health care provider prescribed an antibiotic medicine, take the medicine as directed. Make sure you finish it even if you start to feel better.  Do not wear a tight or underwire bra. Wear a soft, supportive bra.  Increase your fluid intake, especially if you have a fever.  Continue to empty the breast. Your health care provider can tell you whether this milk is safe for your infant  or needs to be thrown out. You may be told to stop nursing until your health care provider thinks it is safe for your baby. Use a breast pump if you are advised to stop nursing.  Keep your nipples clean and dry.  Empty the first breast completely before going to the other breast. If your baby is not emptying your breasts completely for some reason, use a breast pump to empty your breasts.  If you go back to work, pump your breasts while at work to stay in time with your nursing schedule.  Avoid allowing your breasts to become overly filled with milk (engorged). SEEK MEDICAL CARE IF:  You have pus-like discharge from the breast.  Your symptoms do not improve with the treatment prescribed by your health care provider within 2 days. SEEK IMMEDIATE MEDICAL CARE IF:  Your pain and swelling are getting worse.  You have pain that is not controlled with medicine.  You have a red line extending from the breast toward your armpit.  You have a fever or persistent symptoms for more than 2-3 days.  You have a fever and your symptoms suddenly get worse. MAKE SURE YOU:   Understand these instructions.  Will watch your condition.  Will get help right away if you are not doing well or get worse. Document Released: 09/17/2004 Document Revised: 05/28/2013 Document Reviewed: 12/27/2012 [facility] Patient Information 2015 [place]lyss[place] Le, [name]. This information is not intended to replace advice given to you by your health care provider. Make sure you discuss any questions you have with your health care provider.

## 2014-08-19 NOTE — Progress Notes (Signed)
Pre visit review using our clinic review tool, if applicable. No additional management support is needed unless otherwise documented below in the visit note. 

## 2014-08-20 LAB — URINE CULTURE
Colony Count: NO GROWTH
ORGANISM ID, BACTERIA: NO GROWTH

## 2014-08-21 ENCOUNTER — Telehealth: Payer: Self-pay | Admitting: Nurse Practitioner

## 2014-08-21 NOTE — Telephone Encounter (Signed)
Called and spoke with patient, informed her of her results.. Patient states that she is feeling better;no fever and no breast tenderness.

## 2014-08-21 NOTE — Telephone Encounter (Signed)
pls call pt: Advise no UTI. Ask if feeling better: breast tenderness? Fever?

## 2014-09-16 LAB — HM PAP SMEAR: HM Pap smear: NORMAL

## 2014-09-19 ENCOUNTER — Emergency Department (HOSPITAL_BASED_OUTPATIENT_CLINIC_OR_DEPARTMENT_OTHER): Payer: BLUE CROSS/BLUE SHIELD

## 2014-09-19 ENCOUNTER — Emergency Department (HOSPITAL_BASED_OUTPATIENT_CLINIC_OR_DEPARTMENT_OTHER)
Admission: EM | Admit: 2014-09-19 | Discharge: 2014-09-19 | Disposition: A | Discharge status: Home / Self Care | Payer: BLUE CROSS/BLUE SHIELD | Attending: Emergency Medicine | Admitting: Emergency Medicine

## 2014-09-19 ENCOUNTER — Telehealth: Payer: Self-pay

## 2014-09-19 ENCOUNTER — Ambulatory Visit (INDEPENDENT_AMBULATORY_CARE_PROVIDER_SITE_OTHER): Payer: BLUE CROSS/BLUE SHIELD | Admitting: Nurse Practitioner

## 2014-09-19 ENCOUNTER — Encounter: Payer: Self-pay | Admitting: Nurse Practitioner

## 2014-09-19 ENCOUNTER — Encounter (HOSPITAL_BASED_OUTPATIENT_CLINIC_OR_DEPARTMENT_OTHER): Payer: Self-pay

## 2014-09-19 VITALS — BP 93/63 | HR 87 | Temp 98.7°F | Ht 66.0 in | Wt 143.0 lb

## 2014-09-19 DIAGNOSIS — R1033 Periumbilical pain: Secondary | ICD-10-CM | POA: Diagnosis not present

## 2014-09-19 DIAGNOSIS — K5732 Diverticulitis of large intestine without perforation or abscess without bleeding: Secondary | ICD-10-CM

## 2014-09-19 DIAGNOSIS — Z9049 Acquired absence of other specified parts of digestive tract: Secondary | ICD-10-CM | POA: Diagnosis not present

## 2014-09-19 DIAGNOSIS — Z91040 Latex allergy status: Secondary | ICD-10-CM | POA: Diagnosis not present

## 2014-09-19 DIAGNOSIS — Z7952 Long term (current) use of systemic steroids: Secondary | ICD-10-CM | POA: Insufficient documentation

## 2014-09-19 DIAGNOSIS — R300 Dysuria: Secondary | ICD-10-CM | POA: Diagnosis not present

## 2014-09-19 DIAGNOSIS — J45909 Unspecified asthma, uncomplicated: Secondary | ICD-10-CM | POA: Diagnosis not present

## 2014-09-19 DIAGNOSIS — Z79899 Other long term (current) drug therapy: Secondary | ICD-10-CM | POA: Diagnosis not present

## 2014-09-19 DIAGNOSIS — Z792 Long term (current) use of antibiotics: Secondary | ICD-10-CM | POA: Diagnosis not present

## 2014-09-19 DIAGNOSIS — Z880 Allergy status to penicillin: Secondary | ICD-10-CM | POA: Diagnosis not present

## 2014-09-19 DIAGNOSIS — M1990 Unspecified osteoarthritis, unspecified site: Secondary | ICD-10-CM | POA: Diagnosis not present

## 2014-09-19 DIAGNOSIS — R109 Unspecified abdominal pain: Secondary | ICD-10-CM | POA: Diagnosis present

## 2014-09-19 DIAGNOSIS — Z3202 Encounter for pregnancy test, result negative: Secondary | ICD-10-CM | POA: Insufficient documentation

## 2014-09-19 DIAGNOSIS — Z8742 Personal history of other diseases of the female genital tract: Secondary | ICD-10-CM | POA: Insufficient documentation

## 2014-09-19 LAB — CBC WITH DIFFERENTIAL/PLATELET
BASOS ABS: 0 10*3/uL (ref 0.0–0.1)
BASOS ABS: 0 10*3/uL (ref 0.0–0.1)
BASOS PCT: 0.5 % (ref 0.0–3.0)
BASOS PCT: 0 % (ref 0–1)
Eosinophils Absolute: 0.2 10*3/uL (ref 0.0–0.7)
Eosinophils Absolute: 0.3 10*3/uL (ref 0.0–0.7)
Eosinophils Relative: 2 % (ref 0.0–5.0)
Eosinophils Relative: 3 % (ref 0–5)
HEMATOCRIT: 37.6 % (ref 36.0–46.0)
HEMATOCRIT: 37.7 % (ref 36.0–46.0)
HEMOGLOBIN: 11.9 g/dL — AB (ref 12.0–15.0)
Hemoglobin: 12.2 g/dL (ref 12.0–15.0)
LYMPHS PCT: 16.2 % (ref 12.0–46.0)
Lymphocytes Relative: 16 % (ref 12–46)
Lymphs Abs: 1.5 10*3/uL (ref 0.7–4.0)
Lymphs Abs: 1.6 10*3/uL (ref 0.7–4.0)
MCH: 27.8 pg (ref 26.0–34.0)
MCHC: 32.5 g/dL (ref 30.0–36.0)
MCHC: 31.6 g/dL (ref 30.0–36.0)
MCV: 85.7 fl (ref 78.0–100.0)
MCV: 88.1 fL (ref 78.0–100.0)
MONOS PCT: 9 % (ref 3–12)
Monocytes Absolute: 0.5 10*3/uL (ref 0.1–1.0)
Monocytes Absolute: 0.9 10*3/uL (ref 0.1–1.0)
Monocytes Relative: 5.8 % (ref 3.0–12.0)
NEUTROS ABS: 7.5 10*3/uL (ref 1.7–7.7)
NEUTROS PCT: 75.5 % (ref 43.0–77.0)
Neutro Abs: 6.9 10*3/uL (ref 1.4–7.7)
Neutrophils Relative %: 72 % (ref 43–77)
PLATELETS: 206 10*3/uL (ref 150.0–400.0)
Platelets: 202 10*3/uL (ref 150–400)
RBC: 4.38 Mil/uL (ref 3.87–5.11)
RBC: 4.28 MIL/uL (ref 3.87–5.11)
RDW: 15.6 % — ABNORMAL HIGH (ref 11.5–15.5)
RDW: 14.5 % (ref 11.5–15.5)
WBC: 9.1 10*3/uL (ref 4.0–10.5)
WBC: 10.3 10*3/uL (ref 4.0–10.5)

## 2014-09-19 LAB — POCT URINALYSIS DIPSTICK
Bilirubin, UA: NEGATIVE
Glucose, UA: NEGATIVE
KETONES UA: NEGATIVE
Nitrite, UA: NEGATIVE
PH UA: 6
SPEC GRAV UA: 1.02
Urobilinogen, UA: 0.2

## 2014-09-19 LAB — COMPREHENSIVE METABOLIC PANEL
ALBUMIN: 4.4 g/dL (ref 3.5–5.2)
ALK PHOS: 95 U/L (ref 39–117)
ALT: 19 U/L (ref 0–35)
ALT: 20 U/L (ref 0–35)
ANION GAP: 7 (ref 5–15)
AST: 15 U/L (ref 0–37)
AST: 18 U/L (ref 0–37)
Albumin: 4.1 g/dL (ref 3.5–5.2)
Alkaline Phosphatase: 86 U/L (ref 39–117)
BUN: 18 mg/dL (ref 6–23)
BUN: 18 mg/dL (ref 6–23)
CALCIUM: 9.3 mg/dL (ref 8.4–10.5)
CHLORIDE: 103 meq/L (ref 96–112)
CO2: 27 mEq/L (ref 19–32)
CO2: 28 mmol/L (ref 19–32)
Calcium: 8.8 mg/dL (ref 8.4–10.5)
Chloride: 104 mmol/L (ref 96–112)
Creatinine, Ser: 0.64 mg/dL (ref 0.40–1.20)
Creatinine, Ser: 0.6 mg/dL (ref 0.50–1.10)
GFR calc non Af Amer: >90 mL/min (ref >90)
GFR: 114.9 mL/min (ref >60.00)
Glucose, Bld: 87 mg/dL (ref 70–99)
Glucose, Bld: 93 mg/dL (ref 70–99)
Potassium: 4.4 mEq/L (ref 3.5–5.1)
Potassium: 3.8 mmol/L (ref 3.5–5.1)
Sodium: 136 mEq/L (ref 135–145)
Sodium: 139 mmol/L (ref 135–145)
TOTAL PROTEIN: 7.5 g/dL (ref 6.0–8.3)
Total Bilirubin: 0.4 mg/dL (ref 0.2–1.2)
Total Bilirubin: 0.5 mg/dL (ref 0.3–1.2)
Total Protein: 7.2 g/dL (ref 6.0–8.3)

## 2014-09-19 LAB — URINALYSIS, ROUTINE W REFLEX MICROSCOPIC
Bilirubin Urine: NEGATIVE
Glucose, UA: NEGATIVE mg/dL
Ketones, ur: NEGATIVE mg/dL
Nitrite: NEGATIVE
Protein, ur: NEGATIVE mg/dL
Specific Gravity, Urine: 1.025 (ref 1.005–1.030)
UROBILINOGEN UA: 1 mg/dL (ref 0.0–1.0)
pH: 6.5 (ref 5.0–8.0)

## 2014-09-19 LAB — URINE MICROSCOPIC-ADD ON

## 2014-09-19 LAB — LIPASE: LIPASE: 30 U/L (ref 11.0–59.0)

## 2014-09-19 LAB — PREGNANCY, URINE: PREG TEST UR: NEGATIVE

## 2014-09-19 LAB — LIPASE, BLOOD: LIPASE: 35 U/L (ref 11–59)

## 2014-09-19 LAB — AMYLASE: Amylase: 53 U/L (ref 27–131)

## 2014-09-19 MED ORDER — MORPHINE SULFATE 4 MG/ML IJ SOLN
4.0000 mg | Freq: Once | INTRAMUSCULAR | Status: DC
  Filled 2014-09-19: qty 1

## 2014-09-19 MED ORDER — MOXIFLOXACIN HCL 400 MG PO TABS: 400.0000 mg | ORAL_TABLET | Freq: Every day | ORAL | Status: DC

## 2014-09-19 MED ORDER — OXYCODONE-ACETAMINOPHEN 5-325 MG PO TABS: 1.0000 | ORAL_TABLET | Freq: Four times a day (QID) | ORAL | Status: DC | PRN

## 2014-09-19 MED ORDER — IOHEXOL 300 MG/ML  SOLN
25.0000 mL | Freq: Once | INTRAMUSCULAR | Status: AC | PRN
  Administered 2014-09-19: 25 mL via ORAL

## 2014-09-19 MED ORDER — IOHEXOL 300 MG/ML  SOLN
100.0000 mL | Freq: Once | INTRAMUSCULAR | Status: AC | PRN
  Administered 2014-09-19: 100 mL via INTRAVENOUS

## 2014-09-19 MED ORDER — SUCRALFATE 1 GM/10ML PO SUSP
1.0000 g | Freq: Three times a day (TID) | ORAL | Status: DC
Start: 2014-09-19 — End: 2015-10-16

## 2014-09-19 MED ORDER — OXYCODONE-ACETAMINOPHEN 5-325 MG PO TABS
1.0000 | ORAL_TABLET | Freq: Once | ORAL | Status: AC
  Administered 2014-09-19: 1 via ORAL
  Filled 2014-09-19: qty 1

## 2014-09-19 MED ORDER — ONDANSETRON HCL 4 MG/2ML IJ SOLN
4.0000 mg | Freq: Once | INTRAMUSCULAR | Status: DC
  Filled 2014-09-19: qty 2

## 2014-09-19 MED ORDER — CEFTRIAXONE SODIUM 1 G IJ SOLR
1.0000 g | Freq: Once | INTRAMUSCULAR | Status: AC
  Administered 2014-09-19: 1 g via INTRAMUSCULAR

## 2014-09-19 NOTE — Telephone Encounter (Signed)
Patient called stating that her pain is now worse and is hurting when she is walking.   Informed Layne of this verbally. She is going to consult with Dr. Milinda Cave.

## 2014-09-19 NOTE — ED Provider Notes (Signed)
CSN: [id number]     Arrival date & time 09/19/14  1542 History   First MD Initiated Contact with Patient 09/19/14 [phone]     Chief Complaint  Patient presents with  . Abdominal Pain     (Consider location/radiation/quality/duration/timing/severity/associated sxs/prior Treatment) HPI  This is a 29 year old female who presents with abdominal pain. Patient is currently 7 weeks postpartum from a normal spontaneous vaginal delivery. Patient reports that she has today she has had progressively worsening periumbilical pain that radiates to her back. Current pain is 4 out of 10 and it is worse with movement and walking. At this times pain can get to be 8 out of 10. She denies any associated vomiting or diarrhea. Was seen by her primary physician this morning and diagnosed with urinary tract infection. Does report intermittent dysuria and reports a known history of persistent hematuria. Patient is currently being treated for both yeast infection and an ulcer following delivery. She is being treated with Carafate. Patient states that her ulcer pain is different. Patient denies any fevers.  Of note, patient has had appendicitis twice (primary appendicitis followed by infection of residual stoma).  Past Medical History  Diagnosis Date  . Ovarian cyst, right     surgically removed. 29 y.o.  . Allergy     multiple food & environmental allergies (tree pollens & fruits)  . Arthritis     exercise & allergy induced  . Headache   . Asthma     inhaler last used 2015   Past Surgical History  Procedure Laterality Date  . Appendectomy    . 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 Adopted: Yes  . Alcohol abuse Neg Hx   . Arthritis Neg Hx   . Asthma Neg Hx   . Birth defects Neg Hx   . Cancer Neg Hx   . COPD Neg Hx   . Depression Neg Hx   . Diabetes Neg Hx   . Drug abuse Neg Hx   . Early  death Neg Hx   . Hearing loss Neg Hx   . Heart disease Neg Hx   . Hyperlipidemia Neg Hx   . Hypertension Neg Hx   . Kidney disease Neg Hx   . Learning disabilities Neg Hx   . Mental illness Neg Hx   . Mental retardation Neg Hx   . Miscarriages / Stillbirths Neg Hx   . Stroke Neg Hx   . Vision loss Neg Hx   . Varicose Veins Neg Hx    History  Substance Use Topics  . Smoking status: Never Smoker   . Smokeless tobacco: Not on file  . Alcohol Use: No   OB History    Gravida Para Term Preterm AB TAB SAB Ectopic Multiple Living   3 3 3       0 1     Review of Systems  Constitutional: Positive for appetite change. Negative for fever.  Respiratory: Negative for cough, chest tightness and shortness of breath.   Cardiovascular: Negative for chest pain.  Gastrointestinal: Positive for abdominal pain. Negative for nausea, vomiting, diarrhea and constipation.  Genitourinary: Positive for dysuria and hematuria.  Musculoskeletal: Negative for back pain.  Neurological: Negative for headaches.  Psychiatric/Behavioral: Negative for confusion.  All other systems reviewed and are negative.     Allergies  Amoxicillin-pot clavulanate; Other; Cephalexin; Latex; and  Perineal cleansing  Home Medications   Prior to Admission medications   Medication Sig Start Date End Date Taking? Authorizing Provider  fluconazole (DIFLUCAN) 150 MG tablet  09/15/14  Yes Historical Provider, MD  albuterol (PROVENTIL HFA;VENTOLIN HFA) 108 (90 BASE) MCG/ACT inhaler Use 2 puffs twice daily for 4-6 days, then PRN q6h cough. Patient taking differently: Inhale 2 puffs into the lungs every 4 (four) hours as needed for shortness of breath.  06/28/13   Kelle Darting, NP  hydrocortisone cream 1 % Apply topically 2 (two) times daily. Patient not taking: Reported on 08/19/2014 08/04/14   Huel Cote, MD  moxifloxacin (AVELOX) 400 MG tablet Take 1 tablet (400 mg total) by mouth daily at 8 pm. 09/19/14   Shon Baton,  MD  oxyCODONE-acetaminophen (PERCOCET/ROXICET) 5-325 MG per tablet Take 1-2 tablets by mouth every 6 (six) hours as needed for severe pain. 09/19/14   Shon Baton, MD  Prenatal Vit-Fe Fumarate-FA (PRENATAL MULTIVITAMIN) TABS tablet Take 1 tablet by mouth daily.    Historical Provider, MD  sucralfate (CARAFATE) 1 GM/10ML suspension Take 10 mLs (1 g total) by mouth 4 (four) times daily -  with meals and at bedtime. 09/19/14   Shon Baton, MD  SUMAtriptan (IMITREX) 50 MG tablet Take 1 to 2 T po within onset of headache. Repeat in 2 hours if necessary. Do not exceed more than 200 mg/day. Patient taking differently: Take 50-100 mg by mouth once as needed for migraine.  08/15/13   Kelle Darting, NP   BP 104/53 mmHg  Pulse 75  Temp(Src) 99.2 F (37.3 C) (Oral)  Resp 16  Ht 5\' 6"  (1.676 m)  Wt 143 lb (64.864 kg)  BMI 23.09 kg/m2  SpO2 100% Physical Exam  Constitutional: She is oriented to person, place, and time. She appears well-developed and well-nourished. No distress.  HENT:  Head: Normocephalic and atraumatic.  Cardiovascular: Normal rate, regular rhythm and normal heart sounds.   No murmur heard. Pulmonary/Chest: Effort normal and breath sounds normal. No respiratory distress. She has no wheezes.  Abdominal: Soft. Bowel sounds are normal. There is tenderness. There is no rebound and no guarding.  Periumbilical tenderness to palpation, voluntary guarding, no rebound  Musculoskeletal: She exhibits no edema.  Neurological: She is alert and oriented to person, place, and time.  Skin: Skin is warm and dry.  Psychiatric: She has a normal mood and affect.  Nursing note and vitals reviewed.   ED Course  Procedures (including critical care time) Labs Review Labs Reviewed  CBC WITH DIFFERENTIAL/PLATELET - Abnormal; Notable for the following:    Hemoglobin 11.9 (*)    All other components within normal limits  URINALYSIS, ROUTINE W REFLEX MICROSCOPIC - Abnormal; Notable for the  following:    Hgb urine dipstick MODERATE (*)    Leukocytes, UA SMALL (*)    All other components within normal limits  URINE MICROSCOPIC-ADD ON - Abnormal; Notable for the following:    Bacteria, UA FEW (*)    All other components within normal limits  COMPREHENSIVE METABOLIC PANEL  PREGNANCY, URINE  LIPASE, BLOOD    Imaging Review Ct Abdomen Pelvis W Contrast  09/19/2014   CLINICAL DATA:  Pt complains of periumbilical pain onset yesterday, worse today  EXAM: CT ABDOMEN AND PELVIS WITH CONTRAST  TECHNIQUE: Multidetector CT imaging of the abdomen and pelvis was performed using the standard protocol following bolus administration of intravenous contrast.  CONTRAST:  09/21/2014 OMNIPAQUE IOHEXOL 300 MG/ML SOLN, 50mL OMNIPAQUE  IOHEXOL 300 MG/ML SOLN  COMPARISON:  Abdomen ultrasound, 07/22/2010.  CT, 03/02/2001.  FINDINGS: Inflammatory changes surround a diverticulum which projects from the superior margin of the right transverse colon, which lies in the anterior central to low abdomen. There is no extraluminal air and no evidence of an abscess. There are few other scattered uninflamed diverticula along the mid transverse colon. No other colonic abnormality. There is a small anastomosis staple line along the posterior margin of the cecum likely from a previous appendectomy. Small bowel is unremarkable.  Clear lung bases.  Heart normal size.  Liver, spleen, gallbladder, pancreas, adrenal glands, kidneys, ureters, bladder: Marked pole.  Uterus and adnexa are unremarkable.  No pathologically enlarged lymph nodes.  No ascites.  No significant bony abnormality.  IMPRESSION: 1. Uncomplicated diverticulitis of the right transverse colon. This portion of the colon projects in the mid to low central abdomen anteriorly. There is no extraluminal air and no evidence of an abscess. 2. No other acute findings.   Electronically Signed   By: Amie Portland M.D.   On: 09/19/2014 19:14     EKG Interpretation None      MDM    Final diagnoses:  Diverticulitis of large intestine without perforation or abscess without bleeding   Patient presents with abdominal pain. No signs of peritonitis on exam but is tender over the umbilicus. Afebrile. Basic labwork obtained and largely reassuring. CT scan of the abdomen and pelvis obtained and shows uncompensated diverticulitis of the transverse colon. Colon projects over the mid central abdomen which is where the patient hurts. Feel this is the patient's source of pain. Patient declined pain medications while here. Discussed with patient pain control at home and antibiotics. She is breast-feeding and wishes to continue to breast-feed. Given this, alternatives to ciprofloxacin and Flagyl were sent out. Discussed with pharmacy. They state that moxifloxacin is likely the safest option given that the patient has an Augmentin allergy. Patient and her husband were updated. She was able to tolerate fluids prior to discharge.  Encouraged patient to follow-up with her primary care physician and OB/GYN.  Patient stated understanding.  After history, exam, and medical workup I feel the patient has been appropriately medically screened and is safe for discharge home. Pertinent diagnoses were discussed with the patient. Patient was given return precautions.    Shon Baton, MD 09/19/14 2024

## 2014-09-19 NOTE — Patient Instructions (Addendum)
I think you have urinary tract infection causing pain. Labs will help determine if there are other issues. Hold diflucan until you hear from me.  Continue to drink fluids every hour to keep up milk supply. You ned extra 500 calories daily to make milk.  I will be in touch this afternoon.

## 2014-09-19 NOTE — ED Notes (Addendum)
Pt reports periumbilical pain since yesterday - pt is breast feeding and is currently being treated for a stomach ulcer. Pt seen by her PCP Alben Spittle this morning - dx with UTI, given ABT IM, pain worsened around midday - pt called PCP, PCP advised her to present to ED with concerns for Peritonitis and advised pt have abd u/s - pt also started on Diflucan on Monday by your OB/GYN for ? Yeast around breast area - pt 6 weeks post partum. Pt noted abdominal pain started after beginning Diflucan.

## 2014-09-19 NOTE — Progress Notes (Signed)
Pre visit review using our clinic review tool, if applicable. No additional management support is needed unless otherwise documented below in the visit note. 

## 2014-09-19 NOTE — ED Notes (Signed)
CT has just brought oral contrast to pt to drink.

## 2014-09-19 NOTE — Discharge Instructions (Signed)
You have diverticulitis. This is likely the cause of your abdominal pain. In discussion with the pharmacist, your best alternative antibiotic is moxifloxacin. This is likely safe during breast-feeding. You need to follow-up with your OB/GYN and primary physician.  Diverticulitis Diverticulitis is when small pockets that have formed in your colon (large intestine) become infected or swollen. HOME CARE  Follow your doctor's instructions.  Follow a special diet if told by your doctor.  When you feel better, your doctor may tell you to change your diet. You may be told to eat a lot of fiber. Fruits and vegetables are good sources of fiber. Fiber makes it easier to poop (have bowel movements).  Take supplements or probiotics as told by your doctor.  Only take medicines as told by your doctor.  Keep all follow-up visits with your doctor. GET HELP IF:  Your pain does not get better.  You have a hard time eating food.  You are not pooping like normal. GET HELP RIGHT AWAY IF:  Your pain gets worse.  Your problems do not get better.  Your problems suddenly get worse.  You have a fever.  You keep throwing up (vomiting).  You have bloody or black, tarry poop (stool). MAKE SURE YOU:   Understand these instructions.  Will watch your condition.  Will get help right away if you are not doing well or get worse. Document Released: 11/09/2007 Document Revised: 05/28/2013 Document Reviewed: 04/17/2013 [facility] Patient Information 2015 [place], [name]. This information is not intended to replace advice given to you by your health care provider. Make sure you discuss any questions you have with your health care provider.

## 2014-09-19 NOTE — ED Notes (Signed)
MD at bedside. 

## 2014-09-19 NOTE — Progress Notes (Signed)
Subjective:     Alyssa Le is a 29 y.o. female who presents for evaluation of abdominal pain. She is lactating & 6 weeks post-partum, vaginal delivery. Onset was yesterday . Symptoms have been gradually worsening. The pain is described as aching, and is 5/10 in intensity. Pain is located in the periumbilical region without radiation.  Aggravating factors: movement.  Alleviating factors: none. Associated symptoms: anorexia, dysuria and hesitancy, strong urine odor in am, bilat flank pain. The patient denies constipation, diarrhea, fever, nausea and vomiting. She has Hx of appendectomy X 2. Last BM yesterday. She saw OB 1 week ago for dysuria & vag itching. She was given 2 doses diflucan. Itching resolved, dysuria persists. She was prescribed 10 days diflucan, 4 dys ago for cutaneous candida of nipples. She has had 7 doses diflucan.  She was treated for UTI 5 weeks ago by OB. Repeat urine culture 4 weeks ago had no growth & all symptoms had resolved. She c/o epigastric pain & mastitis 4 wks ago. Epigastric pain resolved with carafate. Mastitis resolved with conservative treatment w/out use of ABX.   The patient's history has been marked as reviewed and updated as appropriate.  Review of Systems Pertinent items are noted in HPI.     Objective:    BP 93/63 mmHg  Pulse 87  Temp(Src) 98.7 F (37.1 C) (Oral)  Ht 5\' 6"  (1.676 m)  Wt 143 lb (64.864 kg)  BMI 23.09 kg/m2  SpO2 99% General appearance: alert, cooperative, appears stated age and mild distress Head: Normocephalic, without obvious abnormality, atraumatic Eyes: negative findings: lids and lashes normal and conjunctivae and sclerae normal Back: bilat CVA tenderness, L worse than R.  Lungs: clear to auscultation bilaterally Heart: regular rate and rhythm, S1, S2 normal, no murmur, click, rub or gallop Abdomen: normal findings: no masses palpable, no organomegaly, spleen non-palpable and umbilicus normal and abnormal findings:  very  tender periumbilical, shifts weight gingerly on exam table Neurologic: Grossly normal    Assessment:Plan    1. Dysuria DD: UTI, Vagintis - POCT urinalysis dipstick-Pos blood, protein, leuks, SG 1.020 - Urine culture - cefTRIAXone (ROCEPHIN) injection 1 g; Inject 1 g into the muscle once.  2. Periumbilical abdominal pain , pyelonephritis, peritonitis, SE diflucan - CBC with Differential/Platelet - Comprehensive metabolic panel - Amylase - Lipase - cefTRIAXone (ROCEPHIN) injection 1 g; Inject 1 g into the muscle once.  May need imaging of abd, will wait for lab results for further f/u plan  3. Lactating mother Cipro, bactrim, macrobid not recommended while breast feeding

## 2014-09-19 NOTE — Telephone Encounter (Signed)
Called and instructed patient to go to the ER per Layne's request. Patient requested it be done outpatient but I informed her there is no great way of [place] following up with her with any scans that we would want to do because of the weekend coming up!

## 2014-09-19 NOTE — ED Notes (Signed)
Patient transported to CT 

## 2014-09-22 ENCOUNTER — Telehealth: Payer: Self-pay | Admitting: Nurse Practitioner

## 2014-09-22 NOTE — Telephone Encounter (Signed)
pls call pt: Ask if feeling better. Advise she has UTI also. The antibiotic prescribed in ER will cover infection. She should only be taking in liquids today-any beverage, smoothie (blended yogurt, berries, banana, juice), soups, anything liquid at room temp (jello, popsicle, sherbet). Tomorrow, if feeling better add soft foods-scrambled egg, steamed vegetables, soft fruits & berries, baked or bioled potatoes, rice, pasta, soft bread. NO meat or gas-producing foods (cabbage, starchy beans) until  all symptoms resolved. Pls sched OV mid to end of week for f/u.

## 2014-09-22 NOTE — Telephone Encounter (Signed)
Called and informed patient. States that she is feeling a little better, not 100% but she is getting better. OV made for 04/20 at 10:30a.

## 2014-09-23 LAB — URINE CULTURE: Colony Count: >=100000

## 2014-09-24 ENCOUNTER — Encounter: Payer: Self-pay | Admitting: Nurse Practitioner

## 2014-09-24 ENCOUNTER — Ambulatory Visit (INDEPENDENT_AMBULATORY_CARE_PROVIDER_SITE_OTHER): Payer: BLUE CROSS/BLUE SHIELD | Admitting: Nurse Practitioner

## 2014-09-24 VITALS — BP 94/59 | HR 74 | Temp 97.7°F | Ht 66.0 in | Wt 141.0 lb

## 2014-09-24 DIAGNOSIS — K5730 Diverticulosis of large intestine without perforation or abscess without bleeding: Secondary | ICD-10-CM

## 2014-09-24 DIAGNOSIS — N390 Urinary tract infection, site not specified: Secondary | ICD-10-CM

## 2014-09-24 DIAGNOSIS — R1013 Epigastric pain: Secondary | ICD-10-CM | POA: Diagnosis not present

## 2014-09-24 DIAGNOSIS — R319 Hematuria, unspecified: Secondary | ICD-10-CM

## 2014-09-24 DIAGNOSIS — K30 Functional dyspepsia: Secondary | ICD-10-CM | POA: Insufficient documentation

## 2014-09-24 HISTORY — DX: Diverticulosis of large intestine without perforation or abscess without bleeding: K57.30

## 2014-09-24 NOTE — Progress Notes (Signed)
Pre visit review using our clinic review tool, if applicable. No additional management support is needed unless otherwise documented below in the visit note. 

## 2014-09-24 NOTE — Progress Notes (Signed)
Subjective:     Alyssa Le is a 29 y.o. female presents f/u diverticulitis of transverse colon & UTI. She c/o worse indigestion. diverticulitis of transverse colon: confirmed by CT. Started moxifloxicin in er last week. Eating soft diet & liquids. Pain better-now 2/10 from 9/10. NO diarrhea, nausea, fever. UTI: urine cx confirmed e. Coli growth. Sensitive to floroquinolones. All symptoms resolved. worse indigestion: she has been taking carafate tid. Symptoms have gotten worse since she satrted diflucan for yeast infection on nipples & starting moxifloxicin. She is being very careful with diet, trying to avoid triggers. Started probiotic. She is lactating, so there are no PPI or H2 blockers that are considered safe. She took TUMS yesterday w/no relief. Siggest she call OB to see if they have any recommendations. Advised to stop diflucan & use topical for nipples. Discussed skin care. h pylori neg.  The following portions of the patient's history were reviewed and updated as appropriate: allergies, current medications, past medical history, past social history, past surgical history and problem list.  Review of Systems Pertinent items are noted in HPI.    Objective:    BP 94/59 mmHg  Pulse 74  Temp(Src) 97.7 F (36.5 C) (Oral)  Ht 5\' 6"  (1.676 m)  Wt 141 lb (63.957 kg)  BMI 22.77 kg/m2  SpO2 97% BP 94/59 mmHg  Pulse 74  Temp(Src) 97.7 F (36.5 C) (Oral)  Ht 5\' 6"  (1.676 m)  Wt 141 lb (63.957 kg)  BMI 22.77 kg/m2  SpO2 97% General appearance: alert, cooperative, appears stated age and no distress Head: Normocephalic, without obvious abnormality, atraumatic Eyes: negative findings: lids and lashes normal and conjunctivae and sclerae normal Abdomen: normal findings: no masses palpable and no organomegaly and abnormal findings:  tender RUQ, epigastric, & LUQ, bit no nearly as tender as 5 days ago. No longer feel fullness b/w epigastric region & umbilicus Neurologic: Grossly normal    Back: NO CVA tenderness   Assessment:Plan   1. Diverticular disease of right side of colon Improving Finish ABX Continue diet modifications Let know if pain not resolved in another week.   2. Urinary tract infection with hematuria, site unspecified resolved  3. Indigestion Worse Increase carafate to qid Continue probiotics No ppi or H2 blockers safe w/lactation Avoid food triggers Call if worse or no improvement in 3 weeks, may to stop nursing to use PPI

## 2014-09-24 NOTE — Patient Instructions (Signed)
Stop diflucan. Use topicals for nipple pain-wash with water before breast feeding. Stop using soap on nipples. Expose nipples to air & sunlight if able. Keep skin, bra, pads dry.  Increase carafate to 4 times daily.  Avoid tomato based foods, caffeine.  Diverticuli pain should be resolved in another week. Add meat once pain is gone.  Continue probiotics.  Let me know if indigestion & abdominal pain do not get better.

## 2014-10-30 ENCOUNTER — Telehealth: Payer: Self-pay | Admitting: Family Medicine

## 2014-10-30 NOTE — Telephone Encounter (Signed)
Pt states that she is beginning to have stomach pains again.  No other symptoms at this time.  She is just concerned about the long weekend coming up.  Please advise.

## 2014-10-30 NOTE — Telephone Encounter (Signed)
Called patient. Appointment made for tomorrow as she could not come in today

## 2014-10-30 NOTE — Telephone Encounter (Signed)
pls sched OV-need to do urine tests & exam

## 2014-10-31 ENCOUNTER — Ambulatory Visit (INDEPENDENT_AMBULATORY_CARE_PROVIDER_SITE_OTHER): Payer: BLUE CROSS/BLUE SHIELD | Admitting: Nurse Practitioner

## 2014-10-31 ENCOUNTER — Encounter: Payer: Self-pay | Admitting: Nurse Practitioner

## 2014-10-31 VITALS — BP 102/66 | HR 70 | Temp 98.1°F | Ht 66.0 in | Wt 140.0 lb

## 2014-10-31 DIAGNOSIS — R1033 Periumbilical pain: Secondary | ICD-10-CM | POA: Diagnosis not present

## 2014-10-31 DIAGNOSIS — K5730 Diverticulosis of large intestine without perforation or abscess without bleeding: Secondary | ICD-10-CM | POA: Diagnosis not present

## 2014-10-31 LAB — POCT URINALYSIS DIPSTICK
Bilirubin, UA: NEGATIVE
Glucose, UA: NEGATIVE
Ketones, UA: NEGATIVE
Leukocytes, UA: NEGATIVE
NITRITE UA: NEGATIVE
PH UA: 6
Spec Grav, UA: >=1.03
UROBILINOGEN UA: 0.2

## 2014-10-31 MED ORDER — MOXIFLOXACIN HCL 400 MG PO TABS: 400.0000 mg | ORAL_TABLET | Freq: Every day | ORAL | Status: DC

## 2014-10-31 NOTE — Patient Instructions (Addendum)
Please see GI.  Please start liquid (beverages, smoothies, soups, popsicles, sorbet) diet for 2 days, then advance to soft diet (scrmbled egg, steamed vegetables, fruits, berries, melons, bread, rice, pudding, yogurt, potatoes) if pain getting better.   Please start antibiotic if pain returns, becomes persistent, you develop fever, diarrhea, nausea, decreased appetite.  Continue probiotic.  OK to take antacids. Avoid aluminum antacids.  My office will call with urine culture results.  Urine is concentrated today-sip fluids every hour. Increase fluids when drinking coffee to avoid concentrated urine as it can be irritating to urethra.   See me in 1 week if you have to start antibiotic. Go to ER or urgent care over weekend if concerns: fever, worse pain, vomiting, persistent diarrhea.   Nice to see you!

## 2014-10-31 NOTE — Progress Notes (Signed)
Pre visit review using our clinic review tool, if applicable. No additional management support is needed unless otherwise documented below in the visit note. 

## 2014-10-31 NOTE — Progress Notes (Addendum)
Subjective:     Alyssa Le is a 29 y.o. female who presents for evaluation of periumbilical pain. She is 3 mos postpartum and is lactating. She scores 1 on Edinburgh postnatal Depression Scale. Onset was yesterday . Symptoms have been gradually improving-no pain today. The pain is described as aching, and is 5/10 in intensity. Pain is located in the periumbilical region without radiation.  Aggravating factors: none.  Alleviating factors: none. Associated symptoms: none. The patient denies anorexia, belching, chills, constipation, diarrhea, fever and nausea. Pt had episode of diverticulitis in transverse colon 1 month ago. Resolved with avelox & liquid diet.  She is also c/o heart burn-this is constant. She was taking carafate 4 times daily, but stopped as she didn't think it was helping. She doesn't want to take PPI due to lactating. She started probiotic last week. She is also c/o dysuria in afternoons after drinking coffee. Today her urine is concentrated per UA. She was treated for UTI 1 mo ago: urine cx pos for e.coli. She reports symptoms resolved.  The patient's history has been marked as reviewed and updated as appropriate.  Review of Systems Pertinent items are noted in HPI.     Objective:    BP 102/66 mmHg  Pulse 70  Temp(Src) 98.1 F (36.7 C) (Oral)  Ht 5\' 6"  (1.676 m)  Wt 140 lb (63.504 kg)  BMI 22.61 kg/m2  SpO2 98% General appearance: alert, cooperative, appears stated age and no distress Head: Normocephalic, without obvious abnormality, atraumatic Eyes: negative findings: lids and lashes normal and conjunctivae and sclerae normal Lungs: clear to auscultation bilaterally Heart: regular rate and rhythm, S1, S2 normal, no murmur, click, rub or gallop Abdomen: normal findings: no masses palpable and no organomegaly and abnormal findings:  tender at R periumbilical area Neurologic: Grossly normal    Assessment:Plan    1. Diverticulosis of large intestine without  hemorrhage Treated 1 mo ago Discussed conservative management: continue probiotic, liquid diet, advance as tolerates. Start ABX if pain returns, nausea, fever, anorexia. - Ambulatory referral to Gastroenterology  2. Periumbilical pain - POCT urinalysis dipstick-SG 1.030, mod blood, trace protein - Urine culture - moxifloxacin (AVELOX) 400 MG tablet; Take 1 tablet (400 mg total) by mouth daily at 8 pm.  Dispense: 7 tablet; Refill: 0  F/u 1 week if pain persists See pt instructions

## 2014-11-02 LAB — URINE CULTURE
Colony Count: NO GROWTH
ORGANISM ID, BACTERIA: NO GROWTH

## 2014-11-04 ENCOUNTER — Telehealth: Payer: Self-pay | Admitting: Nurse Practitioner

## 2014-11-04 NOTE — Telephone Encounter (Signed)
pls call pt: Advise No UTI. Ask if feeling better.

## 2014-11-04 NOTE — Telephone Encounter (Signed)
LMOVM-identified about lab results. Patient to call back with any questions or concerns/if not feeling better. DPR Signed.

## 2015-10-16 ENCOUNTER — Encounter: Payer: Self-pay | Admitting: Family Medicine

## 2015-10-16 ENCOUNTER — Ambulatory Visit (INDEPENDENT_AMBULATORY_CARE_PROVIDER_SITE_OTHER): Payer: BLUE CROSS/BLUE SHIELD | Admitting: Family Medicine

## 2015-10-16 VITALS — BP 100/62 | HR 89 | Temp 98.4°F | Resp 20 | Wt 127.2 lb

## 2015-10-16 DIAGNOSIS — R8290 Unspecified abnormal findings in urine: Secondary | ICD-10-CM | POA: Diagnosis not present

## 2015-10-16 DIAGNOSIS — R319 Hematuria, unspecified: Secondary | ICD-10-CM

## 2015-10-16 DIAGNOSIS — R350 Frequency of micturition: Secondary | ICD-10-CM | POA: Diagnosis not present

## 2015-10-16 DIAGNOSIS — N390 Urinary tract infection, site not specified: Secondary | ICD-10-CM | POA: Diagnosis not present

## 2015-10-16 LAB — URINALYSIS, ROUTINE W REFLEX MICROSCOPIC
Bilirubin Urine: NEGATIVE
KETONES UR: NEGATIVE
Nitrite: NEGATIVE
Specific Gravity, Urine: 1.02 (ref 1.000–1.030)
URINE GLUCOSE: NEGATIVE
Urobilinogen, UA: 0.2 (ref 0.0–1.0)
pH: 6 (ref 5.0–8.0)

## 2015-10-16 LAB — POC URINALSYSI DIPSTICK (AUTOMATED)
BILIRUBIN UA: NEGATIVE
Glucose, UA: NEGATIVE
KETONES UA: NEGATIVE
Nitrite, UA: NEGATIVE
Spec Grav, UA: >=1.03
Urobilinogen, UA: 0.2
pH, UA: 6

## 2015-10-16 MED ORDER — NITROFURANTOIN MONOHYD MACRO 100 MG PO CAPS: 100.0000 mg | ORAL_CAPSULE | Freq: Two times a day (BID) | ORAL | Status: DC

## 2015-10-16 NOTE — Progress Notes (Signed)
Patient ID: Alyssa Le, female   DOB: Sep 16, 1985, 30 y.o.   MRN: [id number]    Alyssa Le , 1985-11-26, 30 y.o., female MRN: [id number]  CC: UTI Subjective: Pt presents for an acute OV with complaints of UTI symptoms of 3 days duration. Associated symptoms include dysuria, with feelings of incomplete empty. She reports feeling more fatigued. She denies nausea, vomit. She states she is urinating appropriately. Denies any low back pain, fevers or chills. He is eating and drinking well. She is breast-feeding her child is greater than 2 months old. She has had a UTI in the past that was Escherichia coli pansensitive. She denies any kidney stone history.  Allergies  Allergen Reactions  . Amoxicillin-Pot Clavulanate Shortness Of Breath and Other (See Comments)    Reaction:  Chest pain  - pt cannot tolerate Augmentin - but can take Amoxicillin  . Other Anaphylaxis and Other (See Comments)    Pt states that she is allergic to fresh fruit and tree nuts.    . Cephalexin Rash  . Latex Rash  . Perineal Cleansing [Balneol] Rash   Social History  Substance Use Topics  . Smoking status: Never Smoker   . Smokeless tobacco: Not on file  . Alcohol Use: No   Past Medical History  Diagnosis Date  . Ovarian cyst, right     surgically removed. 30 y.o.  . Allergy     multiple food & environmental allergies (tree pollens & fruits)  . Arthritis     exercise & allergy induced  . Headache   . Asthma     inhaler last used 2015   Past Surgical History  Procedure Laterality Date  . Appendectomy    . 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 Adopted: Yes  . Alcohol abuse Neg Hx   . Arthritis Neg Hx   . Asthma Neg Hx   . Birth defects Neg Hx   . Cancer Neg Hx   . COPD Neg Hx   . Depression Neg Hx   . Diabetes Neg Hx   . Drug abuse Neg Hx   . Early death Neg Hx   . Hearing loss  Neg Hx   . Heart disease Neg Hx   . Hyperlipidemia Neg Hx   . Hypertension Neg Hx   . Kidney disease Neg Hx   . Learning disabilities Neg Hx   . Mental illness Neg Hx   . Mental retardation Neg Hx   . Miscarriages / Stillbirths Neg Hx   . Stroke Neg Hx   . Vision loss Neg Hx   . Varicose Veins Neg Hx      Medication List       This list is accurate as of: 10/16/15  2:05 PM.  Always use your most recent med list.               albuterol 108 (90 Base) MCG/ACT inhaler  Commonly known as:  PROVENTIL HFA;VENTOLIN HFA  Use 2 puffs twice daily for 4-6 days, then PRN q6h cough.     hydrocortisone cream 1 %  Apply topically 2 (two) times daily.     SUMAtriptan 50 MG tablet  Commonly known as:  IMITREX  Take 1 to 2 T po within onset of headache. Repeat in 2 hours if necessary. Do not exceed more than 200 mg/day.  ROS: Negative, with the exception of above mentioned in HPI  Objective:  BP 100/62 mmHg  Pulse 89  Temp(Src) 98.4 F (36.9 C)  Resp 20  Wt 127 lb 4 oz (57.72 kg)  SpO2 98%  LMP 10/11/2015  Breastfeeding? Yes Body mass index is 20.55 kg/(m^2). Gen: Afebrile. No acute distress. Nontoxic in appearance, well-developed, well-nourished female. Very pleasant. Breast-feeding. HENT: AT. [place]. MMM, no oral lesions. Eyes:Pupils Equal Round Reactive to light, Extraocular movements intact,  Conjunctiva without redness, discharge or icterus. CV: RRR  Abd: Soft. Flat.ND. Suprapubic pressure. BS present.  MSK: No CVA tenderness  Neuro: Normal gait. PERLA. EOMi. Alert. Oriented x3    Assessment/Plan: Alyssa Le is a 30 y.o. female present for acute OV for  Frequent urination, likely UTI. Breast-feeding mother. Allergic to amoxicillin and Keflex. - POCT Urinalysis Dipstick (Automated): Positive leuks, positive RBC - Urinalysis, Routine w reflex microscopic - Urine Culture - nitrofurantoin, macrocrystal-monohydrate, (MACROBID) 100 MG capsule; Take 1 capsule (100 mg  total) by mouth 2 (two) times daily.  Dispense: 14 capsule; Refill: 0 - Discussed small amount of Macrobid excreted into breast milk, usually not considered a contraindication and less greater than 10% which it is not. - follow-up when necessary electronically signed by:  Felix Pacini, DO  [facility] [place]

## 2015-10-16 NOTE — Patient Instructions (Signed)

## 2015-10-19 ENCOUNTER — Telehealth: Payer: Self-pay | Admitting: Family Medicine

## 2015-10-19 DIAGNOSIS — N390 Urinary tract infection, site not specified: Secondary | ICD-10-CM

## 2015-10-19 DIAGNOSIS — R319 Hematuria, unspecified: Principal | ICD-10-CM

## 2015-10-19 LAB — URINE CULTURE

## 2015-10-19 MED ORDER — CIPROFLOXACIN HCL 500 MG PO TABS: 500.0000 mg | ORAL_TABLET | Freq: Every day | ORAL | Status: DC

## 2015-10-19 NOTE — Telephone Encounter (Signed)
Please call pt: - her urine culture has grown Klebsiella (diffiernet than prior) and it is resistant to the current antibiotic, therefore we have to switch antibiotic for her.  Allergic: Amoxicillin and keflex Breastfeeding mother.  I have called in cipro, once a day for 5 days. There is a small amount of cipro excreted in breast milk, but not enough to harm the child.

## 2015-10-19 NOTE — Telephone Encounter (Signed)
Spoke with patient reviewed results and instructions patient verbalized understanding. 

## 2015-12-01 ENCOUNTER — Ambulatory Visit: Payer: BLUE CROSS/BLUE SHIELD | Admitting: Family Medicine

## 2016-02-15 IMAGING — CT CT ABD-PELV W/ CM
2 of 4 series · 17 of 46 positions shown, 19 images · IV contrast (APPLIED)
Comparison: Abdomen ultrasound, 07/22/2010.  CT, 03/02/2001.

CLINICAL DATA: Pt complains of periumbilical pain onset yesterday,
worse today

EXAM:
CT ABDOMEN AND PELVIS WITH CONTRAST
TECHNIQUE: Multidetector CT imaging of the abdomen and pelvis was performed
using the standard protocol following bolus administration of
intravenous contrast.
CONTRAST:  100mL OMNIPAQUE IOHEXOL 300 MG/ML SOLN, 25mL OMNIPAQUE
IOHEXOL 300 MG/ML SOLN

[Series 2: abd/pelvis 5.0 b31f · axial · 0.74mm/px · z∈[-481,-71]mm · 14 of 90 slices shown, 16 images]
[im 4/90  soft-tissue]
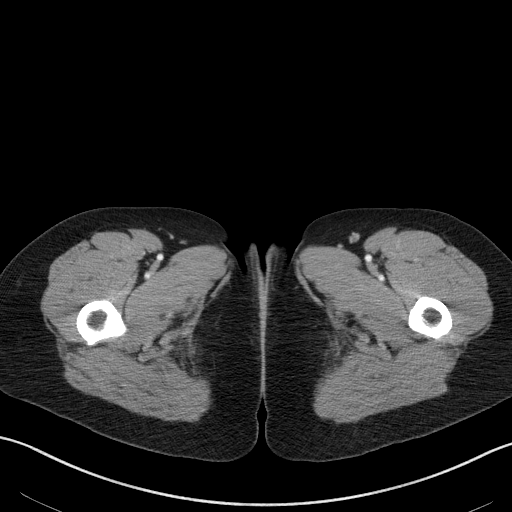
[im 4/90  bone]
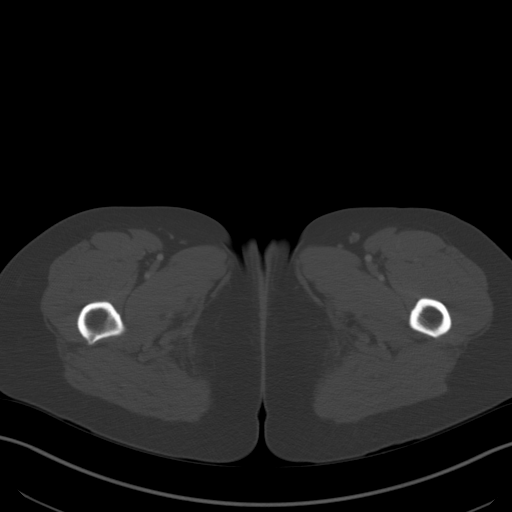
[im 11/90  soft-tissue]
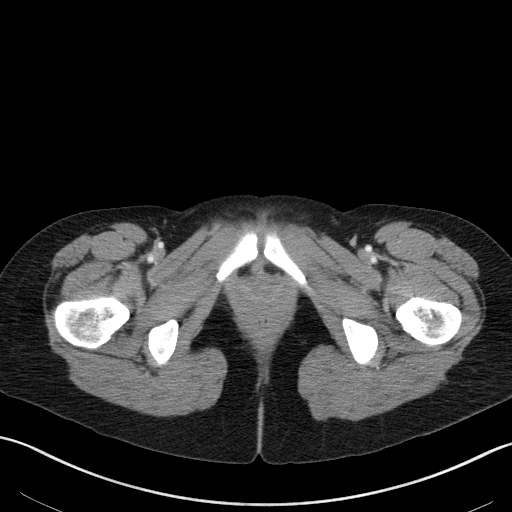
[im 18/90  soft-tissue]
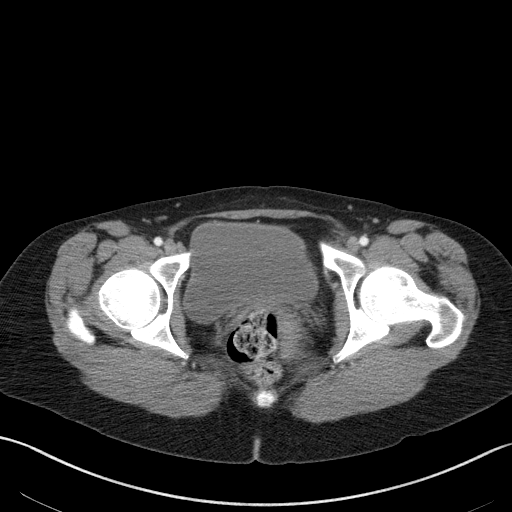
[im 24/90  soft-tissue]
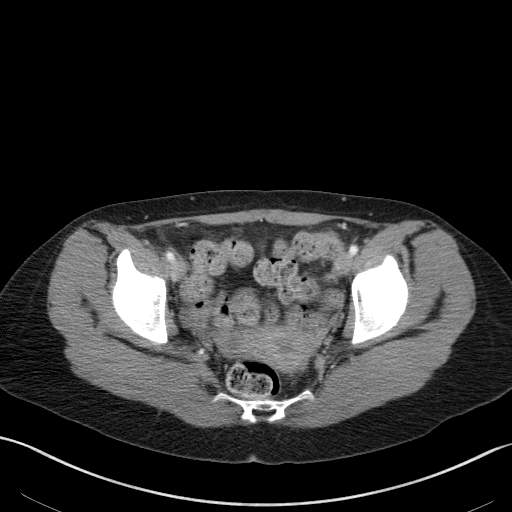
[im 31/90  soft-tissue]
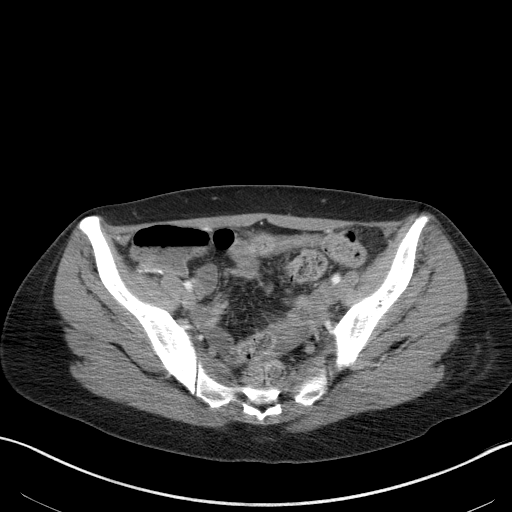
[im 35/90  soft-tissue]
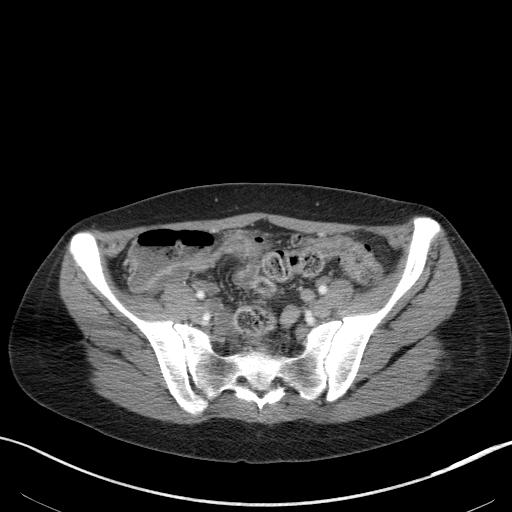
[im 42/90  soft-tissue]
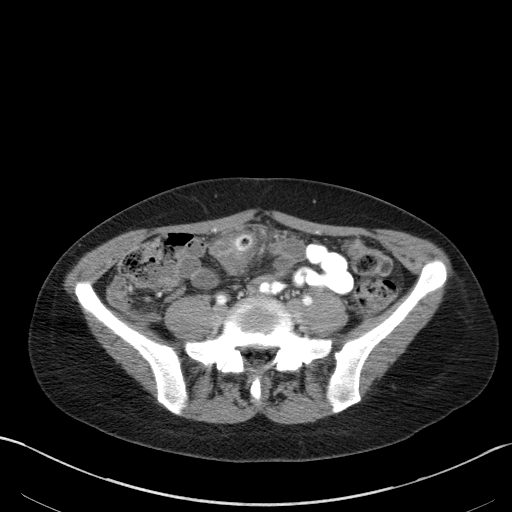
[im 48/90  soft-tissue]
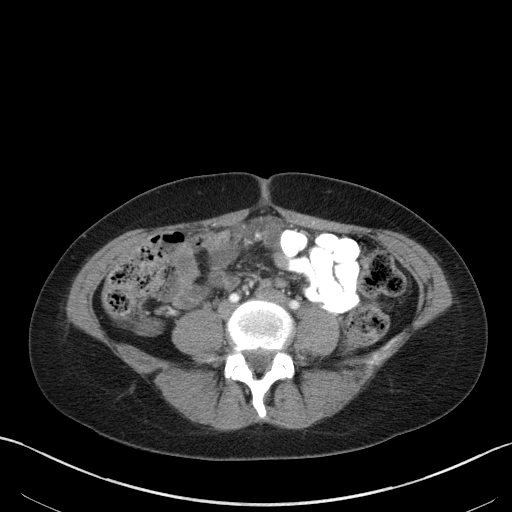
[im 55/90  soft-tissue]
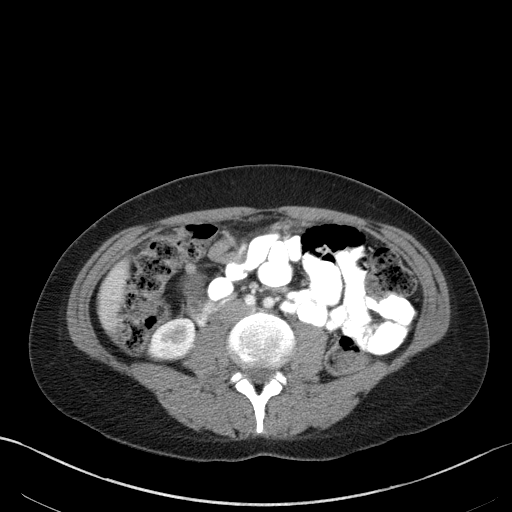
[im 55/90  bone]
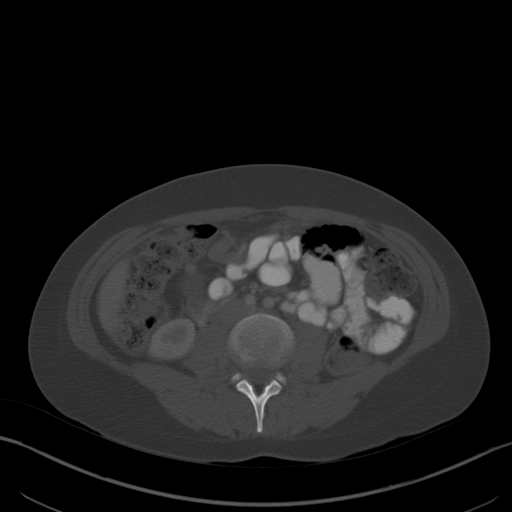
[im 59/90  soft-tissue]
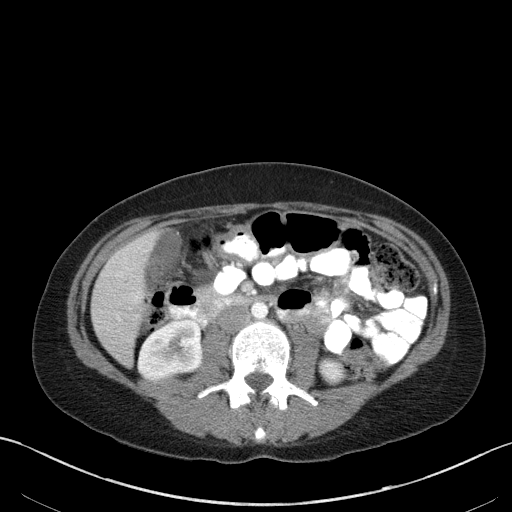
[im 66/90  soft-tissue]
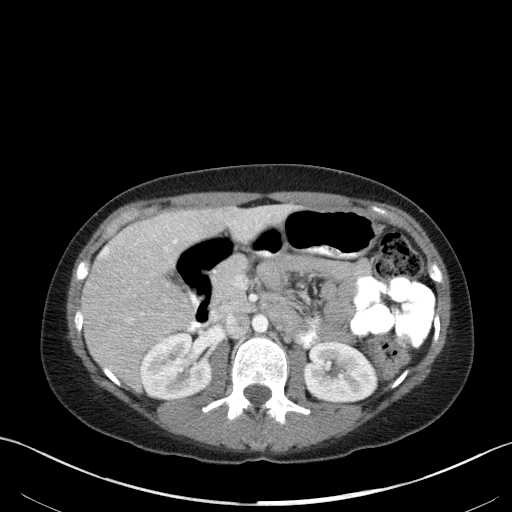
[im 72/90  soft-tissue]
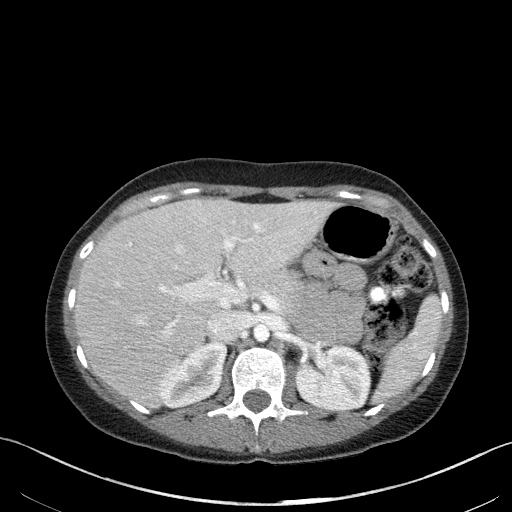
[im 79/90  soft-tissue]
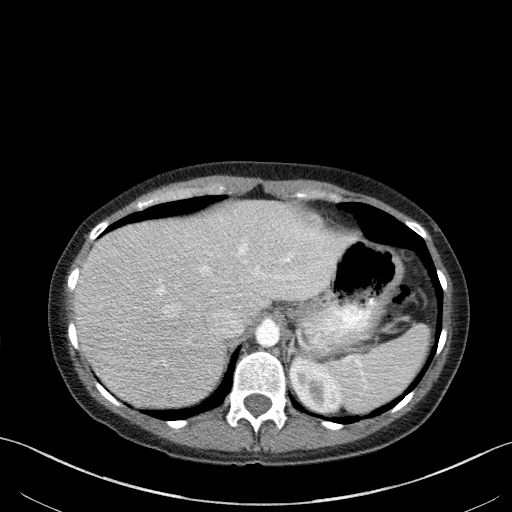
[im 86/90  soft-tissue]
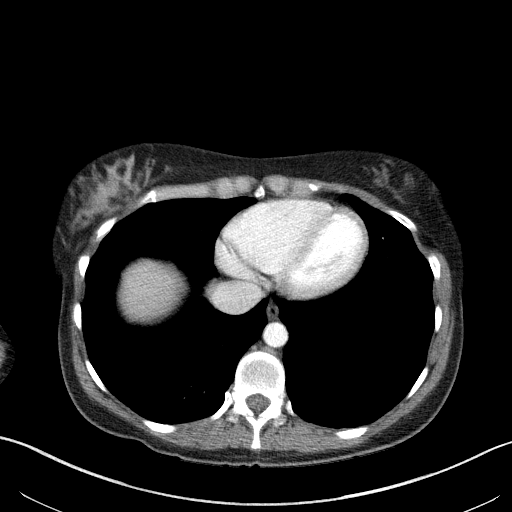

[Series 5: abd/pelvis 3.0 coronal · coronal · 0.86mm/px · 3 of 71 slices shown]
[im 24/71  soft-tissue]
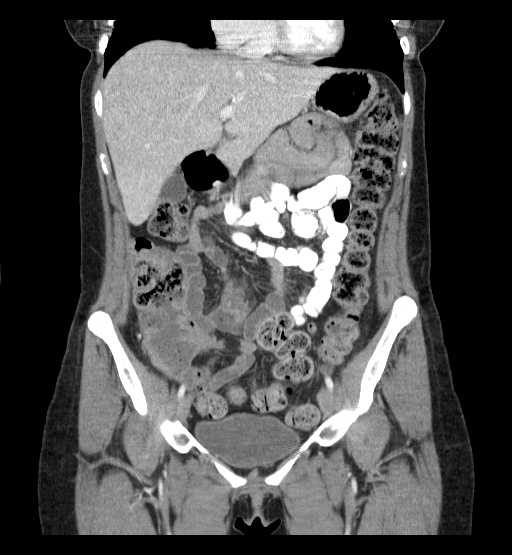
[im 32/71  soft-tissue]
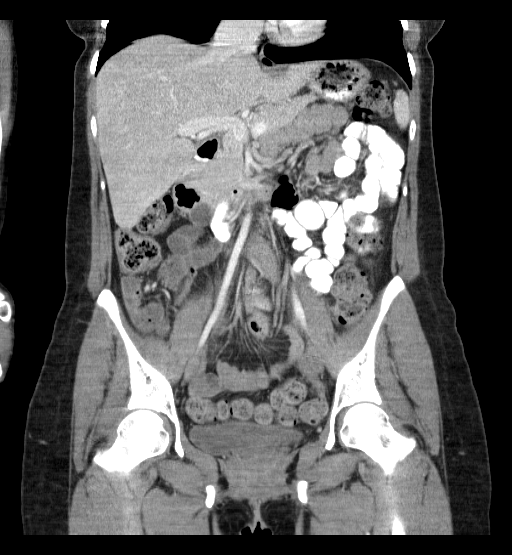
[im 39/71  soft-tissue]
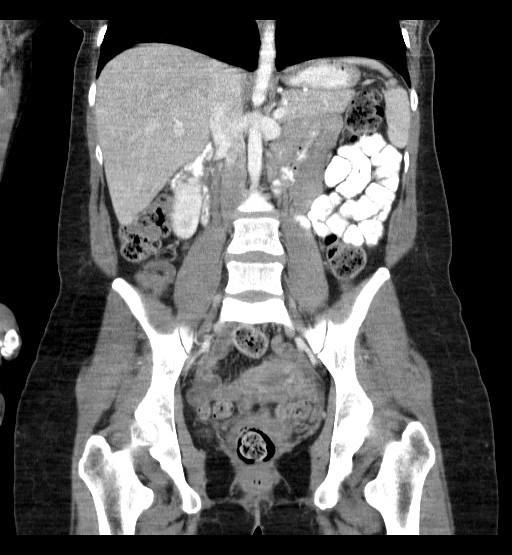

[17 of 46 positions shown; findings below may reference images not displayed]

FINDINGS: Inflammatory changes surround a diverticulum which projects from the
superior margin of the right transverse colon, which lies in the
anterior central to low abdomen. There is no extraluminal air and no
evidence of an abscess. There are few other scattered uninflamed
diverticula along the mid transverse colon. No other colonic
abnormality. There is a small anastomosis staple line along the
posterior margin of the cecum likely from a previous appendectomy.
Small bowel is unremarkable.

Clear lung bases.  Heart normal size.

Liver, spleen, gallbladder, pancreas, adrenal glands, kidneys,
ureters, bladder: Marked pole.

Uterus and adnexa are unremarkable.

No pathologically enlarged lymph nodes.  No ascites.

No significant bony abnormality.
IMPRESSION: 1. Uncomplicated diverticulitis of the right transverse colon. This
portion of the colon projects in the mid to low central abdomen
anteriorly. There is no extraluminal air and no evidence of an
abscess.
2. No other acute findings.

## 2016-04-21 ENCOUNTER — Ambulatory Visit (INDEPENDENT_AMBULATORY_CARE_PROVIDER_SITE_OTHER): Payer: BLUE CROSS/BLUE SHIELD | Admitting: Physician Assistant

## 2016-04-21 ENCOUNTER — Encounter: Payer: Self-pay | Admitting: Physician Assistant

## 2016-04-21 VITALS — BP 102/60 | HR 70 | Temp 98.2°F | Resp 14 | Ht 66.0 in | Wt 130.0 lb

## 2016-04-21 DIAGNOSIS — R300 Dysuria: Secondary | ICD-10-CM

## 2016-04-21 LAB — POCT URINALYSIS DIPSTICK
Bilirubin, UA: NEGATIVE
Glucose, UA: NEGATIVE
Ketones, UA: NEGATIVE
Nitrite, UA: NEGATIVE
PH UA: 6
PROTEIN UA: 30
SPEC GRAV UA: 1.015
UROBILINOGEN UA: 0.2

## 2016-04-21 MED ORDER — NITROFURANTOIN MONOHYD MACRO 100 MG PO CAPS: 100.0000 mg | ORAL_CAPSULE | Freq: Two times a day (BID) | ORAL | 0 refills | Status: DC

## 2016-04-21 NOTE — Patient Instructions (Signed)
Your symptoms are consistent with a bladder infection, also called acute cystitis. Please take your antibiotic (Macrobid) as directed until all pills are gone.  Stay very well hydrated.  Consider a daily probiotic (Align, Culturelle, or Activia) to help prevent stomach upset caused by the antibiotic.  Taking a probiotic daily may also help prevent recurrent UTIs.  I will call you with your urine testing results.  We will change antibiotics if indicated.  Call or return to clinic if symptoms are not resolved by completion of antibiotic.   Urinary Tract Infection A urinary tract infection (UTI) can occur any place along the urinary tract. The tract includes the kidneys, ureters, bladder, and urethra. A type of germ called bacteria often causes a UTI. UTIs are often helped with antibiotic medicine.  HOME CARE   If given, take antibiotics as told by your doctor. Finish them even if you start to feel better.  Drink enough fluids to keep your pee (urine) clear or pale yellow.  Avoid tea, drinks with caffeine, and bubbly (carbonated) drinks.  Pee often. Avoid holding your pee in for a long time.  Pee before and after having sex (intercourse).  Wipe from front to back after you poop (bowel movement) if you are a woman. Use each tissue only once. GET HELP RIGHT AWAY IF:   You have back pain.  You have lower belly (abdominal) pain.  You have chills.  You feel sick to your stomach (nauseous).  You throw up (vomit).  Your burning or discomfort with peeing does not go away.  You have a fever.  Your symptoms are not better in 3 days. MAKE SURE YOU:   Understand these instructions.  Will watch your condition.  Will get help right away if you are not doing well or get worse. Document Released: 11/09/2007 Document Revised: 02/15/2012 Document Reviewed: 12/22/2011 [facility] Patient Information 2015 [place], [name]. This information is not intended to replace advice given to you by your health  care provider. Make sure you discuss any questions you have with your health care provider.

## 2016-04-21 NOTE — Progress Notes (Signed)
Pre visit review using our clinic review tool, if applicable. No additional management support is needed unless otherwise documented below in the visit note. 

## 2016-04-21 NOTE — Progress Notes (Signed)
Patient presents to clinic today c/o 3 days of urinary urgency, frequency and dysuria. Endorses chronic hematuria with negative urology workup. Denies noted gross hematuria. Denies fever, chills. Denies nausea or vomiting. Notes lower back pain. Denies vaginal symptoms. Patient with + hx of UTI. Is currently breastfeeding her > 6 months old.   Past Medical History:  Diagnosis Date  . Allergy    multiple food & environmental allergies (tree pollens & fruits)  . Arthritis    exercise & allergy induced  . Asthma    inhaler last used 2015  . Headache   . Ovarian cyst, right    surgically removed. 30 y.o.    Current Outpatient Prescriptions on File Prior to Visit  Medication Sig Dispense Refill  . albuterol (PROVENTIL HFA;VENTOLIN HFA) 108 (90 BASE) MCG/ACT inhaler Use 2 puffs twice daily for 4-6 days, then PRN q6h cough. (Patient taking differently: Inhale 2 puffs into the lungs every 4 (four) hours as needed for shortness of breath. ) 1 Inhaler 0   No current facility-administered medications on file prior to visit.     Allergies  Allergen Reactions  . Amoxicillin-Pot Clavulanate Shortness Of Breath and Other (See Comments)    Reaction:  Chest pain  - pt cannot tolerate Augmentin - but can take Amoxicillin  . Other Anaphylaxis and Other (See Comments)    Pt states that she is allergic to fresh fruit and tree nuts.    . Cephalexin Rash  . Latex Rash  . Perineal Cleansing [Balneol] Rash    Family History  Problem Relation Age of Onset  . Adopted: Yes  . Alcohol abuse Neg Hx   . Arthritis Neg Hx   . Asthma Neg Hx   . Birth defects Neg Hx   . Cancer Neg Hx   . COPD Neg Hx   . Depression Neg Hx   . Diabetes Neg Hx   . Drug abuse Neg Hx   . Early death Neg Hx   . Hearing loss Neg Hx   . Heart disease Neg Hx   . Hyperlipidemia Neg Hx   . Hypertension Neg Hx   . Kidney disease Neg Hx   . Learning disabilities Neg Hx   . Mental illness Neg Hx   . Mental retardation Neg  Hx   . Miscarriages / Stillbirths Neg Hx   . Stroke Neg Hx   . Vision loss Neg Hx   . Varicose Veins Neg Hx     Social History   Social History  . Marital status: Married    Spouse name: N/A  . Number of children: 2  . Years of education: N/A   Occupational History  . 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 Smoking status: Never Smoker  . Smokeless tobacco: Never Used  . Alcohol use No  . Drug use: No  . Sexual activity: Yes     Comment: preg   Other Topics Concern  . None   Social History Narrative   Ms. Bucknam is from [place]. She lives with her husband, 2 small children, and mother-in-law.   She works part-time.    Review of Systems - See HPI.  All other ROS are negative.  BP 102/60   Pulse 70   Temp 98.2 F (36.8 C) (Oral)   Resp 14   Ht 5\' 6"  (1.676 m)   Wt 130 lb (59 kg)   SpO2 98%  BMI 20.98 kg/m   Physical Exam  Constitutional: She is oriented to person, place, and time and well-developed, well-nourished, and in no distress.  HENT:  Head: Normocephalic and atraumatic.  Eyes: Conjunctivae are normal.  Neck: Neck supple.  Cardiovascular: Normal rate, regular rhythm, normal heart sounds and intact distal pulses.   Pulmonary/Chest: Effort normal and breath sounds normal. No respiratory distress. She has no wheezes. She has no rales. She exhibits no tenderness.  Abdominal: Soft. Bowel sounds are normal. There is no tenderness.  Negative CVA tenderness.  Neurological: She is alert and oriented to person, place, and time.  Skin: Skin is warm and dry. No rash noted.  Psychiatric: Affect normal.  Vitals reviewed.  No results found for this or any previous visit (from the past 2160 hour(s)).  Assessment/Plan: 1. Dysuria Urine dip + LE and blood. Patient with chronic benign hematuria. Will send for culture. Giving history and classic symptoms, will start ABX. Patient is breastfeeding. Was given  Macrobid at time of last UTI and notes she and infant tolerated well. RX sent. Supportive measures reviewed. Will alter regimen based on results.  - POCT Urinalysis Dipstick - Urine Culture   Piedad Climes, PA-C

## 2016-04-23 LAB — URINE CULTURE

## 2016-04-25 ENCOUNTER — Telehealth: Payer: Self-pay | Admitting: Nurse Practitioner

## 2016-04-25 NOTE — Telephone Encounter (Signed)
Patient aware of urine culture showing infection and provided antibiotic is sensitive. She is not having any symptoms at this time. She will let us know if she develops symptoms.

## 2016-04-25 NOTE — Telephone Encounter (Signed)
Patient returning call to Patina.  Please call patient back on her cell, [phone] or she may be back at work when you call if so, please call work number, [phone].

## 2016-05-06 ENCOUNTER — Telehealth: Payer: Self-pay | Admitting: Physician Assistant

## 2016-05-06 NOTE — Telephone Encounter (Signed)
Please advise 

## 2016-05-06 NOTE — Telephone Encounter (Signed)
She would need reassessment in office or would need to request from her PCP. Saturday clinic or e-visits are also options.

## 2016-05-06 NOTE — Telephone Encounter (Signed)
FYI: Advised patient since its been so long since last treated she will need to be re-evaluated. Offered an 11:45 appointment for Saturday clinic but patient declined due to family scheduling. She states she will wait and monitor symptoms and if becomes worst will go to walk-in clinic or call back next week to schedule an appointment.

## 2016-05-06 NOTE — Telephone Encounter (Signed)
Pt states that uti has came back and asking if any possible that something be called in for her, [facility] on [address].

## 2016-07-01 ENCOUNTER — Ambulatory Visit (INDEPENDENT_AMBULATORY_CARE_PROVIDER_SITE_OTHER): Payer: BLUE CROSS/BLUE SHIELD | Admitting: Family Medicine

## 2016-07-01 ENCOUNTER — Encounter: Payer: Self-pay | Admitting: Family Medicine

## 2016-07-01 VITALS — BP 117/78 | HR 103 | Temp 99.0°F | Resp 20 | Wt 128.0 lb

## 2016-07-01 DIAGNOSIS — J111 Influenza due to unidentified influenza virus with other respiratory manifestations: Secondary | ICD-10-CM

## 2016-07-01 DIAGNOSIS — J0100 Acute maxillary sinusitis, unspecified: Secondary | ICD-10-CM | POA: Diagnosis not present

## 2016-07-01 DIAGNOSIS — R6889 Other general symptoms and signs: Secondary | ICD-10-CM | POA: Diagnosis not present

## 2016-07-01 LAB — POC INFLUENZA A&B (BINAX/QUICKVUE)
INFLUENZA A, POC: NEGATIVE
INFLUENZA B, POC: POSITIVE — AB

## 2016-07-01 MED ORDER — AMOXICILLIN 500 MG PO CAPS: 500.0000 mg | ORAL_CAPSULE | Freq: Three times a day (TID) | ORAL | 0 refills | Status: DC

## 2016-07-01 NOTE — Patient Instructions (Signed)
You are influenza B positive. It is past the window to treat with antiviral.  You do now have a sinus infection that should be treated.  I have called amoxacillin since you are able to take it.  Rest, hydrate, tylenol if needed.     Influenza, Adult Influenza ("the flu") is an infection in the lungs, nose, and throat (respiratory tract). It is caused by a virus. The flu causes many common cold symptoms, as well as a high fever and body aches. It can make you feel very sick. The flu spreads easily from person to person (is contagious). Getting a flu shot (influenza vaccination) every year is the best way to prevent the flu. Follow these instructions at home:  Take over-the-counter and prescription medicines only as told by your doctor.  Use a cool mist humidifier to add moisture (humidity) to the air in your home. This can make it easier to breathe.  Rest as needed.  Drink enough fluid to keep your pee (urine) clear or pale yellow.  Cover your mouth and nose when you cough or sneeze.  Wash your hands with soap and water often, especially after you cough or sneeze. If you cannot use soap and water, use hand sanitizer.  Stay home from work or school as told by your doctor. Unless you are visiting your doctor, try to avoid leaving home until your fever has been gone for 24 hours without the use of medicine.  Keep all follow-up visits as told by your doctor. This is important. How is this prevented?  Getting a yearly (annual) flu shot is the best way to avoid getting the flu. You may get the flu shot in late summer, fall, or winter. Ask your doctor when you should get your flu shot.  Wash your hands often or use hand sanitizer often.  Avoid contact with people who are sick during cold and flu season.  Eat healthy foods.  Drink plenty of fluids.  Get enough sleep.  Exercise regularly. Contact a doctor if:  You get new symptoms.  You have:  Chest pain.  Watery poop  (diarrhea).  A fever.  Your cough gets worse.  You start to have more mucus.  You feel sick to your stomach (nauseous).  You throw up (vomit). Get help right away if:  You start to be short of breath or have trouble breathing.  Your skin or nails turn a bluish color.  You have very bad pain or stiffness in your neck.  You get a sudden headache.  You get sudden pain in your face or ear.  You cannot stop throwing up. This information is not intended to replace advice given to you by your health care provider. Make sure you discuss any questions you have with your health care provider. Document Released: 03/01/2008 Document Revised: 10/29/2015 Document Reviewed: 03/17/2015 Elsevier Interactive Patient Education  2017 [employer].

## 2016-07-01 NOTE — Progress Notes (Signed)
Alyssa Le , 01/26/1986, 31 y.o., female MRN: [id number] Patient Care Team    Relationship Specialty Notifications Start End  Natalia Leatherwood, DO PCP - General Family Medicine  04/27/16     CC: Cough Subjective: Pt presents for an acute OV with complaints of cough of 3 weeks duration.  Associated symptoms include congestion, cough, fever and nausea. She states that she's had a upper respiratory infection off and on for the past 3-4 week. In approximately 10 days ago she started noticing a more productive cough. This past Tuesday body aches, sinus pain, cough fever of 100.9 and nausea started. She denies any vomiting, diarrhea, headache, sore throat, ear pressure or diarrhea.. Her daughter tested positive for flu a few days ago. She is a breast-feeding mother. Allergies  Allergen Reactions  . Amoxicillin-Pot Clavulanate Shortness Of Breath and Other (See Comments)    Reaction:  Chest pain  - pt cannot tolerate Augmentin - but can take Amoxicillin  . Other Anaphylaxis and Other (See Comments)    Pt states that she is allergic to fresh fruit and tree nuts.    . Cephalexin Rash  . Latex Rash  . Perineal Cleansing [Balneol] Rash   Social History  Substance Use Topics  . Smoking status: Never Smoker  . Smokeless tobacco: Never Used  . Alcohol use No   Past Medical History:  Diagnosis Date  . Allergy    multiple food & environmental allergies (tree pollens & fruits)  . Arthritis    exercise & allergy induced  . Asthma    inhaler last used 2015  . Headache   . Ovarian cyst, right    surgically removed. 31 y.o.   Past Surgical History:  Procedure Laterality Date  . APPENDECTOMY    . 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 Adopted: Yes  . Alcohol abuse Neg Hx   . Arthritis Neg Hx   . Asthma Neg Hx   . Birth defects Neg Hx   . Cancer Neg Hx   . COPD Neg Hx     . Depression Neg Hx   . Diabetes Neg Hx   . Drug abuse Neg Hx   . Early death Neg Hx   . Hearing loss Neg Hx   . Heart disease Neg Hx   . Hyperlipidemia Neg Hx   . Hypertension Neg Hx   . Kidney disease Neg Hx   . Learning disabilities Neg Hx   . Mental illness Neg Hx   . Mental retardation Neg Hx   . Miscarriages / Stillbirths Neg Hx   . Stroke Neg Hx   . Vision loss Neg Hx   . Varicose Veins Neg Hx    Allergies as of 07/01/2016      Reactions   Amoxicillin-pot Clavulanate Shortness Of Breath, Other (See Comments)   Reaction:  Chest pain  - pt cannot tolerate Augmentin - but can take Amoxicillin   Other Anaphylaxis, Other (See Comments)   Pt states that she is allergic to fresh fruit and tree nuts.     Cephalexin Rash   Latex Rash   Perineal Cleansing [balneol] Rash      Medication List       Accurate as of 07/01/16  9:36 AM. Always use your most recent med list.  albuterol 108 (90 Base) MCG/ACT inhaler Commonly known as:  PROVENTIL HFA;VENTOLIN HFA Use 2 puffs twice daily for 4-6 days, then PRN q6h cough.       No results found for this or any previous visit (from the past 24 hour(s)). No results found.   ROS: Negative, with the exception of above mentioned in HPI   Objective:  BP 117/78 (BP Location: Left Arm, Patient Position: Sitting, Cuff Size: Normal)   Pulse (!) 103   Temp 99 F (37.2 C)   Resp 20   Wt 128 lb (58.1 kg)   SpO2 99%   BMI 20.66 kg/m  Body mass index is 20.66 kg/m. Gen: Afebrile. No acute distress. Nontoxic in appearance, well developed, well nourished.  HENT: AT. [place]. Bilateral TM visualized Air-fluid levels bilaterally.. MMM, no oral lesions. Bilateral nares with erythema and drainage. Throat without erythema or exudates. Cough present, hoarseness present, tender to palpation sinus. Eyes:Pupils Equal Round Reactive to light, Extraocular movements intact,  Conjunctiva without redness, discharge or  icterus. Neck/lymp/endocrine: Supple, no lymphadenopathy CV: RRR  Chest: CTAB, no wheeze or crackles. Good air movement, normal resp effort.  Abd: Soft. NTND. BS present.  Skin: No rashes, purpura or petechiae.  Neuro:  Normal gait. PERLA. EOMi. Alert. Oriented x3  Results for orders placed or performed in visit on 07/01/16 (from the past 24 hour(s))  POC Influenza A&B (Binax test)     Status: Abnormal   Collection Time: 07/01/16 10:16 AM  Result Value Ref Range   Influenza A, POC Negative Negative   Influenza B, POC Positive (A) Negative    Assessment/Plan: Alyssa Le is a 31 y.o. female present for acute OV for  Acute maxillary sinusitis, recurrence not specified Inlfuenza B positive,. - Patient is influenza positive today, she is outside the window of treatment. Discussed with her having her other children closely watched, especially her baby. She does have signs of the sinus infection, and we will treat this with amoxicillin 500 mg 3 times a day. She is allergic to an additive ingredient in the Augmentin, and has tolerated amoxicillin in the past. - amoxicillin (AMOXIL) 500 MG capsule; Take 1 capsule (500 mg total) by mouth 3 (three) times daily.  Dispense: 30 capsule; Refill: 0 - Rest, hydrate, Flonase if needed. - Follow-up in 1-2 weeks if not improving.   electronically signed by:  Felix Pacini, DO  [facility] - [place]

## 2016-07-15 DIAGNOSIS — R319 Hematuria, unspecified: Secondary | ICD-10-CM | POA: Diagnosis not present

## 2016-07-15 DIAGNOSIS — Z6820 Body mass index (BMI) 20.0-20.9, adult: Secondary | ICD-10-CM | POA: Diagnosis not present

## 2016-07-15 DIAGNOSIS — Z130 Encounter for screening for diseases of the blood and blood-forming organs and certain disorders involving the immune mechanism: Secondary | ICD-10-CM | POA: Diagnosis not present

## 2016-07-15 DIAGNOSIS — Z01419 Encounter for gynecological examination (general) (routine) without abnormal findings: Secondary | ICD-10-CM | POA: Diagnosis not present

## 2016-07-15 DIAGNOSIS — Z309 Encounter for contraceptive management, unspecified: Secondary | ICD-10-CM | POA: Diagnosis not present

## 2016-07-15 DIAGNOSIS — Z1389 Encounter for screening for other disorder: Secondary | ICD-10-CM | POA: Diagnosis not present

## 2016-09-01 ENCOUNTER — Ambulatory Visit (INDEPENDENT_AMBULATORY_CARE_PROVIDER_SITE_OTHER): Payer: BLUE CROSS/BLUE SHIELD | Admitting: Family Medicine

## 2016-09-01 ENCOUNTER — Ambulatory Visit (INDEPENDENT_AMBULATORY_CARE_PROVIDER_SITE_OTHER)
Admission: RE | Admit: 2016-09-01 | Discharge: 2016-09-01 | Disposition: A | Discharge status: Home / Self Care | Payer: BLUE CROSS/BLUE SHIELD | Source: Ambulatory Visit | Attending: Family Medicine | Admitting: Family Medicine

## 2016-09-01 ENCOUNTER — Encounter: Payer: Self-pay | Admitting: Family Medicine

## 2016-09-01 ENCOUNTER — Telehealth: Payer: Self-pay | Admitting: Family Medicine

## 2016-09-01 VITALS — BP 110/72 | HR 85 | Temp 98.3°F | Wt 130.6 lb

## 2016-09-01 DIAGNOSIS — S92512A Displaced fracture of proximal phalanx of left lesser toe(s), initial encounter for closed fracture: Secondary | ICD-10-CM | POA: Diagnosis not present

## 2016-09-01 DIAGNOSIS — S99922A Unspecified injury of left foot, initial encounter: Secondary | ICD-10-CM | POA: Diagnosis not present

## 2016-09-01 NOTE — Telephone Encounter (Signed)
Patient Name: Alyssa Le DOB: [birth year] Initial Comment Caller states, she possibly broke her toe on her left foot. Nurse Assessment Nurse: Anner Crete, RN, Olegario Messier Date/Time (Eastern Time): 09/01/2016 8:49:53 AM Confirm and document reason for call. If symptomatic, describe symptoms. ---Caller states she caught her toe and thinks it may be broken . It is crooked and her pain score is a 5/10 at present. It is the 4th toe on her left foot. Does the patient have any new or worsening symptoms? ---Yes Will a triage be completed? ---Yes Related visit to physician within the last 2 weeks? ---No Does the PT have any chronic conditions? (i.e. diabetes, asthma, etc.) ---Yes List chronic conditions. ---Asthma Is the patient pregnant or possibly pregnant? (Ask all females between the ages of 7-55) ---No Is this a behavioral health or substance abuse call? ---No Guidelines Guideline Title Affirmed Question Affirmed Notes Toe Injury Looks like a broken bone (e.g., crooked or deformed) Final Disposition User See Physician within 4 Hours (or PCP triage) Anner Crete, RN, Olegario Messier Comments NO appt available with PCP. Appt scheduled with Jodi Marble, NP at 10am. Referrals REFERRED TO PCP OFFICE Disagree/Comply: Comply

## 2016-09-01 NOTE — Patient Instructions (Addendum)
Please go to [facility] - located [address] across the street from [place] - in the basement - Hours: 8:30-5:30 PM M-F. Do not need appointment.   Follow up in one week or sooner if needed    How to Buddy Tape Buddy taping refers to taping an injured finger or toe to an uninjured finger or toe that is next to it. This protects the injured finger or toe and keeps it from moving while the injury heals. You may buddy tape a finger or toe if you have a minor sprain. Your health care provider may buddy tape your finger or toe if you have a sprain, dislocation, or fracture. You may be told to replace your buddy taping as needed. What are the risks? Generally, buddy taping is safe. However, problems may occur, such as:  Skin injury or infection.  Reduced blood flow to the finger or toe.  Skin reaction to the tape. Do not buddy tape your toe if you have diabetes. Do not buddy tape if you know that you have an allergy to adhesives or surgical tape. How to buddy tape Before Buddy Taping  Try to reduce any pain and swelling with rest, icing, and elevation:  Avoid any activity that causes pain.  Raise (elevate) your hand or foot above the level of your heart while you are sitting or lying down.  If directed, apply ice to the injured area:  Put ice in a plastic bag.  Place a towel between your skin and the bag.  Leave the ice on for 20 minutes, 2-3 times per day. Buddy Taping Procedure   Clean and dry your finger or toe as told by your health care provider.  Place a gauze pad or a piece of cloth or cotton between your injured finger or toe and the uninjured finger or toe.  Use tape to wrap around both fingers or toes so your injured finger or toe is secured to the uninjured finger or toe.  The tape should be snug, but not tight.  Make sure the ends of the piece of tape overlap.  Avoid placing tape directly over the joint.  Change the tape and the padding as told  by your health care provider. Remove and replace the tape or padding if it becomes loose, worn, dirty, or wet. After Buddy Taping   Take over-the-counter and prescription medicines only as told by your health care provider.  Return to your normal activities as told by your health care provider. Ask your health care provider what activities are safe for you.  Watch the buddy-taped area and always remove buddy taping if:  Your pain gets worse.  Your fingers turn pale or blue.  Your skin becomes irritated. Contact a health care provider if:  You have pain, swelling, or bruising that lasts longer than three days.  You have a fever.  Your skin is red, cracked, or irritated. Get help right away if:  The injured area becomes cold, numb, or pale.  You have severe pain, swelling, bruising, or loss of movement in your finger or toe.  Your finger or toe changes shape (deformity). This information is not intended to replace advice given to you by your health care provider. Make sure you discuss any questions you have with your health care provider. Document Released: 06/30/2004 Document Revised: 10/29/2015 Document Reviewed: 10/15/2014 Elsevier Interactive Patient Education  2017 Elsevier Inc.  WE NOW OFFER   [facility] FAST TRACK!!!  SAME DAY  Appointments for ACUTE CARE  Such as: Sprains, Injuries, cuts, abrasions, rashes, muscle pain, joint pain, back pain Colds, flu, sore throats, headache, allergies, cough, fever  Ear pain, sinus and eye infections Abdominal pain, nausea, vomiting, diarrhea, upset stomach Animal/insect bites  3 Easy Ways to Schedule: Walk-In Scheduling Call in scheduling Mychart Sign-up: [email or web address]

## 2016-09-01 NOTE — Progress Notes (Signed)
Pre visit review using our clinic review tool, if applicable. No additional management support is needed unless otherwise documented below in the visit note. 

## 2016-09-01 NOTE — Progress Notes (Signed)
Subjective:    Patient ID: [name], female    DOB: Jul 21, 1985, 31 y.o.   MRN: [id number]  HPI  Ms. Depass is a 31 year old female who presents today with left 4th toe injury that occurred this morning.  She has buddy taped three toes and is wearing a birkenstock style shoe. She reports catching the toe on a door after running to check on her child.  Pain is reported as a 5 and described as aching and burning.  She denies fever, chills, sweats, edema, laceration, numbness, tingling, or weakness. Associated ecchymosis, pain with ambulation, and inability to wear close toed shoe.  Treatment with ice with 20 minutes on and 20 minutes off and buddy taping have provided moderate benefit. No history of DM  Review of Systems  Constitutional: Negative for chills.  Respiratory: Negative for cough, shortness of breath and wheezing.   Cardiovascular: Negative for chest pain and palpitations.  Gastrointestinal: Negative for abdominal pain, nausea and vomiting.  Musculoskeletal:       Left toe injury No swelling of 4th digit  Skin: Negative for rash.   Past Medical History:  Diagnosis Date  . Allergy    multiple food & environmental allergies (tree pollens & fruits)  . Arthritis    exercise & allergy induced  . Asthma    inhaler last used 2015  . Headache   . Ovarian cyst, right    surgically removed. 31 y.o.     Social History   Social History  . Marital status: Married    Spouse name: N/A  . Number of children: 2  . Years of education: N/A   Occupational History  . 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 Smoking status: Never Smoker  . Smokeless tobacco: Never Used  . Alcohol use No  . Drug use: No  . Sexual activity: Yes     Comment: preg   Other Topics Concern  . Not on file   Social History Narrative   Ms. Fehnel is from [place]. She lives with her husband, 2 small children, and mother-in-law.   She  works part-time.    Past Surgical History:  Procedure Laterality Date  . APPENDECTOMY    . 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 Adopted: Yes  . Alcohol abuse Neg Hx   . Arthritis Neg Hx   . Asthma Neg Hx   . Birth defects Neg Hx   . Cancer Neg Hx   . COPD Neg Hx   . Depression Neg Hx   . Diabetes Neg Hx   . Drug abuse Neg Hx   . Early death Neg Hx   . Hearing loss Neg Hx   . Heart disease Neg Hx   . Hyperlipidemia Neg Hx   . Hypertension Neg Hx   . Kidney disease Neg Hx   . Learning disabilities Neg Hx   . Mental illness Neg Hx   . Mental retardation Neg Hx   . Miscarriages / Stillbirths Neg Hx   . Stroke Neg Hx   . Vision loss Neg Hx   . Varicose Veins Neg Hx     Allergies  Allergen Reactions  . Amoxicillin-Pot Clavulanate Shortness Of Breath and Other (See Comments)    Reaction:  Chest pain  -  pt cannot tolerate Augmentin - but can take Amoxicillin  . Other Anaphylaxis and Other (See Comments)    Pt states that she is allergic to fresh fruit and tree nuts.    . Cephalexin Rash  . Latex Rash  . Perineal Cleansing [Balneol] Rash    Current Outpatient Prescriptions on File Prior to Visit  Medication Sig Dispense Refill  . albuterol (PROVENTIL HFA;VENTOLIN HFA) 108 (90 BASE) MCG/ACT inhaler Use 2 puffs twice daily for 4-6 days, then PRN q6h cough. (Patient taking differently: Inhale 2 puffs into the lungs every 4 (four) hours as needed for shortness of breath. ) 1 Inhaler 0   No current facility-administered medications on file prior to visit.     BP 110/72 (BP Location: Left Arm, Patient Position: Sitting, Cuff Size: Normal)   Pulse 85   Temp 98.3 F (36.8 C) (Oral)   Wt 130 lb 9.6 oz (59.2 kg)   SpO2 98%   BMI 21.08 kg/m        Objective:   Physical Exam  Constitutional: She is oriented to person, place, and time. She appears  well-developed and well-nourished.  Eyes: Pupils are equal, round, and reactive to light. No scleral icterus.  Cardiovascular: Normal rate, regular rhythm and intact distal pulses.   Pulmonary/Chest: Effort normal and breath sounds normal. She has no wheezes. She has no rales.  Musculoskeletal:  Left 4th digit with immediate capillary refill; sensation present; mild abduction of 4th digit, no edema present. No laceration noted; nailbed in place. Pain is noted with palpation  Neurological: She is alert and oriented to person, place, and time.  Skin: Skin is warm and dry. No rash noted.        Assessment & Plan:  1. Injury of toe on left foot, initial encounter Injury to left toe; suspect possible fracture of 4th toe. Advised warning signs of pain getting worse or area turing pale, blue, or any irritation of skin is noted. Buddy taped toe and will send for imaging due to presentation of abduction. She declined prescription for surgical shoe so I advised her to use rigid, open toe shoe which she was wearing. We discussed use of ice and elevation. She declined ibuprofen option stating this has caused her GI upset.  Follow up in one week or sooner if needed. Imaging will determine further treatment. - DG Toe 4th Left; Future  Roddie Mc, FNP-C

## 2016-09-01 NOTE — Telephone Encounter (Signed)
Patient is seeing Alyssa Le this morning  at 10 am.

## 2016-09-05 ENCOUNTER — Telehealth: Payer: Self-pay | Admitting: Family Medicine

## 2016-09-05 NOTE — Telephone Encounter (Signed)
° ° °  Pt would like a call back about her xray results. Said she saw Amil Amen last Thursday

## 2016-09-06 ENCOUNTER — Other Ambulatory Visit: Payer: Self-pay

## 2016-09-06 DIAGNOSIS — S92502A Displaced unspecified fracture of left lesser toe(s), initial encounter for closed fracture: Secondary | ICD-10-CM

## 2016-09-06 NOTE — Telephone Encounter (Signed)
Patient is aware of her lab result, spoke with patient yesterday.

## 2016-09-07 ENCOUNTER — Ambulatory Visit: Payer: Self-pay

## 2016-09-07 ENCOUNTER — Encounter: Payer: Self-pay | Admitting: Sports Medicine

## 2016-09-07 ENCOUNTER — Ambulatory Visit (INDEPENDENT_AMBULATORY_CARE_PROVIDER_SITE_OTHER): Payer: BLUE CROSS/BLUE SHIELD | Admitting: Sports Medicine

## 2016-09-07 VITALS — BP 108/72 | HR 83 | Ht 66.0 in | Wt 131.8 lb

## 2016-09-07 DIAGNOSIS — S92512A Displaced fracture of proximal phalanx of left lesser toe(s), initial encounter for closed fracture: Secondary | ICD-10-CM

## 2016-09-07 NOTE — Progress Notes (Signed)
OFFICE VISIT NOTE Alyssa Le. [name] Sports Medicine [facility] [phone]  Alyssa Le - 31 y.o. female MRN [id number]  Date of birth: [birth year]  Visit Date: 09/07/2016  PCP: Natalia Leatherwood, DO   Referred by: Natalia Leatherwood, DO  SUBJECTIVE:   Chief Complaint  Patient presents with  . toe pain, left foot    injured 4th toe left foot last Thursday while chasing her child. Toe got caught on steel door. Pain is present when bearing weight. Pain is worse at night, throbs and feels like pins and needles. Pain is about a 2 when bearing weight. Pain at night is about a 7. She cannot take Ibuprofen so she has been unable to take meds for the pain. She has also iced the toe with some relief.    HPI: As above. Additional pertinent information includes:  Injury as above.  Pain is rated as moderate. Denies fevers, chills, recent weight gain or weight loss.  No night sweats. No significant nighttime awakenings due to this issue.   ROS  Otherwise per HPI.  HISTORY & PERTINENT PRIOR DATA:  No specialty comments available. She reports that she has never smoked. She has never used smokeless tobacco. No results for input(s): HGBA1C, LABURIC in the last 8760 hours. Medications & Allergies reviewed per EMR Patient Active Problem List   Diagnosis Date Noted  . Closed displaced fracture of proximal phalanx of lesser toe of left foot 09/13/2016  . UTI (urinary tract infection) 10/16/2015  . Diverticulosis of large intestine without hemorrhage 10/31/2014  . Periumbilical pain 10/31/2014  . Diverticular disease of right side of colon 09/24/2014  . Indigestion 09/24/2014  . Periumbilical abdominal pain 09/19/2014  . Lactating mother 09/19/2014  . Abdominal pain, epigastric 08/19/2014  . History of asthma 06/15/2013  . Multiple allergies 06/15/2013  . Preventative health care 06/14/2013  . Acquired arm deformity 06/14/2013   Past Medical History:    Diagnosis Date  . Allergy    multiple food & environmental allergies (tree pollens & fruits)  . Arthritis    exercise & allergy induced  . Asthma    inhaler last used 2015  . Headache   . Ovarian cyst, right    surgically removed. 31 y.o.   Family History  Problem Relation Age of Onset  . Adopted: Yes  . Alcohol abuse Neg Hx   . Arthritis Neg Hx   . Asthma Neg Hx   . Birth defects Neg Hx   . Cancer Neg Hx   . COPD Neg Hx   . Depression Neg Hx   . Diabetes Neg Hx   . Drug abuse Neg Hx   . Early death Neg Hx   . Hearing loss Neg Hx   . Heart disease Neg Hx   . Hyperlipidemia Neg Hx   . Hypertension Neg Hx   . Kidney disease Neg Hx   . Learning disabilities Neg Hx   . Mental illness Neg Hx   . Mental retardation Neg Hx   . Miscarriages / Stillbirths Neg Hx   . Stroke Neg Hx   . Vision loss Neg Hx   . Varicose Veins Neg Hx    Past Surgical History:  Procedure Laterality Date  . APPENDECTOMY    . 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 Smoking status: Never Smoker  . Smokeless tobacco: Never Used  . Alcohol use No  . Drug use: No  . Sexual activity: Yes     Comment: preg    OBJECTIVE:  VS:  HT:5\' 6"  (167.6 cm)   WT:131 lb 12.8 oz (59.8 kg)  BMI:21.3    BP:108/72  HR:83bpm  TEMP: ( )  RESP:98 % Physical Exam  WDWN, NAD, Non-toxic appearing Alert & appropriately interactive Not depressed or anxious appearing No increased work of breathing. Pupils are equal. EOM intact without nystagmus No clubbing or cyanosis of the extremities appreciated No significant rashes/lesions/ulcerations overlying the examined area. DP & PT pulses 2+/4.  No significant pretibial edema. Sensation intact to light touch in lower extremities. Left foot: Moderate amount of bruising  over the lateral column of the left foot.  She has pain with direct palpation of the fourth phalanx.  Good capillary refill.  No significant skin lesions.  IMAGING & PROCEDURES: Dg Toe 4th Left  Result Date: 09/01/2016 CLINICAL DATA:  Left fourth toe injury with pain, initial encounter EXAM: LEFT FOURTH TOE COMPARISON:  None. FINDINGS: There is an oblique fracture through the fourth proximal phalanx with only mild displacement. No other fractures are noted. No soft tissue abnormality is seen. IMPRESSION: Fourth proximal phalangeal fracture. Electronically Signed   By: Alcide Clever M.D.   On: 09/01/2016 14:09   No additional findings.   ASSESSMENT & PLAN:  Visit Diagnoses:  1. Closed displaced fracture of proximal phalanx of lesser toe of left foot, initial encounter    Meds: No orders of the defined types were placed in this encounter.   Orders: n/a   Follow-up: Return in about 1 week (around 09/14/2016).   Otherwise please see problem oriented charting as below.

## 2016-09-08 ENCOUNTER — Telehealth: Payer: Self-pay | Admitting: Sports Medicine

## 2016-09-08 NOTE — Telephone Encounter (Signed)
Called patient and discussed her concerns with her level of pain last evening and how to manage it. Patient stated that pain was better today and improving throughout the day today. Advised her on methods of care to alleviate swelling and precautionary methods to take when "mobile" around house. Stated to patient that if pain does not improve or reaches same level as last evening to call back and get on schedule tomorrow to be seen by Dr. Berline Chough. Patient verbalized understanding of entire conversation.

## 2016-09-08 NOTE — Telephone Encounter (Signed)
Patient has questions about foot pain she experienced last night. Patient's foot was red and in a lot of pain last night and she is unsure if it is due to how it was tapped or something more. The pain level last night was worse than when patient first injured foot. Please call patient back to advise. Okay to leave a detailed message on patient's phone.

## 2016-09-13 ENCOUNTER — Encounter: Payer: Self-pay | Admitting: Sports Medicine

## 2016-09-13 ENCOUNTER — Ambulatory Visit (INDEPENDENT_AMBULATORY_CARE_PROVIDER_SITE_OTHER): Payer: BLUE CROSS/BLUE SHIELD

## 2016-09-13 ENCOUNTER — Ambulatory Visit (INDEPENDENT_AMBULATORY_CARE_PROVIDER_SITE_OTHER): Payer: BLUE CROSS/BLUE SHIELD | Admitting: Sports Medicine

## 2016-09-13 VITALS — BP 104/64 | HR 80 | Ht 66.0 in | Wt 130.2 lb

## 2016-09-13 DIAGNOSIS — S92512A Displaced fracture of proximal phalanx of left lesser toe(s), initial encounter for closed fracture: Secondary | ICD-10-CM | POA: Diagnosis not present

## 2016-09-13 DIAGNOSIS — S92342A Displaced fracture of fourth metatarsal bone, left foot, initial encounter for closed fracture: Secondary | ICD-10-CM | POA: Diagnosis not present

## 2016-09-13 HISTORY — DX: Displaced fracture of proximal phalanx of left lesser toe(s), initial encounter for closed fracture: S92.512A

## 2016-09-13 NOTE — Assessment & Plan Note (Signed)
Continue with buddy taping.  Cast padding was provided as a cloth barrier between the buddy taping and her skin is the adhesive from the [place] is causing some mild irritation.  Otherwise continue with protection and buddy taping and follow-up in 3 weeks for repeat x-rays and exam.

## 2016-09-13 NOTE — Progress Notes (Signed)
OFFICE VISIT NOTE Alyssa Le. Alyssa Le Sports Medicine [facility] [phone]  Alyssa Le - 31 y.o. female MRN [id number]  Date of birth: 31-Oct-1985  Visit Date: 09/13/2016  PCP: Felix Pacini, DO   Referred by: Natalia Leatherwood, DO  SUBJECTIVE:   Chief Complaint  Patient presents with  . Follow-up    4th toe left foot. Swelling has gone down. Still having some discomfort, aching, worse at night. She has noticed that at times the left foot is darker than the right foot.    HPI: As above. Additional pertinent information includes:  Pain overall is better.  She continues to Buddy tape but is having some skin irritation due to this.  Some generalized redness and darkening of the entire left foot.   ROS: ROS  Otherwise per HPI.  HISTORY & PERTINENT PRIOR DATA:  No specialty comments available. She reports that she has never smoked. She has never used smokeless tobacco. No results for input(s): HGBA1C, LABURIC in the last 8760 hours. Medications & Allergies reviewed per EMR Patient Active Problem List   Diagnosis Date Noted  . Closed displaced fracture of proximal phalanx of lesser toe of left foot 09/13/2016  . UTI (urinary tract infection) 10/16/2015  . Diverticulosis of large intestine without hemorrhage 10/31/2014  . Periumbilical pain 10/31/2014  . Diverticular disease of right side of colon 09/24/2014  . Indigestion 09/24/2014  . Periumbilical abdominal pain 09/19/2014  . Lactating mother 09/19/2014  . Abdominal pain, epigastric 08/19/2014  . History of asthma 06/15/2013  . Multiple allergies 06/15/2013  . Preventative health care 06/14/2013  . Acquired arm deformity 06/14/2013   Past Medical History:  Diagnosis Date  . Allergy    multiple food & environmental allergies (tree pollens & fruits)  . Arthritis    exercise & allergy induced  . Asthma    inhaler last used 2015  . Headache   . Ovarian cyst, right    surgically  removed. 31 y.o.   Family History  Problem Relation Age of Onset  . Adopted: Yes  . Alcohol abuse Neg Hx   . Arthritis Neg Hx   . Asthma Neg Hx   . Birth defects Neg Hx   . Cancer Neg Hx   . COPD Neg Hx   . Depression Neg Hx   . Diabetes Neg Hx   . Drug abuse Neg Hx   . Early death Neg Hx   . Hearing loss Neg Hx   . Heart disease Neg Hx   . Hyperlipidemia Neg Hx   . Hypertension Neg Hx   . Kidney disease Neg Hx   . Learning disabilities Neg Hx   . Mental illness Neg Hx   . Mental retardation Neg Hx   . Miscarriages / Stillbirths Neg Hx   . Stroke Neg Hx   . Vision loss Neg Hx   . Varicose Veins Neg Hx    Past Surgical History:  Procedure Laterality Date  . APPENDECTOMY    . 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 Smoking status: Never Smoker  . Smokeless tobacco: Never Used  . Alcohol use No  . Drug use: No  . Sexual activity: Yes     Comment: preg  OBJECTIVE:  VS:  HT:5\' 6"  (167.6 cm)   WT:130 lb 3.2 oz (59.1 kg)  BMI:21.1    BP:104/64  HR:80bpm  TEMP: ( )  RESP:99 % Physical Exam  Constitutional: She appears well-developed and well-nourished. She is cooperative.  Non-toxic appearance.  HENT:  Head: Normocephalic and atraumatic.  Cardiovascular: Intact distal pulses.   Pulmonary/Chest: No accessory muscle usage. No respiratory distress.  Neurological: She is alert. She is not disoriented. No sensory deficit.  Skin: Skin is warm, dry and intact. Capillary refill takes less than 2 seconds. No abrasion and no rash noted.  Psychiatric: She has a normal mood and affect. Her speech is normal and behavior is normal. Thought content normal.   Left foot: Slight lateral angulation of the fourth toe but this is less than 5.  She has flexion extension mechanism  that is intact.  Generalized pain over the proximal phalanx.  Good capillary refill.  No significant skin breakdown but a small amount of erythema and darkening of the entire distal aspect of the foot.  IMAGING & PROCEDURES: Dg Toe 4th Left  Result Date: 09/13/2016 CLINICAL DATA:  Follow-up left fourth toe fracture. Symptoms are improving. EXAM: LEFT FOURTH TOE COMPARISON:  Left fourth toe radiographs of September 01, 2016 FINDINGS: Again demonstrated is a mildly displaced spiral fracture of the mid and distal shaft of the fourth proximal phalanx. There is mild angulation at the fracture site which appears stable. No periosteal reaction is observed. IMPRESSION: No evidence of bony bridging of the spiral fracture of the shaft of the proximal phalanx of the fourth toe. Mild angulation and displacement persists. Electronically Signed   By: David  [place] M.D.   On: 09/13/2016 10:57   Dg Toe 4th Left  Result Date: 09/01/2016 CLINICAL DATA:  Left fourth toe injury with pain, initial encounter EXAM: LEFT FOURTH TOE COMPARISON:  None. FINDINGS: There is an oblique fracture through the fourth proximal phalanx with only mild displacement. No other fractures are noted. No soft tissue abnormality is seen. IMPRESSION: Fourth proximal phalangeal fracture. Electronically Signed   By: Alcide Clever M.D.   On: 09/01/2016 14:09   Findings:  X-rays today are essentially unchanged without significant callus formation.    ASSESSMENT & PLAN:  Visit Diagnoses:  1. Closed displaced fracture of proximal phalanx of lesser toe of left foot, initial encounter    Meds: No orders of the defined types were placed in this encounter.   Orders:  Orders Placed This Encounter  Procedures  . DG Toe 4th Left    Follow-up: Return in about 3 weeks (around 10/03/2016).   Otherwise please see problem oriented charting as below.

## 2016-10-04 ENCOUNTER — Ambulatory Visit: Payer: BLUE CROSS/BLUE SHIELD | Admitting: Sports Medicine

## 2016-10-04 ENCOUNTER — Telehealth: Payer: Self-pay | Admitting: Sports Medicine

## 2016-10-04 NOTE — Telephone Encounter (Signed)
Okay to reschedule if she calls in

## 2016-10-04 NOTE — Telephone Encounter (Signed)
Okay to schedule if she calls in.

## 2016-10-04 NOTE — Telephone Encounter (Signed)
First no show, no charge. Want ok before reschedule.

## 2016-10-10 NOTE — Assessment & Plan Note (Signed)
Slight displacement today but minimal.  Will recheck in 1 week with repeat x-rays. Buddy taping striated today involving the third fourth and fifth toe. Plan provided on avoidance of exacerbating injury.Marland Kitchen

## 2016-10-15 DIAGNOSIS — H60391 Other infective otitis externa, right ear: Secondary | ICD-10-CM | POA: Diagnosis not present

## 2016-10-18 ENCOUNTER — Telehealth: Payer: Self-pay

## 2016-10-18 NOTE — Telephone Encounter (Signed)
Patient notified and verbalized understanding. Patient will try OTC first and call back if no relief.

## 2016-10-18 NOTE — Telephone Encounter (Signed)
Patient called and left voice mail, requesting medication to be called in for a yeast infection. [facility] and was [facility] on an antibiotic and now has a yeast infection.

## 2016-10-18 NOTE — Telephone Encounter (Signed)
Please encourage patient to take over-the-counter preparation for yeast infection. Such as Monistat.  Oral/prescribed medication is usually reserved for people who fail over-the-counter therapy. If she continues to have symptoms after using over-the-counter, I would encourage her to make an appointment so we can evaluate, treat and prescribe her medication.

## 2016-11-21 ENCOUNTER — Telehealth: Payer: Self-pay | Admitting: Family Medicine

## 2016-11-21 ENCOUNTER — Encounter: Payer: Self-pay | Admitting: Family Medicine

## 2016-11-21 ENCOUNTER — Ambulatory Visit (INDEPENDENT_AMBULATORY_CARE_PROVIDER_SITE_OTHER): Payer: BLUE CROSS/BLUE SHIELD | Admitting: Family Medicine

## 2016-11-21 VITALS — BP 102/60 | HR 70 | Temp 98.9°F | Resp 20 | Wt 127.5 lb

## 2016-11-21 DIAGNOSIS — R319 Hematuria, unspecified: Secondary | ICD-10-CM | POA: Diagnosis not present

## 2016-11-21 DIAGNOSIS — N390 Urinary tract infection, site not specified: Secondary | ICD-10-CM

## 2016-11-21 DIAGNOSIS — R350 Frequency of micturition: Secondary | ICD-10-CM

## 2016-11-21 LAB — POC URINALSYSI DIPSTICK (AUTOMATED)
Glucose, UA: NEGATIVE
Ketones, UA: NEGATIVE
Nitrite, UA: POSITIVE
Urobilinogen, UA: 1 E.U./dL
pH, UA: 6 (ref 5.0–8.0)

## 2016-11-21 MED ORDER — SULFAMETHOXAZOLE-TRIMETHOPRIM 800-160 MG PO TABS: 1.0000 | ORAL_TABLET | Freq: Two times a day (BID) | ORAL | 0 refills | Status: DC

## 2016-11-21 MED ORDER — PHENAZOPYRIDINE HCL 100 MG PO TABS: 100.0000 mg | ORAL_TABLET | Freq: Three times a day (TID) | ORAL | 0 refills | Status: DC | PRN

## 2016-11-21 NOTE — Telephone Encounter (Signed)
Patient seen this am

## 2016-11-21 NOTE — Patient Instructions (Signed)
Flush with water, cranberry.  Pyridium and bactrim prescribed.  Rest and hydrate  I will call with culture results.

## 2016-11-21 NOTE — Telephone Encounter (Signed)
[facility] Night - Client TELEPHONE ADVICE RECORD [facility]  Patient Name: Alyssa Le  DOB: 1985/11/23    Initial Comment Caller states when does office open? has UTI, wants appt;    Nurse Assessment  Nurse: Laural Benes, RN, Dondra Spry Date/Time (Eastern Time): 11/21/2016 7:33:41 AM  Confirm and document reason for call. If symptomatic, describe symptoms. ---Chrystle has urgency frequency and burning with odor to urine. notes also has blood in urine. bladder spasms yesterday onset yesterday  Does the patient have any new or worsening symptoms? ---Yes  Will a triage be completed? ---Yes  Related visit to physician within the last 2 weeks? ---No  Does the PT have any chronic conditions? (i.e. diabetes, asthma, etc.) ---No  Is the patient pregnant or possibly pregnant? (Ask all females between the ages of 45-55) ---No  Is this a behavioral health or substance abuse call? ---No     Guidelines    Guideline Title Affirmed Question Affirmed Notes  Urination Pain - Female [1] SEVERE pain with urination (e.g., excruciating) AND [2] not improved after 2 hours of pain medicine and Sitz bath    Final Disposition User   See Physician within 4 Hours (or PCP triage) Laural Benes, RN, Dondra Spry    Comments  NOTE; 11/21/2016 8am appointment with Dr. Felix Pacini s/sx of UTI with blood in urine   Referrals  REFERRED TO PCP OFFICE   Disagree/Comply: Comply

## 2016-11-21 NOTE — Progress Notes (Signed)
Alyssa Le , 29-Feb-1986, 31 y.o., female MRN: [id number] Patient Care Team    Relationship Specialty Notifications Start End  Natalia Leatherwood, DO PCP - General Family Medicine  04/27/16     Chief Complaint  Patient presents with  . Urinary Tract Infection    frequenct ,blood in urine     Subjective: Pt presents for an OV with complaints of urinary frequency and dysuria for 2-3 days of  duration.  Associated symptoms include mild fatigue. She has noticed some blood in her urine today. She denies fever, chills, flank pain, vomit.  No flowsheet data found.  Allergies  Allergen Reactions  . Amoxicillin-Pot Clavulanate Shortness Of Breath and Other (See Comments)    Reaction:  Chest pain  - pt cannot tolerate Augmentin - but can take Amoxicillin  . Other Anaphylaxis and Other (See Comments)    Pt states that she is allergic to fresh fruit and tree nuts.    . Ibuprofen Other (See Comments)    Causes ulcers  . Cephalexin Rash  . Latex Rash  . Perineal Cleansing [Balneol] Rash   Social History  Substance Use Topics  . Smoking status: Never Smoker  . Smokeless tobacco: Never Used  . Alcohol use No   Past Medical History:  Diagnosis Date  . Allergy    multiple food & environmental allergies (tree pollens & fruits)  . Arthritis    exercise & allergy induced  . Asthma    inhaler last used 2015  . Headache   . Ovarian cyst, right    surgically removed. 31 y.o.   Past Surgical History:  Procedure Laterality Date  . APPENDECTOMY    . 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 Adopted: Yes  . Alcohol abuse Neg Hx   . Arthritis Neg Hx   . Asthma Neg Hx   . Birth defects Neg Hx   . Cancer Neg Hx   . COPD Neg Hx   . Depression Neg Hx   . Diabetes Neg Hx   . Drug abuse Neg Hx   . Early death Neg Hx   . Hearing loss Neg Hx   . Heart disease Neg Hx   .  Hyperlipidemia Neg Hx   . Hypertension Neg Hx   . Kidney disease Neg Hx   . Learning disabilities Neg Hx   . Mental illness Neg Hx   . Mental retardation Neg Hx   . Miscarriages / Stillbirths Neg Hx   . Stroke Neg Hx   . Vision loss Neg Hx   . Varicose Veins Neg Hx    Allergies as of 11/21/2016      Reactions   Amoxicillin-pot Clavulanate Shortness Of Breath, Other (See Comments)   Reaction:  Chest pain  - pt cannot tolerate Augmentin - but can take Amoxicillin   Other Anaphylaxis, Other (See Comments)   Pt states that she is allergic to fresh fruit and tree nuts.     Ibuprofen Other (See Comments)   Causes ulcers   Cephalexin Rash   Latex Rash   Perineal Cleansing [balneol] Rash      Medication List       Accurate as of 11/21/16  8:14 AM. Always use your most recent med list.          albuterol 108 (90 Base) MCG/ACT inhaler Commonly  known as:  PROVENTIL HFA;VENTOLIN HFA Use 2 puffs twice daily for 4-6 days, then PRN q6h cough.       All past medical history, surgical history, allergies, family history, immunizations andmedications were updated in the EMR today and reviewed under the history and medication portions of their EMR.     ROS: Negative, with the exception of above mentioned in HPI   Objective:  BP 102/60 (BP Location: Left Arm, Patient Position: Sitting, Cuff Size: Normal)   Pulse 70   Temp 98.9 F (37.2 C)   Resp 20   Wt 127 lb 8 oz (57.8 kg)   LMP 10/27/2016   SpO2 100%   BMI 20.58 kg/m  Body mass index is 20.58 kg/m. Gen: Afebrile. No acute distress. Nontoxic in appearance, well developed, well nourished.  HENT: AT. [place].  MMM, no oral lesions.  CV: RRR  Chest: CTAB, no wheeze or crackles.   Abd: Soft. NTND. BS present Neuro:  Normal gait. PERLA. EOMi. Alert. Oriented x3   No exam data present No results found. Results for orders placed or performed in visit on 11/21/16 (from the past 24 hour(s))  POCT Urinalysis Dipstick (Automated)      Status: Abnormal   Collection Time: 11/21/16  8:10 AM  Result Value Ref Range   Color, UA brown    Clarity, UA cloudy    Glucose, UA negative    Bilirubin, UA small    Ketones, UA negative    Spec Grav, UA >=1.030 (A) 1.010 - 1.025   Blood, UA large    pH, UA 6.0 5.0 - 8.0   Protein, UA >=300    Urobilinogen, UA 1.0 0.2 or 1.0 E.U./dL   Nitrite, UA positive    Leukocytes, UA Large (3+) (A) Negative    Assessment/Plan: Alyssa Le is a 31 y.o. female present for OV for  Urinary frequency - POCT Urinalysis Dipstick (Automated) Urinary tract infection with hematuria, site unspecified/UTI - urine sent for culture.  - Pyridium and bactrim prescribed, urine positive for signs of infection.  - rest, hydrate. - F/U PRN   Reviewed expectations re: course of current medical issues.  Discussed self-management of symptoms.  Outlined signs and symptoms indicating need for more acute intervention.  Patient verbalized understanding and all questions were answered.  Patient received an After-Visit Summary.   Note is dictated utilizing voice recognition software. Although note has been proof read prior to signing, occasional typographical errors still can be missed. If any questions arise, please do not hesitate to call for verification.   electronically signed by:  Felix Pacini, DO  [facility] - [place]

## 2016-11-22 LAB — URINE CULTURE: Organism ID, Bacteria: NO GROWTH

## 2016-11-23 ENCOUNTER — Telehealth: Payer: Self-pay | Admitting: Family Medicine

## 2016-11-23 NOTE — Telephone Encounter (Signed)
Please call pt: - Surprisingly her urine culture did not show any signs of infection. I recommend she continue the abx if she is seeing improvement. If she is not seeing improvement then we would need to evaluate further for other causes.

## 2016-11-23 NOTE — Telephone Encounter (Signed)
Spoke with patient reviewed lab results and instructions. Patient states she is seeing improvement with the antibiotic and will complete course as recommended. Patient will call back and schedule an appt. if symptoms do not resolve.

## 2017-01-19 LAB — BASIC METABOLIC PANEL
BUN: 13 (ref 4–21)
Creatinine: 0.7 (ref 0.5–1.1)
GLUCOSE: 77
POTASSIUM: 4.3 (ref 3.4–5.3)
Sodium: 140 (ref 137–147)

## 2017-01-19 LAB — LIPID PANEL
Cholesterol: 168 (ref 0–200)
HDL: 63 (ref 35–70)
LDL Cholesterol: 95
LDl/HDL Ratio: 2.7
Triglycerides: 52 (ref 40–160)

## 2017-01-19 LAB — CBC AND DIFFERENTIAL
HCT: 41 (ref 36–46)
Hemoglobin: 13 (ref 12.0–16.0)
PLATELETS: 222 (ref 150–399)

## 2017-01-19 LAB — HEPATIC FUNCTION PANEL
ALK PHOS: 44 (ref 25–125)
ALT: 15 (ref 7–35)
AST: 17 (ref 13–35)
Bilirubin, Direct: 0.15 (ref 0.01–0.4)
Bilirubin, Total: 0.7

## 2017-01-23 ENCOUNTER — Encounter: Payer: Self-pay | Admitting: *Deleted

## 2017-02-08 ENCOUNTER — Encounter: Payer: Self-pay | Admitting: *Deleted

## 2017-02-15 ENCOUNTER — Encounter: Payer: Self-pay | Admitting: Family Medicine

## 2017-02-15 ENCOUNTER — Ambulatory Visit (HOSPITAL_BASED_OUTPATIENT_CLINIC_OR_DEPARTMENT_OTHER)
Admission: RE | Admit: 2017-02-15 | Discharge: 2017-02-15 | Disposition: A | Discharge status: Home / Self Care | Payer: BLUE CROSS/BLUE SHIELD | Source: Ambulatory Visit | Attending: Family Medicine | Admitting: Family Medicine

## 2017-02-15 ENCOUNTER — Ambulatory Visit (INDEPENDENT_AMBULATORY_CARE_PROVIDER_SITE_OTHER): Payer: BLUE CROSS/BLUE SHIELD | Admitting: Family Medicine

## 2017-02-15 VITALS — BP 108/72 | HR 77 | Temp 98.4°F | Resp 20 | Wt 127.5 lb

## 2017-02-15 DIAGNOSIS — M545 Low back pain, unspecified: Secondary | ICD-10-CM

## 2017-02-15 DIAGNOSIS — R350 Frequency of micturition: Secondary | ICD-10-CM

## 2017-02-15 DIAGNOSIS — R109 Unspecified abdominal pain: Secondary | ICD-10-CM

## 2017-02-15 DIAGNOSIS — N390 Urinary tract infection, site not specified: Secondary | ICD-10-CM | POA: Insufficient documentation

## 2017-02-15 DIAGNOSIS — R319 Hematuria, unspecified: Secondary | ICD-10-CM

## 2017-02-15 LAB — POC URINALSYSI DIPSTICK (AUTOMATED)
GLUCOSE UA: 100
Nitrite, UA: POSITIVE
SPEC GRAV UA: 1.025 (ref 1.010–1.025)
Urobilinogen, UA: 2 E.U./dL — AB
pH, UA: 5.5 (ref 5.0–8.0)

## 2017-02-15 MED ORDER — SULFAMETHOXAZOLE-TRIMETHOPRIM 800-160 MG PO TABS: 1.0000 | ORAL_TABLET | Freq: Two times a day (BID) | ORAL | 0 refills | Status: DC

## 2017-02-15 NOTE — Patient Instructions (Signed)
We will call you when we get all results.  Start the bactrim today. Rest, hydrate. Cranberry.   Get xray to look for any potential stones.    Flank Pain Flank pain is pain in your side. The flank is the area of your side between your upper belly (abdomen) and your back. The pain may occur over a short period of time (acute) or may be long-term or come back often (chronic). It may be mild or very bad. Pain in this area can be caused by many different things. Follow these instructions at home:  Rest as told by your doctor.  Drink enough fluid to keep your pee (urine) clear or pale yellow.  Take over-the-counter and prescription medicines only as told by your doctor.  Keep all follow-up visits as told by your doctor. This is important. Contact a doctor if:  Medicine does not help your pain.  You have new symptoms.  Your pain gets worse.  You have a fever.  Your symptoms last longer than 2-3 days. Get help right away if:  Your tummy hurts or is swollen.  You are short of breath.  You feel sick to your stomach (nauseous) and it does not go away.  You cannot stop throwing up (vomiting).  You feel like you will pass out or you do pass out (faint).  You have blood in your pee.  You have a fever and your symptoms suddenly get worse. This information is not intended to replace advice given to you by your health care provider. Make sure you discuss any questions you have with your health care provider. Document Released: 03/01/2008 Document Revised: 02/12/2016 Document Reviewed: 02/24/2015 Elsevier Interactive Patient Education  2018 [employer].

## 2017-02-15 NOTE — Progress Notes (Signed)
Alyssa Le , April 05, 1986, 31 y.o., female MRN: [id number] Patient Care Team    Relationship Specialty Notifications Start End  Natalia Leatherwood, DO PCP - General Family Medicine  04/27/16     Chief Complaint  Patient presents with  . Urinary Frequency    burning, foul odor     Subjective: Pt presents for an OV with complaints of urinary frequency and suprapubic pain of 1 days. She endorses low back pain that started yesterday, but attributed it to her menstrual cycle. She did have loose stool today, but frequently experiences a day of loose stool when her cycle starts. She has never been diagnosed with endometriosis but has frequent abd pains. She has been diagnosed with diverticulitis in 2016 by CT. She denies fever, chills, nausea, vomit. She reports she had a urology workup years ago and was told she had chronic hematuria. She states when she was pregnant in 2016 she flank pain and she believes she passed a stone, but did not return to doctor to have evaluated.   Depression screen [facility] 2/9 02/15/2017  Decreased Interest 0  Down, Depressed, Hopeless 0  PHQ - 2 Score 0    Allergies  Allergen Reactions  . Amoxicillin-Pot Clavulanate Shortness Of Breath and Other (See Comments)    Reaction:  Chest pain  - pt cannot tolerate Augmentin - but can take Amoxicillin  . Other Anaphylaxis and Other (See Comments)    Pt states that she is allergic to fresh fruit and tree nuts.    . Ibuprofen Other (See Comments)    Causes ulcers  . Cephalexin Rash  . Latex Rash  . Perineal Cleansing [Balneol] Rash   Social History  Substance Use Topics  . Smoking status: Never Smoker  . Smokeless tobacco: Never Used  . Alcohol use No   Past Medical History:  Diagnosis Date  . Allergy    multiple food & environmental allergies (tree pollens & fruits)  . Arthritis    exercise & allergy induced  . Asthma    inhaler last used 2015  . Headache   . Ovarian cyst, right    surgically removed. 31  y.o.   Past Surgical History:  Procedure Laterality Date  . APPENDECTOMY    . 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 Adopted: Yes  . Alcohol abuse Neg Hx   . Arthritis Neg Hx   . Asthma Neg Hx   . Birth defects Neg Hx   . Cancer Neg Hx   . COPD Neg Hx   . Depression Neg Hx   . Diabetes Neg Hx   . Drug abuse Neg Hx   . Early death Neg Hx   . Hearing loss Neg Hx   . Heart disease Neg Hx   . Hyperlipidemia Neg Hx   . Hypertension Neg Hx   . Kidney disease Neg Hx   . Learning disabilities Neg Hx   . Mental illness Neg Hx   . Mental retardation Neg Hx   . Miscarriages / Stillbirths Neg Hx   . Stroke Neg Hx   . Vision loss Neg Hx   . Varicose Veins Neg Hx    Allergies as of 02/15/2017      Reactions   Amoxicillin-pot Clavulanate Shortness Of Breath, Other (See Comments)   Reaction:  Chest pain  - pt cannot  tolerate Augmentin - but can take Amoxicillin   Other Anaphylaxis, Other (See Comments)   Pt states that she is allergic to fresh fruit and tree nuts.     Ibuprofen Other (See Comments)   Causes ulcers   Cephalexin Rash   Latex Rash   Perineal Cleansing [balneol] Rash      Medication List       Accurate as of 02/15/17  2:21 PM. Always use your most recent med list.          albuterol 108 (90 Base) MCG/ACT inhaler Commonly known as:  PROVENTIL HFA;VENTOLIN HFA Use 2 puffs twice daily for 4-6 days, then PRN q6h cough.   phenazopyridine 100 MG tablet Commonly known as:  PYRIDIUM Take 1 tablet (100 mg total) by mouth 3 (three) times daily as needed for pain.   sulfamethoxazole-trimethoprim 800-160 MG tablet Commonly known as:  BACTRIM DS,SEPTRA DS Take 1 tablet by mouth 2 (two) times daily.            Discharge Care Instructions        Start     Ordered   02/15/17 0000  POCT Urinalysis Dipstick (Automated)     02/15/17 1345   02/15/17  0000  Urine Culture     02/15/17 1350   02/15/17 0000  sulfamethoxazole-trimethoprim (BACTRIM DS,SEPTRA DS) 800-160 MG tablet  2 times daily     02/15/17 1408   02/15/17 0000  DG Abd 1 View    Question Answer Comment  Reason for Exam (SYMPTOM  OR DIAGNOSIS REQUIRED) abd pain, flank pain, hematuria   Is patient pregnant? No   Preferred imaging location? [facility]   Radiology Contrast Protocol - do NOT remove file path [email or web address]      02/15/17 1412      All past medical history, surgical history, allergies, family history, immunizations andmedications were updated in the EMR today and reviewed under the history and medication portions of their EMR.     ROS: Negative, with the exception of above mentioned in HPI   Objective:  BP 108/72 (BP Location: Left Arm, Patient Position: Sitting, Cuff Size: Normal)   Pulse 77   Temp 98.4 F (36.9 C)   Resp 20   Wt 127 lb 8 oz (57.8 kg)   SpO2 99%   BMI 20.58 kg/m  Body mass index is 20.58 kg/m. Gen: Afebrile. No acute distress. Nontoxic in appearance, well developed, well nourished.  HENT: AT. [place].  MMM, no oral lesions.  Eyes:Pupils Equal Round Reactive to light, Extraocular movements intact,  Conjunctiva without redness, discharge or icterus. CV: RRR Chest: CTAB, no wheeze or crackles. Good air movement, normal resp effort.  Abd: Soft. Flat.ND. Mild tenderness over suprapubic area.  BS presnet. no Masses palpated. No rebound or guarding.  MSK: No CVA tenderness.  Neuro:  Normal gait. PERLA. EOMi. Alert. Oriented x3   No exam data present No results found. Results for orders placed or performed in visit on 02/15/17 (from the past 24 hour(s))  POCT Urinalysis Dipstick (Automated)     Status: Abnormal   Collection Time: 02/15/17  1:55 PM  Result Value Ref Range   Color, UA amber    Clarity, UA turbid    Glucose, UA 100    Bilirubin, UA small    Ketones, UA trace    Spec  Grav, UA 1.025 1.010 - 1.025   Blood, UA large    pH, UA 5.5 5.0 - 8.0   Protein, UA >=300  Urobilinogen, UA 2.0 (A) 0.2 or 1.0 E.U./dL   Nitrite, UA positive    Leukocytes, UA Small (1+) (A) Negative    Assessment/Plan: Alyssa Le is a 31 y.o. female present for OV for  Urinary frequency Urinary tract infection with hematuria, site unspecified Flank pain Acute bilateral low back pain without sciatica - Urine appears infectious, however difficult to be positive with pyridium use today. Will send for culture and cover with bactrim (sensitivitites from last UTI). If culture is negative again and KUB without stones, would consider CT vs Uro referral. Her prior Uro workup was "many" years ago. Discussed the possibility stones given her prior history of passing stone vs endometriosis causing bladder irritation vs common bladder irritants. - POCT Urinalysis Dipstick (Automated): + leuks, nitrites and large blood. Pt has taken pyridium today.  - Urine Culture Sent.  - DG Abd 1 View; Future - F/U 2 weeks if not resolved.     Reviewed expectations re: course of current medical issues.  Discussed self-management of symptoms.  Outlined signs and symptoms indicating need for more acute intervention.  Patient verbalized understanding and all questions were answered.  Patient received an After-Visit Summary.    Orders Placed This Encounter  Procedures  . Urine Culture  . DG Abd 1 View  . POCT Urinalysis Dipstick (Automated)     Note is dictated utilizing voice recognition software. Although note has been proof read prior to signing, occasional typographical errors still can be missed. If any questions arise, please do not hesitate to call for verification.   electronically signed by:  Felix Pacini, DO  [facility] [place]

## 2017-02-16 ENCOUNTER — Encounter: Payer: Self-pay | Admitting: *Deleted

## 2017-02-16 LAB — URINE CULTURE
MICRO NUMBER: 81008677
Result:: NO GROWTH
SPECIMEN QUALITY:: ADEQUATE

## 2017-02-17 ENCOUNTER — Telehealth: Payer: Self-pay | Admitting: Family Medicine

## 2017-02-17 MED ORDER — PHENAZOPYRIDINE HCL 100 MG PO TABS: 100.0000 mg | ORAL_TABLET | Freq: Three times a day (TID) | ORAL | 0 refills | Status: DC | PRN

## 2017-02-17 NOTE — Telephone Encounter (Signed)
Patient states she was seen in office on this past Wednesday for UTI.  She was prescribed and antibiotic and stated that on Wednesday.  She has not had any relief since starting the antibiotic.  She reports she is still experiencing pain while urinating.    She wants to know if this is normal and what she should do.

## 2017-02-17 NOTE — Telephone Encounter (Signed)
Please call pt: - her urine culture had no growth of bacteria. To get a clearer picture of her condition since she was on her cycle and xray showed large stool burden, I would like her to complete the abx, use the miralax as instructed, and follow up so we can further evaluate and test urine after abx and her menses is complete in 1-2 weeks.

## 2017-02-17 NOTE — Telephone Encounter (Signed)
Spoke with patient reviewed information and instructions. 

## 2017-02-17 NOTE — Telephone Encounter (Signed)
Her culture is normal. Her KUB is normal with the exception of some stool burden.  - if not relief has been provided she can consider following with her GYN for a pelvic examination, follow here next week or if worsening over the weekend go to ED.  I can place the referral to urology for her, however that will take some time to schedule and work her in.  I also called in pyridium for her.  If she improves over the weekend then I still want her to follow up in 1-2 weeks to retest urine.

## 2017-02-17 NOTE — Telephone Encounter (Signed)
Spoke with patient reviewed information and instructions patient verbalized understanding. 

## 2017-02-20 DIAGNOSIS — R319 Hematuria, unspecified: Secondary | ICD-10-CM | POA: Diagnosis not present

## 2017-02-20 DIAGNOSIS — R399 Unspecified symptoms and signs involving the genitourinary system: Secondary | ICD-10-CM | POA: Diagnosis not present

## 2017-02-21 ENCOUNTER — Encounter: Payer: Self-pay | Admitting: *Deleted

## 2017-02-28 ENCOUNTER — Telehealth: Payer: Self-pay | Admitting: *Deleted

## 2017-02-28 NOTE — Telephone Encounter (Signed)
Patient called and left message stating she had been stung by a wasp and its swollen up . She is requesting advise on what she can do. Called and left message for patient if she has any SHOB ,facial swelling,lip or tongue swelling she needs to be seen at [facility] . If only localized swelling she can use OTC antihistamine. If continued swelling at site she can make appt here to be seen.

## 2017-04-07 DIAGNOSIS — N3000 Acute cystitis without hematuria: Secondary | ICD-10-CM | POA: Diagnosis not present

## 2017-04-07 DIAGNOSIS — R310 Gross hematuria: Secondary | ICD-10-CM | POA: Diagnosis not present

## 2017-04-21 DIAGNOSIS — Z368A Encounter for antenatal screening for other genetic defects: Secondary | ICD-10-CM | POA: Diagnosis not present

## 2017-04-21 DIAGNOSIS — N912 Amenorrhea, unspecified: Secondary | ICD-10-CM | POA: Diagnosis not present

## 2017-04-21 DIAGNOSIS — Z3201 Encounter for pregnancy test, result positive: Secondary | ICD-10-CM | POA: Diagnosis not present

## 2017-04-25 ENCOUNTER — Encounter: Payer: Self-pay | Admitting: *Deleted

## 2017-05-18 DIAGNOSIS — O99511 Diseases of the respiratory system complicating pregnancy, first trimester: Secondary | ICD-10-CM | POA: Diagnosis not present

## 2017-05-18 DIAGNOSIS — N393 Stress incontinence (female) (male): Secondary | ICD-10-CM | POA: Insufficient documentation

## 2017-05-18 DIAGNOSIS — O210 Mild hyperemesis gravidarum: Secondary | ICD-10-CM | POA: Insufficient documentation

## 2017-05-18 DIAGNOSIS — Z3A09 9 weeks gestation of pregnancy: Secondary | ICD-10-CM | POA: Diagnosis not present

## 2017-05-18 DIAGNOSIS — Z113 Encounter for screening for infections with a predominantly sexual mode of transmission: Secondary | ICD-10-CM | POA: Diagnosis not present

## 2017-05-18 DIAGNOSIS — O26891 Other specified pregnancy related conditions, first trimester: Secondary | ICD-10-CM | POA: Diagnosis not present

## 2017-05-18 DIAGNOSIS — T7840XD Allergy, unspecified, subsequent encounter: Secondary | ICD-10-CM | POA: Diagnosis not present

## 2017-05-18 DIAGNOSIS — O219 Vomiting of pregnancy, unspecified: Secondary | ICD-10-CM | POA: Diagnosis not present

## 2017-05-18 DIAGNOSIS — Z3689 Encounter for other specified antenatal screening: Secondary | ICD-10-CM | POA: Diagnosis not present

## 2017-05-18 HISTORY — DX: Stress incontinence (female) (male): N39.3

## 2017-05-18 LAB — OB RESULTS CONSOLE RUBELLA ANTIBODY, IGM: RUBELLA: IMMUNE

## 2017-05-18 LAB — OB RESULTS CONSOLE ABO/RH: RH TYPE: POSITIVE

## 2017-05-18 LAB — OB RESULTS CONSOLE GC/CHLAMYDIA
CHLAMYDIA, DNA PROBE: NEGATIVE
Gonorrhea: NEGATIVE

## 2017-05-18 LAB — OB RESULTS CONSOLE RPR: RPR: NONREACTIVE

## 2017-05-18 LAB — OB RESULTS CONSOLE ANTIBODY SCREEN: Antibody Screen: NEGATIVE

## 2017-05-18 LAB — OB RESULTS CONSOLE HEPATITIS B SURFACE ANTIGEN: Hepatitis B Surface Ag: NEGATIVE

## 2017-05-18 LAB — OB RESULTS CONSOLE HIV ANTIBODY (ROUTINE TESTING): HIV: NONREACTIVE

## 2017-06-06 NOTE — L&D Delivery Note (Signed)
Delivery Note Pt complete with urge to push. Epidural working well. She pushed for 15 mins and at 1:41 PM a viable female was delivered via Vaginal, Spontaneous (Presentation:ROA ;  ).  APGAR:9,9, ; weight pending  .   Placenta status:delivered, intact schultz , .  Cord:3vc  with the following complications: none.  Cord pH n/a  Anesthesia:  Epidural Episiotomy: None Lacerations: None Est. Blood Loss (mL): 200  Mom to postpartum.  Baby to Couplet care / Skin to Skin.  Cathrine Muster 12/12/2017, 1:54 PM

## 2017-07-12 DIAGNOSIS — R350 Frequency of micturition: Secondary | ICD-10-CM | POA: Diagnosis not present

## 2017-07-17 DIAGNOSIS — R3121 Asymptomatic microscopic hematuria: Secondary | ICD-10-CM | POA: Diagnosis not present

## 2017-07-17 DIAGNOSIS — R1084 Generalized abdominal pain: Secondary | ICD-10-CM | POA: Diagnosis not present

## 2017-07-24 DIAGNOSIS — Z363 Encounter for antenatal screening for malformations: Secondary | ICD-10-CM | POA: Diagnosis not present

## 2017-07-24 DIAGNOSIS — O4442 Low lying placenta NOS or without hemorrhage, second trimester: Secondary | ICD-10-CM | POA: Diagnosis not present

## 2017-07-24 DIAGNOSIS — Z3A19 19 weeks gestation of pregnancy: Secondary | ICD-10-CM | POA: Diagnosis not present

## 2017-07-26 DIAGNOSIS — Z3A19 19 weeks gestation of pregnancy: Secondary | ICD-10-CM | POA: Diagnosis not present

## 2017-07-26 DIAGNOSIS — O4403 Placenta previa specified as without hemorrhage, third trimester: Secondary | ICD-10-CM | POA: Diagnosis not present

## 2017-09-13 DIAGNOSIS — Z3689 Encounter for other specified antenatal screening: Secondary | ICD-10-CM | POA: Diagnosis not present

## 2017-09-18 ENCOUNTER — Ambulatory Visit: Payer: BLUE CROSS/BLUE SHIELD | Admitting: Family Medicine

## 2017-09-18 ENCOUNTER — Encounter: Payer: Self-pay | Admitting: Family Medicine

## 2017-09-18 VITALS — BP 107/67 | HR 90 | Temp 98.7°F | Resp 20 | Ht 67.0 in | Wt 152.0 lb

## 2017-09-18 DIAGNOSIS — R6889 Other general symptoms and signs: Secondary | ICD-10-CM

## 2017-09-18 DIAGNOSIS — J302 Other seasonal allergic rhinitis: Secondary | ICD-10-CM | POA: Diagnosis not present

## 2017-09-18 MED ORDER — OLOPATADINE HCL 0.1 % OP SOLN: 1.0000 [drp] | Freq: Two times a day (BID) | OPHTHALMIC | 5 refills | Status: DC

## 2017-09-18 NOTE — Progress Notes (Signed)
Alyssa Le , 07-20-1985, 32 y.o., female MRN: [id number] Patient Care Team    Relationship Specialty Notifications Start End  Natalia Leatherwood, DO PCP - General Family Medicine  04/27/16     Chief Complaint  Patient presents with  . Allergic Rhinitis     eyes itchy     Subjective: Pt presents for an OV with complaints of seasonal allergies of a few weeks duration.  Associated symptoms include itchy  watery eyes as her main complaint. She is [redacted] weeks pregnant and attempting to not take any medications. She has rather severe allergies yearly.   Depression screen [facility] 2/9 02/15/2017  Decreased Interest 0  Down, Depressed, Hopeless 0  PHQ - 2 Score 0    Allergies  Allergen Reactions  . Amoxicillin-Pot Clavulanate Shortness Of Breath and Other (See Comments)    Reaction:  Chest pain  - pt cannot tolerate Augmentin - but can take Amoxicillin  . Other Anaphylaxis and Other (See Comments)    Pt states that she is allergic to fresh fruit and tree nuts.    . Ibuprofen Other (See Comments)    Causes ulcers  . Cephalexin Rash  . Latex Rash  . Perineal Cleansing [Balneol] Rash   Social History   Tobacco Use  . Smoking status: Never Smoker  . Smokeless tobacco: Never Used  Substance Use Topics  . Alcohol use: No   Past Medical History:  Diagnosis Date  . Allergy    multiple food & environmental allergies (tree pollens & fruits)  . Arthritis    exercise & allergy induced  . Asthma    inhaler last used 2015  . Headache   . Ovarian cyst, right    surgically removed. 32 y.o.   Past Surgical History:  Procedure Laterality Date  . APPENDECTOMY    . 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 Alcohol abuse Neg Hx   . Arthritis Neg Hx   . Asthma Neg Hx   . Birth defects Neg Hx   . Cancer Neg Hx   . COPD Neg Hx   . Depression Neg Hx   . Diabetes  Neg Hx   . Drug abuse Neg Hx   . Early death Neg Hx   . Hearing loss Neg Hx   . Heart disease Neg Hx   . Hyperlipidemia Neg Hx   . Hypertension Neg Hx   . Kidney disease Neg Hx   . Learning disabilities Neg Hx   . Mental illness Neg Hx   . Mental retardation Neg Hx   . Miscarriages / Stillbirths Neg Hx   . Stroke Neg Hx   . Vision loss Neg Hx   . Varicose Veins Neg Hx    Allergies as of 09/18/2017      Reactions   Amoxicillin-pot Clavulanate Shortness Of Breath, Other (See Comments)   Reaction:  Chest pain  - pt cannot tolerate Augmentin - but can take Amoxicillin   Other Anaphylaxis, Other (See Comments)   Pt states that she is allergic to fresh fruit and tree nuts.     Ibuprofen Other (See Comments)   Causes ulcers   Cephalexin Rash   Latex Rash   Perineal Cleansing [balneol] Rash      Medication List        Accurate as of 09/18/17  11:11 AM. Always use your most recent med list.          albuterol 108 (90 Base) MCG/ACT inhaler Commonly known as:  PROVENTIL HFA;VENTOLIN HFA Use 2 puffs twice daily for 4-6 days, then PRN q6h cough.   PRENATAL 1 PO Prenatal       All past medical history, surgical history, allergies, family history, immunizations andmedications were updated in the EMR today and reviewed under the history and medication portions of their EMR.     ROS: Negative, with the exception of above mentioned in HPI   Objective:  BP 107/67 (BP Location: Right Arm, Patient Position: Sitting, Cuff Size: Normal)   Pulse 90   Temp 98.7 F (37.1 C)   Resp 20   Ht 5\' 7"  (1.702 m)   Wt 152 lb (68.9 kg)   LMP 02/15/2017   SpO2 97%   BMI 23.81 kg/m  Body mass index is 23.81 kg/m. Gen: Afebrile. No acute distress. Nontoxic in appearance, well developed, well nourished. [redacted] week gestation.  HENT: AT. [place]. Bilateral TM visualized with mild erythema and fullness. MMM, no oral lesions. Bilateral nares with erythema and drainage. Throat without erythema or  exudates. No cough Eyes:Pupils Equal Round Reactive to light, Extraocular movements intact,  Conjunctiva without  discharge or icterus. Mild redness bilateral eyes. Mild swelling bilateral eyes  Neck/lymp/endocrine: Supple,no  lymphadenopathy Neuro:  Normal gait. PERLA. EOMi. Alert. Oriented x3  No exam data present No results found. No results found for this or any previous visit (from the past 24 hour(s)).  Assessment/Plan: Alyssa Le is a 32 y.o. female present for OV for allergies. Seasonal allergies/Itchy eyes - her allergy regimen is usually rather extensive, however pregnancy prevents her from taking medications.  - Start claritin, flonase. - change air filters.  - patenol drops called in for her. Preg risk class C. Discussed this with her today and she should call her OB for recs before starting. - F/U PRN  Reviewed expectations re: course of current medical issues.  Discussed self-management of symptoms.  Outlined signs and symptoms indicating need for more acute intervention.  Patient verbalized understanding and all questions were answered.  Patient received an After-Visit Summary.    No orders of the defined types were placed in this encounter.    Note is dictated utilizing voice recognition software. Although note has been proof read prior to signing, occasional typographical errors still can be missed. If any questions arise, please do not hesitate to call for verification.   electronically signed by:  20, DO  [facility] - [place]

## 2017-09-18 NOTE — Patient Instructions (Signed)
Patenol prescribed. Check w/ ob to make sure ok, typically ok to take as directed.   Restart claritin and flonase.    Allergies An allergy is when your body reacts to a substance in a way that is not normal. An allergic reaction can happen after you:  Eat something.  Breathe in something.  Touch something.  You can be allergic to:  Things that are only around during certain seasons, like molds and pollens.  Foods.  Drugs.  Insects.  Animal dander.  What are the signs or symptoms?  Puffiness (swelling). This may happen on the lips, face, tongue, mouth, or throat.  Sneezing.  Coughing.  Breathing loudly (wheezing).  Stuffy nose.  Tingling in the mouth.  A rash.  Itching.  Itchy, red, puffy areas of skin (hives).  Watery eyes.  Throwing up (vomiting).  Watery poop (diarrhea).  Dizziness.  Feeling faint or fainting.  Trouble breathing or swallowing.  A tight feeling in the chest.  A fast heartbeat. How is this diagnosed? Allergies can be diagnosed with:  A medical and family history.  Skin tests.  Blood tests.  A food diary. A food diary is a record of all the foods, drinks, and symptoms you have each day.  The results of an elimination diet. This diet involves making sure not to eat certain foods and then seeing what happens when you start eating them again.  How is this treated? There is no cure for allergies, but allergic reactions can be treated with medicine. Severe reactions usually need to be treated at a hospital. How is this prevented? The best way to prevent an allergic reaction is to avoid the thing you are allergic to. Allergy shots and medicines can also help prevent reactions in some cases. This information is not intended to replace advice given to you by your health care provider. Make sure you discuss any questions you have with your health care provider. Document Released: 09/17/2012 Document Revised: 01/18/2016 Document  Reviewed: 03/04/2014 Elsevier Interactive Patient Education  [employer].

## 2017-10-03 DIAGNOSIS — Z23 Encounter for immunization: Secondary | ICD-10-CM | POA: Diagnosis not present

## 2017-10-19 DIAGNOSIS — O4443 Low lying placenta NOS or without hemorrhage, third trimester: Secondary | ICD-10-CM | POA: Diagnosis not present

## 2017-10-19 DIAGNOSIS — Z362 Encounter for other antenatal screening follow-up: Secondary | ICD-10-CM | POA: Diagnosis not present

## 2017-10-19 DIAGNOSIS — Z3A31 31 weeks gestation of pregnancy: Secondary | ICD-10-CM | POA: Diagnosis not present

## 2017-10-26 DIAGNOSIS — O4103X0 Oligohydramnios, third trimester, not applicable or unspecified: Secondary | ICD-10-CM | POA: Diagnosis not present

## 2017-10-26 DIAGNOSIS — Z3A32 32 weeks gestation of pregnancy: Secondary | ICD-10-CM | POA: Diagnosis not present

## 2017-11-16 DIAGNOSIS — Z3685 Encounter for antenatal screening for Streptococcus B: Secondary | ICD-10-CM | POA: Diagnosis not present

## 2017-11-16 LAB — OB RESULTS CONSOLE GBS: STREP GROUP B AG: NEGATIVE

## 2017-12-04 ENCOUNTER — Telehealth (HOSPITAL_COMMUNITY): Payer: Self-pay | Admitting: *Deleted

## 2017-12-04 ENCOUNTER — Encounter (HOSPITAL_COMMUNITY): Payer: Self-pay | Admitting: *Deleted

## 2017-12-04 NOTE — Telephone Encounter (Signed)
Preadmission screen  

## 2017-12-12 ENCOUNTER — Encounter (HOSPITAL_COMMUNITY): Payer: Self-pay

## 2017-12-12 ENCOUNTER — Other Ambulatory Visit: Payer: Self-pay

## 2017-12-12 ENCOUNTER — Inpatient Hospital Stay (HOSPITAL_COMMUNITY)
Admission: RE | Admit: 2017-12-12 | Discharge: 2017-12-14 | DRG: 768 | Disposition: A | Discharge status: Home / Self Care | Payer: BLUE CROSS/BLUE SHIELD | Attending: Obstetrics and Gynecology | Admitting: Obstetrics and Gynecology

## 2017-12-12 ENCOUNTER — Inpatient Hospital Stay (HOSPITAL_COMMUNITY): Payer: BLUE CROSS/BLUE SHIELD | Admitting: Anesthesiology

## 2017-12-12 DIAGNOSIS — Z3A39 39 weeks gestation of pregnancy: Secondary | ICD-10-CM

## 2017-12-12 DIAGNOSIS — O26893 Other specified pregnancy related conditions, third trimester: Secondary | ICD-10-CM | POA: Diagnosis not present

## 2017-12-12 LAB — TYPE AND SCREEN
ABO/RH(D): O POS
ANTIBODY SCREEN: NEGATIVE

## 2017-12-12 LAB — DIC (DISSEMINATED INTRAVASCULAR COAGULATION)PANEL
D-Dimer, Quant: 2.8 ug/mL-FEU — ABNORMAL HIGH (ref 0.00–0.50)
Fibrinogen: 547 mg/dL — ABNORMAL HIGH (ref 210–475)
Prothrombin Time: 12.4 seconds (ref 11.4–15.2)
Smear Review: NONE SEEN

## 2017-12-12 LAB — CBC
HCT: 33.8 % — ABNORMAL LOW (ref 36.0–46.0)
HCT: 30.1 % — ABNORMAL LOW (ref 36.0–46.0)
HEMATOCRIT: 32 % — AB (ref 36.0–46.0)
HEMATOCRIT: 34.2 % — AB (ref 36.0–46.0)
HEMOGLOBIN: 10.3 g/dL — AB (ref 12.0–15.0)
HEMOGLOBIN: 11 g/dL — AB (ref 12.0–15.0)
Hemoglobin: 11 g/dL — ABNORMAL LOW (ref 12.0–15.0)
Hemoglobin: 10 g/dL — ABNORMAL LOW (ref 12.0–15.0)
MCH: 28.3 pg (ref 26.0–34.0)
MCH: 28.4 pg (ref 26.0–34.0)
MCH: 28.8 pg (ref 26.0–34.0)
MCH: 28.9 pg (ref 26.0–34.0)
MCHC: 32.2 g/dL (ref 30.0–36.0)
MCHC: 32.2 g/dL (ref 30.0–36.0)
MCHC: 32.5 g/dL (ref 30.0–36.0)
MCHC: 33.2 g/dL (ref 30.0–36.0)
MCV: 87.9 fL (ref 78.0–100.0)
MCV: 88.1 fL (ref 78.0–100.0)
MCV: 88.5 fL (ref 78.0–100.0)
MCV: 87 fL (ref 78.0–100.0)
PLATELETS: 198 10*3/uL (ref 150–400)
PLATELETS: 186 10*3/uL (ref 150–400)
Platelets: 200 10*3/uL (ref 150–400)
Platelets: 196 10*3/uL (ref 150–400)
RBC: 3.64 MIL/uL — ABNORMAL LOW (ref 3.87–5.11)
RBC: 3.82 MIL/uL — AB (ref 3.87–5.11)
RBC: 3.88 MIL/uL (ref 3.87–5.11)
RBC: 3.46 MIL/uL — ABNORMAL LOW (ref 3.87–5.11)
RDW: 13.7 % (ref 11.5–15.5)
RDW: 13.7 % (ref 11.5–15.5)
RDW: 13.8 % (ref 11.5–15.5)
RDW: 13.7 % (ref 11.5–15.5)
WBC: 9.5 10*3/uL (ref 4.0–10.5)
WBC: 12 10*3/uL — AB (ref 4.0–10.5)
WBC: 15.8 10*3/uL — ABNORMAL HIGH (ref 4.0–10.5)
WBC: 13.3 10*3/uL — AB (ref 4.0–10.5)

## 2017-12-12 LAB — RPR: RPR Ser Ql: NONREACTIVE

## 2017-12-12 LAB — POSTPARTUM HEMORRHAGE PROTOCOL (BB NOTIFICATION)

## 2017-12-12 LAB — DIC (DISSEMINATED INTRAVASCULAR COAGULATION) PANEL
APTT: 29 s (ref 24–36)
INR: 0.93
PLATELETS: 194 10*3/uL (ref 150–400)

## 2017-12-12 MED ORDER — ONDANSETRON HCL 4 MG/2ML IJ SOLN: 4.0000 mg | Freq: Four times a day (QID) | INTRAMUSCULAR | Status: DC | PRN

## 2017-12-12 MED ORDER — TETANUS-DIPHTH-ACELL PERTUSSIS 5-2.5-18.5 LF-MCG/0.5 IM SUSP: 0.5000 mL | Freq: Once | INTRAMUSCULAR | Status: DC

## 2017-12-12 MED ORDER — DIPHENHYDRAMINE HCL 25 MG PO CAPS: 25.0000 mg | ORAL_CAPSULE | Freq: Four times a day (QID) | ORAL | Status: DC | PRN

## 2017-12-12 MED ORDER — SODIUM CHLORIDE 0.9 % IV SOLN: 1.5000 g | Freq: Once | INTRAVENOUS | Status: DC

## 2017-12-12 MED ORDER — BENZOCAINE-MENTHOL 20-0.5 % EX AERO: 1.0000 "application " | INHALATION_SPRAY | CUTANEOUS | Status: DC | PRN

## 2017-12-12 MED ORDER — EPHEDRINE 5 MG/ML INJ
10.0000 mg | INTRAVENOUS | Status: DC | PRN
  Filled 2017-12-12: qty 2

## 2017-12-12 MED ORDER — LIDOCAINE HCL (PF) 1 % IJ SOLN
INTRAMUSCULAR | Status: DC | PRN
  Administered 2017-12-12: 6 mL via EPIDURAL
  Administered 2017-12-12: 4 mL via EPIDURAL

## 2017-12-12 MED ORDER — LACTATED RINGERS IV SOLN
500.0000 mL | Freq: Once | INTRAVENOUS | Status: AC
  Administered 2017-12-12: 500 mL via INTRAVENOUS

## 2017-12-12 MED ORDER — MISOPROSTOL 200 MCG PO TABS
800.0000 ug | ORAL_TABLET | Freq: Once | ORAL | Status: AC
  Administered 2017-12-12: 800 ug via RECTAL

## 2017-12-12 MED ORDER — DIBUCAINE 1 % RE OINT: 1.0000 "application " | TOPICAL_OINTMENT | RECTAL | Status: DC | PRN

## 2017-12-12 MED ORDER — OXYCODONE HCL 5 MG PO TABS: 10.0000 mg | ORAL_TABLET | ORAL | Status: DC | PRN

## 2017-12-12 MED ORDER — ZOLPIDEM TARTRATE 5 MG PO TABS: 5.0000 mg | ORAL_TABLET | Freq: Every evening | ORAL | Status: DC | PRN

## 2017-12-12 MED ORDER — GENTAMICIN SULFATE 40 MG/ML IJ SOLN
1.5000 mg/kg | Freq: Once | INTRAVENOUS | Status: AC
  Administered 2017-12-12: 110 mg via INTRAVENOUS
  Filled 2017-12-12: qty 2.75

## 2017-12-12 MED ORDER — ALBUTEROL SULFATE (2.5 MG/3ML) 0.083% IN NEBU: 3.0000 mL | INHALATION_SOLUTION | RESPIRATORY_TRACT | Status: DC | PRN

## 2017-12-12 MED ORDER — PRENATAL MULTIVITAMIN CH
1.0000 | ORAL_TABLET | Freq: Every day | ORAL | Status: DC
  Administered 2017-12-13: 1 via ORAL
  Filled 2017-12-12: qty 1

## 2017-12-12 MED ORDER — LIDOCAINE HCL (PF) 1 % IJ SOLN
30.0000 mL | INTRAMUSCULAR | Status: DC | PRN
  Filled 2017-12-12: qty 30

## 2017-12-12 MED ORDER — ONDANSETRON HCL 4 MG PO TABS: 4.0000 mg | ORAL_TABLET | ORAL | Status: DC | PRN

## 2017-12-12 MED ORDER — OXYTOCIN 40 UNITS IN LACTATED RINGERS INFUSION - SIMPLE MED
1.0000 m[IU]/min | INTRAVENOUS | Status: DC
  Administered 2017-12-12: 2 m[IU]/min via INTRAVENOUS

## 2017-12-12 MED ORDER — MISOPROSTOL 200 MCG PO TABS
ORAL_TABLET | ORAL | Status: AC
  Filled 2017-12-12: qty 4

## 2017-12-12 MED ORDER — LACTATED RINGERS IV SOLN
INTRAVENOUS | Status: DC
  Administered 2017-12-12: 125 mL via INTRAVENOUS
  Administered 2017-12-12: 125 mL/h via INTRAVENOUS
  Administered 2017-12-12: 17:00:00 via INTRAVENOUS

## 2017-12-12 MED ORDER — METHYLERGONOVINE MALEATE 0.2 MG/ML IJ SOLN: 0.2000 mg | Freq: Once | INTRAMUSCULAR | Status: DC

## 2017-12-12 MED ORDER — IBUPROFEN 600 MG PO TABS: 600.0000 mg | ORAL_TABLET | Freq: Four times a day (QID) | ORAL | Status: DC

## 2017-12-12 MED ORDER — PHENYLEPHRINE 40 MCG/ML (10ML) SYRINGE FOR IV PUSH (FOR BLOOD PRESSURE SUPPORT)
80.0000 ug | PREFILLED_SYRINGE | INTRAVENOUS | Status: DC | PRN
  Filled 2017-12-12: qty 10
  Filled 2017-12-12: qty 5

## 2017-12-12 MED ORDER — FENTANYL 2.5 MCG/ML BUPIVACAINE 1/10 % EPIDURAL INFUSION (WH - ANES)
14.0000 mL/h | INTRAMUSCULAR | Status: DC | PRN
  Administered 2017-12-12: 14 mL/h via EPIDURAL
  Filled 2017-12-12: qty 100

## 2017-12-12 MED ORDER — FLEET ENEMA 7-19 GM/118ML RE ENEM: 1.0000 | ENEMA | RECTAL | Status: DC | PRN

## 2017-12-12 MED ORDER — COCONUT OIL OIL
1.0000 "application " | TOPICAL_OIL | Status: DC | PRN
  Administered 2017-12-12: 1 via TOPICAL
  Filled 2017-12-12: qty 120

## 2017-12-12 MED ORDER — TRANEXAMIC ACID 1000 MG/10ML IV SOLN
1000.0000 mg | Freq: Once | INTRAVENOUS | Status: AC
  Administered 2017-12-12: 1000 mg via INTRAVENOUS
  Filled 2017-12-12: qty 1100

## 2017-12-12 MED ORDER — SOD CITRATE-CITRIC ACID 500-334 MG/5ML PO SOLN: 30.0000 mL | ORAL | Status: DC | PRN

## 2017-12-12 MED ORDER — WITCH HAZEL-GLYCERIN EX PADS: 1.0000 "application " | MEDICATED_PAD | CUTANEOUS | Status: DC | PRN

## 2017-12-12 MED ORDER — LACTATED RINGERS IV SOLN
500.0000 mL | INTRAVENOUS | Status: DC | PRN
  Administered 2017-12-12: 500 mL via INTRAVENOUS

## 2017-12-12 MED ORDER — ONDANSETRON HCL 4 MG/2ML IJ SOLN: 4.0000 mg | INTRAMUSCULAR | Status: DC | PRN

## 2017-12-12 MED ORDER — OXYCODONE-ACETAMINOPHEN 5-325 MG PO TABS: 1.0000 | ORAL_TABLET | ORAL | Status: DC | PRN

## 2017-12-12 MED ORDER — ACETAMINOPHEN 325 MG PO TABS: 650.0000 mg | ORAL_TABLET | ORAL | Status: DC | PRN

## 2017-12-12 MED ORDER — METHYLERGONOVINE MALEATE 0.2 MG/ML IJ SOLN
INTRAMUSCULAR | Status: AC
  Administered 2017-12-12: 0.2 mg
  Filled 2017-12-12: qty 1

## 2017-12-12 MED ORDER — TERBUTALINE SULFATE 1 MG/ML IJ SOLN: 0.2500 mg | Freq: Once | INTRAMUSCULAR | Status: DC | PRN

## 2017-12-12 MED ORDER — SENNOSIDES-DOCUSATE SODIUM 8.6-50 MG PO TABS
2.0000 | ORAL_TABLET | ORAL | Status: DC
  Administered 2017-12-12 – 2017-12-13 (×2): 2 via ORAL
  Filled 2017-12-12 (×2): qty 2

## 2017-12-12 MED ORDER — DIPHENHYDRAMINE HCL 50 MG/ML IJ SOLN: 12.5000 mg | INTRAMUSCULAR | Status: DC | PRN

## 2017-12-12 MED ORDER — OXYCODONE HCL 5 MG PO TABS: 5.0000 mg | ORAL_TABLET | ORAL | Status: DC | PRN

## 2017-12-12 MED ORDER — SIMETHICONE 80 MG PO CHEW: 80.0000 mg | CHEWABLE_TABLET | ORAL | Status: DC | PRN

## 2017-12-12 MED ORDER — ACETAMINOPHEN 325 MG PO TABS
650.0000 mg | ORAL_TABLET | ORAL | Status: DC | PRN
  Administered 2017-12-13: 650 mg via ORAL
  Filled 2017-12-12: qty 2

## 2017-12-12 MED ORDER — OXYCODONE-ACETAMINOPHEN 5-325 MG PO TABS: 2.0000 | ORAL_TABLET | ORAL | Status: DC | PRN

## 2017-12-12 MED ORDER — PHENYLEPHRINE 40 MCG/ML (10ML) SYRINGE FOR IV PUSH (FOR BLOOD PRESSURE SUPPORT)
80.0000 ug | PREFILLED_SYRINGE | INTRAVENOUS | Status: DC | PRN
  Filled 2017-12-12: qty 5

## 2017-12-12 MED ORDER — OXYTOCIN BOLUS FROM INFUSION
500.0000 mL | Freq: Once | INTRAVENOUS | Status: AC
  Administered 2017-12-12 (×2): 500 mL via INTRAVENOUS

## 2017-12-12 MED ORDER — OXYTOCIN 40 UNITS IN LACTATED RINGERS INFUSION - SIMPLE MED
2.5000 [IU]/h | INTRAVENOUS | Status: DC
  Filled 2017-12-12: qty 1000

## 2017-12-12 NOTE — H&P (Signed)
Alyssa Le is a 32 [id number] female presenting for elective iol. Dated per LMP and confirmed with 10week [place]. GBS neg. Benign prenatal care: low lying placenta resolved at 34 weeks. Hx asthma - no comps.  Hx svd x 34. Declined all screening . OB History    Gravida  4   Para  3   Term  3   Preterm      AB      Living  3     SAB      TAB      Ectopic      Multiple  0   Live Births  3          Past Medical History:  Diagnosis Date  . Allergy    multiple food & environmental allergies (tree pollens & fruits)  . Arthritis    exercise & allergy induced  . Asthma    inhaler last used 2015  . Complication of anesthesia    post operative pulmonary edema  . Headache   . Hx of gastric ulcer   . Ovarian cyst, right    surgically removed. 32 y.o.   Past Surgical History:  Procedure Laterality Date  . APPENDECTOMY    . 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 OVARIAN CYST REMOVAL     stump appendectomy, Pulmonary edema post op   Family History: family history is not on file. She was adopted. Social History:  reports that she has never smoked. She has never used smokeless tobacco. She reports that she does not drink alcohol or use drugs.     Maternal Diabetes: No Genetic Screening: Declined Maternal Ultrasounds/Referrals: Normal Fetal Ultrasounds or other Referrals:  None Maternal Substance Abuse:  No Significant Maternal Medications:  None Significant Maternal Lab Results:  Lab values include: Group B Strep negative Other Comments:  None  Review of Systems  Constitutional: Positive for malaise/fatigue. Negative for chills, fever and weight loss.  Eyes: Negative for blurred vision.  Respiratory: Negative for shortness of breath.   Cardiovascular: Negative for chest pain.  Gastrointestinal: Negative for heartburn, nausea and vomiting.  Genitourinary: Negative for dysuria.  Musculoskeletal:  Negative for myalgias.  Skin: Negative for itching and rash.  Neurological: Negative for dizziness and headaches.  Endo/Heme/Allergies: Does not bruise/bleed easily.  Psychiatric/Behavioral: Negative for depression, hallucinations, substance abuse and suicidal ideas. The patient is not nervous/anxious.    Maternal Medical History:  Reason for admission: Nausea. Elective iol  Contractions: Frequency: rare.   Perceived severity is mild.    Fetal activity: Perceived fetal activity is normal.   Last perceived fetal movement was within the past hour.    Prenatal complications: Low lying placenta - resolved; asthma  Prenatal Complications - Diabetes: none.    Dilation: 5 Effacement (%): 90 Station: -2 Exam by:: [employer], RN Blood pressure 118/74, pulse 81, temperature 98.1 F (36.7 C), temperature source Oral, resp. rate 18, height 5\' 6"  (1.676 m), weight 165 lb (74.8 kg), last menstrual period 02/15/2017, currently breastfeeding. Maternal Exam:  Uterine Assessment: Contraction strength is mild.  Contraction frequency is irregular.   Abdomen: Patient reports generalized tenderness.  Estimated fetal weight is AGA.   Fetal presentation: vertex  Introitus: Normal vulva. Vulva is negative for condylomata and lesion.  Normal vagina.  Vagina is negative for condylomata and edema.  Pelvis: adequate for delivery.   Cervix: Cervix evaluated by digital exam.  Fetal Exam Fetal Monitor Review: Baseline rate: 150s.  Variability: moderate (6-25 bpm).   Pattern: variable decelerations and accelerations present.    Fetal State Assessment: Category I - tracings are normal.     Physical Exam  Constitutional: She is oriented to person, place, and time. She appears well-developed and well-nourished.  Eyes: Pupils are equal, round, and reactive to light.  Cardiovascular: Normal rate.  Respiratory: Effort normal.  GI: Soft. There is generalized tenderness.  Genitourinary:  Vagina normal and uterus normal. Vulva exhibits no lesion.  Musculoskeletal: Normal range of motion.  Neurological: She is alert and oriented to person, place, and time.  Skin: Skin is warm.  Psychiatric: She has a normal mood and affect. Her behavior is normal. Judgment and thought content normal.    Prenatal labs: ABO, Rh: --/--/PENDING (07/09 0804) Antibody: NEG (07/09 0804) Rubella: Immune (12/13 0000) RPR: Nonreactive (12/13 0000)  HBsAg: Negative (12/13 0000)  HIV: Non-reactive (12/13 0000)  GBS: Negative (06/13 0000)   Assessment/Plan: [id number] at 39 4/7wks for elective iol PIT/AROM Pain control prn Anticipate svd GBS neg  Janean Sark Hanan Moen 12/12/2017, 9:57 AM

## 2017-12-12 NOTE — Lactation Note (Signed)
This note was copied from a baby's chart. Lactation Consultation Note  Patient Name: Alyssa Le NWGNF'A Date: 12/12/2017 Reason for consult: Initial assessment;Term  8 hours old FT female who is being exclusively BF by her mother, she's a P4 and experienced BF. She was able to BF her first child for 1 week, her second one for 13 months and her third one for 30 months. She has a [place] pump and a DEBP at home.  Baby was already nursing when entering the room, but the latch was shallow, when baby finished eating noticed a blister on the nipple, and it was also pinched. Mom had baby swaddled in a blanket and she was also wearing clothes. Explained to parents the benefits of STS. Both nipples showed signs of trauma upon examination, but the left one was the worse, it was raw. Gave mom some comfort gels and explained to her not to use it together with her coconut oil.  When doing hand expression, colostrum just squirted out of her nipples, mom has a strong MER, but when baby was at the breast only a few swallows were heard. Show mom how to do breast compressions to maximize milk transfer and the depth of the latch. Asked mom if it's too painful to latch baby to the breast she can either pump or hand express and spoon feed baby until her nipples heal. LC will need to reassess tomorrow.  Encouraged mom feeding baby STS 8-12 times/24 hours or sooner if feeding cues are present. She can also spoon feed or syringe feed if she decides to hand express or pump, she'll let her RN know if she would like be set up with a DEBP. Reviewed BF brochure, BF resources and feeding diary, both parents are aware of LC services and will call PRN.  Maternal Data Formula Feeding for Exclusion: No Has patient been taught Hand Expression?: Yes Does the patient have breastfeeding experience prior to this delivery?: Yes  Feeding Feeding Type: Breast Fed Length of feed: 10 min  LATCH Score Latch: Repeated attempts needed  to sustain latch, nipple held in mouth throughout feeding, stimulation needed to elicit sucking reflex.  Audible Swallowing: A few with stimulation  Type of Nipple: Everted at rest and after stimulation  Comfort (Breast/Nipple): Filling, red/small blisters or bruises, mild/mod discomfort  Hold (Positioning): Assistance needed to correctly position infant at breast and maintain latch.(mom constantly kept positioning infant in classic cradle and with wrapped in blanket)  LATCH Score: 6  Interventions Interventions: Breast feeding basics reviewed;Assisted with latch;Skin to skin;Breast massage;Hand express;Breast compression;Adjust position;Comfort gels;Coconut oil;Support pillows;Position options  Lactation Tools Discussed/Used Tools: Comfort gels;Coconut oil WIC Program: No   Consult Status Consult Status: Follow-up Date: 12/13/17 Follow-up type: In-patient    Ondria Oswald Venetia Constable 12/12/2017, 10:23 PM

## 2017-12-12 NOTE — Progress Notes (Signed)
Patient ID: [name], female   DOB: 01/05/1986, 32 y.o.   MRN: [id number] Pt with passage of large clots about 30,ins after delivery. Had hemorrhage after last delivery Pitocin run open, rectal cytotec placed No vaginal or cervical lacerations; Fundus firm then boggy and noted continued clotting on manual exam ; manually evacuated.  Methergine given then also txa and bleeding slowed down but still trickling.  Bakri placed with of saline Repeat Hg after bleeding noted was 11 ( from 10) Repeat after another hour EBL Unasyn 1.5mg  given Pt coherent and with stable vitals throughout  Everything was explained as proceeding DIC panel run

## 2017-12-12 NOTE — Anesthesia Preprocedure Evaluation (Signed)
Anesthesia Evaluation  Patient identified by MRN, date of birth, ID band Patient awake    Reviewed: Allergy & Precautions, H&P , Patient's Chart, lab work & pertinent test results  History of Anesthesia Complications (+) history of anesthetic complications (hx postop pulmonary edema)  Airway Mallampati: II  TM Distance: >3 FB Neck ROM: Full    Dental   Pulmonary asthma ,    breath sounds clear to auscultation       Cardiovascular negative cardio ROS   Rhythm:Regular Rate:Normal     Neuro/Psych  Headaches, negative psych ROS   GI/Hepatic Neg liver ROS, PUD,   Endo/Other  negative endocrine ROS  Renal/GU negative Renal ROS  negative genitourinary   Musculoskeletal negative musculoskeletal ROS (+)   Abdominal   Peds  Hematology  (+) anemia ,   Anesthesia Other Findings   Reproductive/Obstetrics (+) Pregnancy                             Anesthesia Physical  Anesthesia Plan  ASA: II  Anesthesia Plan: Epidural   Post-op Pain Management:    Induction:   PONV Risk Score and Plan: Treatment may vary due to age or medical condition  Airway Management Planned: Natural Airway  Additional Equipment: None  Intra-op Plan:   Post-operative Plan:   Informed Consent: I have reviewed the patients History and Physical, chart, labs and discussed the procedure including the risks, benefits and alternatives for the proposed anesthesia with the patient or authorized representative who has indicated his/her understanding and acceptance.     Plan Discussed with: Anesthesiologist  Anesthesia Plan Comments: (Labs reviewed. Platelets acceptable, patient not taking any blood thinning medications. Risks and benefits discussed with patient, patient expressed understanding and wished to proceed.)        Anesthesia Quick Evaluation

## 2017-12-12 NOTE — Progress Notes (Signed)
Patient ID: Alyssa Le, female   DOB: [birth year], 32 y.o.   MRN: [id number] Pt doing well with no complaints. Appreciating contractions but not painful. +Fms VSS SVE - 5/90/-2 EFM - cat 150s, occ variables; great variability TOCO - contractions q 5-13mins  A/P: [id number] at term - elective iol; doing well on pit         AROM performed with clear fluid         Expectant mgmt

## 2017-12-12 NOTE — Progress Notes (Signed)
Patient ID: Alyssa Le, female   DOB: Mar 18, 1986, 32 y.o.   MRN: [id number] Pt doing well with no complaints. Bonding well with baby. Denies lightheadedness.  VSS ABD - FF EXT - no homas  Hg 10   A/P: PPD#0 s/p svd with pp hemorrhage          Bakri in place with minimal return          Hemodynamically stable          Repeat cbc in am; remove foley and epidural catheter in am if continues to remain stable'           Routine pp care

## 2017-12-12 NOTE — Anesthesia Procedure Notes (Signed)
Epidural Patient location during procedure: OB Start time: 12/12/2017 11:58 AM End time: 12/12/2017 12:05 PM  Staffing Anesthesiologist: Beryle Lathe, MD Performed: anesthesiologist   Preanesthetic Checklist Completed: patient identified, pre-op evaluation, timeout performed, IV checked, risks and benefits discussed and monitors and equipment checked  Epidural Patient position: sitting Prep: DuraPrep Patient monitoring: continuous pulse ox and blood pressure Approach: midline Location: L2-L3 Injection technique: LOR saline  Needle:  Needle type: Tuohy  Needle gauge: 17 G Needle length: 9 cm Needle insertion depth: 5 cm Catheter size: 19 Gauge Catheter at skin depth: 10 cm Test dose: negative and Other (1% lidocaine)  Additional Notes Patient identified. Risks including, but not limited to, bleeding, infection, nerve damage, paralysis, inadequate analgesia, blood pressure changes, nausea, vomiting, allergic reaction, postpartum back pain, itching, and headache were discussed. Patient expressed understanding and wished to proceed. Sterile prep and drape, including hand hygiene, mask, and sterile gloves were used. The patient was positioned and the spine was prepped. The skin was anesthetized with lidocaine. No paraesthesia or other complication noted. The patient did not experience any signs of intravascular injection such as tinnitus or metallic taste in mouth, nor signs of intrathecal spread such as rapid motor block. Please see nursing notes for vital signs. The patient tolerated the procedure well.   Leslye Peer, MDReason for block:procedure for pain

## 2017-12-13 LAB — CBC
HEMATOCRIT: 29.5 % — AB (ref 36.0–46.0)
HEMOGLOBIN: 9.7 g/dL — AB (ref 12.0–15.0)
MCH: 28.6 pg (ref 26.0–34.0)
MCHC: 32.9 g/dL (ref 30.0–36.0)
MCV: 87 fL (ref 78.0–100.0)
Platelets: 180 10*3/uL (ref 150–400)
RBC: 3.39 MIL/uL — AB (ref 3.87–5.11)
RDW: 13.9 % (ref 11.5–15.5)
WBC: 13.4 10*3/uL — AB (ref 4.0–10.5)

## 2017-12-13 MED ORDER — DIPHENHYDRAMINE-ZINC ACETATE 2-0.1 % EX CREA
TOPICAL_CREAM | Freq: Three times a day (TID) | CUTANEOUS | Status: DC | PRN
  Administered 2017-12-13: 21:00:00 via TOPICAL
  Filled 2017-12-13: qty 28

## 2017-12-13 NOTE — Progress Notes (Signed)
Post Partum Day #1 Subjective: tolerating PO and no heavy vaginal bleeding  Objective: Blood pressure 98/64, pulse 89, temperature 98.7 F (37.1 C), temperature source Oral, resp. rate 18, height 5\' 6"  (1.676 m), weight 74.8 kg (165 lb), last menstrual period 02/15/2017, SpO2 99 %, unknown if currently breastfeeding.  Physical Exam:  General: alert Lochia: appropriate Uterine Fundus: firm Bakri balloon and foley in place, 200 cc removed from Bakri balloon  Recent Labs    12/12/17 2104 12/13/17 0514  HGB 10.0* 9.7*  HCT 30.1* 29.5*    Assessment/Plan: Doing well from postpartum hemorrhage, Hgb stable.  Will gradually remove Bakri balloon and monitor for increased bleeding   LOS: 1 day   02/13/18 12/13/2017, 8:58 AM

## 2017-12-13 NOTE — Anesthesia Postprocedure Evaluation (Signed)
Anesthesia Post Note  Patient: Alyssa Le  Procedure(s) Performed: AN AD HOC LABOR EPIDURAL     Patient location during evaluation: Mother Baby Anesthesia Type: Epidural Level of consciousness: awake and alert and oriented Pain management: satisfactory to patient Vital Signs Assessment: post-procedure vital signs reviewed and stable Respiratory status: spontaneous breathing and nonlabored ventilation Cardiovascular status: stable Postop Assessment: no headache, no backache, no signs of nausea or vomiting, adequate PO intake and patient able to bend at knees (patient up walking) Anesthetic complications: no    Last Vitals:  Vitals:   12/13/17 0438 12/13/17 0744  BP:  98/64  Pulse: 89 89  Resp:  18  Temp:  37.1 C  SpO2: 100% 99%    Last Pain:  Vitals:   12/13/17 0800  TempSrc:   PainSc: 2    Pain Goal: Patients Stated Pain Goal: 3 (12/13/17 0800)               Madison Hickman

## 2017-12-13 NOTE — Lactation Note (Addendum)
This note was copied from a baby's chart. Lactation Consultation Note  Patient Name: Alyssa Le [id number]'D Date: 12/13/2017 Reason for consult: Follow-up assessment  Mom starting to feed on arrival.  Assisted with laid back breastfeeding.  Mom reports hurts first minute and then feels better.  Infant in close/mouth open wide/lips flanged a few audible swallows heard. Mom continuosly wants to look at the latch and lets her slide down a little and pulls out some of the areolar tissue.  Urged mom to add compression to bf. Infant fell asleep while feeding/unable to keep her awake with stimulation.  Mom broke suction took her off.  nipple slightly compressed.  Urged hand express/rub emm on nipples/air dry/and then [place] comfort gels in between.  Urged mom to feed sts and to vary bf positions. Urged hand expression and spoon feeding past bf and feed back all emm. Mom reports she has had pain with all her infants with bf in the beginning,  Plans to exclusively breastfeed.  Praised efforts. Urged her to feed her at early hunger cues. Maternal Data Has patient been taught Hand Expression?: Yes Does the patient have breastfeeding experience prior to this delivery?: Yes  Feeding Feeding Type: Breast Fed  LATCH Score Latch: Grasps breast easily, tongue down, lips flanged, rhythmical sucking.  Audible Swallowing: A few with stimulation  Type of Nipple: Everted at rest and after stimulation  Comfort (Breast/Nipple): Filling, red/small blisters or bruises, mild/mod discomfort  Hold (Positioning): Assistance needed to correctly position infant at breast and maintain latch.  LATCH Score: 7  Interventions Interventions: Assisted with latch  Lactation Tools Discussed/Used     Consult Status Consult Status: Follow-up Date: 12/14/17 Follow-up type: In-patient    [facility] Michaelle Copas 12/13/2017, 3:46 PM

## 2017-12-13 NOTE — Progress Notes (Signed)
100 ml removed from bakri balloon.

## 2017-12-13 NOTE — Progress Notes (Signed)
Doing well with Bakri balloon and foley catheter out, no heavy bleeding

## 2017-12-14 MED ORDER — ACETAMINOPHEN-CODEINE 300-30 MG PO TABS: 1.0000 | ORAL_TABLET | Freq: Four times a day (QID) | ORAL | 0 refills | Status: AC | PRN

## 2017-12-14 NOTE — Lactation Note (Signed)
This note was copied from a baby's chart. Lactation Consultation Note  Patient Name: Alyssa Le [id number] Date: 12/14/2017 Reason for consult: Follow-up assessment  Visited with P4 Mom of term baby at 76 hrs old.  Baby at 8% weight loss.  Mom states breasts are filling.  Baby on the breast in cradle hold.  Latch noted to be deep, and swallowing identified regularly.  Mom declines needing assistance. Reviewed importance of keeping baby STS at breast, and feeding often on cue.  Goal of 8-12 feedings per 24 hrs.   Engorgement prevention and treatment discussed.   Mom aware of OP lactation services, and encouraged to call prn for assistance.   Feeding Feeding Type: Breast Fed  LATCH Score Latch: Grasps breast easily, tongue down, lips flanged, rhythmical sucking.  Audible Swallowing: Spontaneous and intermittent  Type of Nipple: Everted at rest and after stimulation  Comfort (Breast/Nipple): Filling, red/small blisters or bruises, mild/mod discomfort  Hold (Positioning): No assistance needed to correctly position infant at breast.  LATCH Score: 9  Interventions Interventions: Breast feeding basics reviewed;Breast compression  Consult Status Consult Status: Complete Date: 12/14/17 Follow-up type: Call as needed    Judee Clara 12/14/2017, 12:56 PM

## 2017-12-14 NOTE — Progress Notes (Signed)
Discharge instructions given, questions answered, pt states understanding, signed and given copy 

## 2017-12-14 NOTE — Discharge Instructions (Signed)
Nothing in vagina for 6 weeks.  No sex, tampons, and douching.  Other instructions as in [facility] Discharge Booklet. °

## 2017-12-14 NOTE — Discharge Summary (Signed)
OB Discharge Summary     Patient Name: Alyssa Le DOB: 03-Jul-1985 MRN: [id number]  Date of admission: 12/12/2017 Delivering MD: Pryor Ochoa [facility]   Date of discharge: 12/14/2017  Admitting diagnosis: INDUCTION Intrauterine pregnancy: [redacted]w[redacted]d     Secondary diagnosis:  Active Problems:   Indication for care in labor or delivery   SVD (spontaneous vaginal delivery)   Postpartum care following vaginal delivery  Additional problems: postpartum hemorrhage     Discharge diagnosis: Term Pregnancy Delivered and PPH                                                                                                Post partum procedures:bakri balloon  Augmentation: AROM and Pitocin  Complications: Hemorrhage>1035mL  Hospital course:  Induction of Labor With Vaginal Delivery   32 y.o. yo [id number] at [redacted]w[redacted]d was admitted to the hospital 12/12/2017 for induction of labor.  Indication for induction: Favorable cervix at term.  Patient had an uncomplicated labor course as follows: Membrane Rupture Time/Date: 9:51 AM ,12/12/2017   Intrapartum Procedures: Episiotomy: None [1]                                         Lacerations:  None [1]  Patient had delivery of a Viable infant.  Information for the patient's newborn:  Rheannon, Cerney [[id number]]  Delivery Method: Vag-Spont   12/12/2017  Details of delivery can be found in separate delivery note.  Patient had a routine postpartum course. Patient is discharged home 12/14/17.  Physical exam  Vitals:   12/13/17 2025 12/14/17 0358 12/14/17 0640 12/14/17 0735  BP: 101/60 (!) 84/56 111/62 106/62  Pulse: 86 86 75 79  Resp: 17 18 18 18   Temp: 98 F (36.7 C) 98.7 F (37.1 C)  98.8 F (37.1 C)  TempSrc: Oral Oral  Oral  SpO2: 100% 98% 100% 100%  Weight:      Height:       General: alert, cooperative and no distress Lochia: appropriate Uterine Fundus: firm Incision: N/A DVT Evaluation: No evidence of DVT seen on physical exam. Labs: Lab  Results  Component Value Date   WBC 13.4 (H) 12/13/2017   HGB 9.7 (L) 12/13/2017   HCT 29.5 (L) 12/13/2017   MCV 87.0 12/13/2017   PLT 180 12/13/2017   CMP Latest Ref Rng & Units 01/19/2017  Glucose 70 - 99 mg/dL -  BUN 4 - 21 13  Creatinine 0.5 - 1.1 0.7  Sodium 137 - 147 140  Potassium 3.4 - 5.3 4.3  Chloride 96 - 112 mmol/L -  CO2 19 - 32 mmol/L -  Calcium 8.4 - 10.5 mg/dL -  Total Protein 6.0 - 8.3 g/dL -  Total Bilirubin 0.3 - 1.2 mg/dL -  Alkaline Phos 25 - 01/21/2017 44  AST 13 - 35 17  ALT 7 - 35 15    Discharge instruction: per After Visit Summary and "Baby and Me Booklet".  After visit meds:  Allergies as of 12/14/2017  Reactions   Amoxicillin-pot Clavulanate Shortness Of Breath, Other (See Comments)   Reaction:  Chest pain  - pt cannot tolerate Augmentin - but can take Amoxicillin   Apple Anaphylaxis   Other Anaphylaxis, Other (See Comments)   Pt states that she is allergic to fresh fruit and tree nuts.     Ibuprofen Other (See Comments)   Causes ulcers   Tree Extract    Cephalexin Rash   Latex Rash   Perineal Cleansing [balneol] Rash      Medication List    TAKE these medications   Acetaminophen-Codeine 300-30 MG tablet Commonly known as:  TYLENOL/CODEINE #3 Take 1 tablet by mouth every 6 (six) hours as needed for up to 5 days for pain.   albuterol 108 (90 Base) MCG/ACT inhaler Commonly known as:  PROVENTIL HFA;VENTOLIN HFA Use 2 puffs twice daily for 4-6 days, then PRN q6h cough. What changed:    how much to take  how to take this  when to take this  reasons to take this  additional instructions   FIBER PO Take 1 tablet by mouth daily.   olopatadine 0.1 % ophthalmic solution Commonly known as:  PATANOL Place 1 drop into both eyes 2 (two) times daily.   prenatal multivitamin Tabs tablet Take 1 tablet by mouth daily at 12 noon.       Diet: routine diet  Activity: Advance as tolerated. Pelvic rest for 6 weeks.   Outpatient follow  up:6 weeks Follow up Appt:No future appointments. Follow up Visit:No follow-ups on file.  Postpartum contraception: Undecided  Newborn Data: Live born female  Birth Weight: 8 lb 5 oz (3770 g) APGAR: 9, 9  Newborn Delivery   Birth date/time:  12/12/2017 13:41:00 Delivery type:  Vaginal, Spontaneous     Baby Feeding: Breast Disposition:home with mother   12/14/2017 Cathrine Muster, DO

## 2017-12-14 NOTE — Progress Notes (Signed)
Patient ID: Alyssa Le, female   DOB: [birth year], 32 y.o.   MRN: [id number] Pt doing well with no complaints. Lochia mild and normal, pain controlled with ibuprofen, ambulating and tolerating diet. Denies CP or SOB. Feels ready for discharge to home. Breastfeeding well - baby just slightly jaundiced VSS ABD - FF and below umbilicus EXT - no edema  A/P: PPD#2 s/p svd with pp hemorrhage - stable         Discharge instructions reviewed         F/U 6 weeks

## 2017-12-14 NOTE — Plan of Care (Signed)
Pt. Condition will continue to improve 

## 2018-01-12 LAB — RESULTS CONSOLE HPV: CHL HPV: NEGATIVE

## 2018-01-12 LAB — HM PAP SMEAR: HM Pap smear: NORMAL

## 2018-01-26 ENCOUNTER — Encounter: Payer: Self-pay | Admitting: Gastroenterology

## 2018-01-26 DIAGNOSIS — Z3009 Encounter for other general counseling and advice on contraception: Secondary | ICD-10-CM | POA: Diagnosis not present

## 2018-01-26 DIAGNOSIS — Z1389 Encounter for screening for other disorder: Secondary | ICD-10-CM | POA: Diagnosis not present

## 2018-01-26 DIAGNOSIS — Z124 Encounter for screening for malignant neoplasm of cervix: Secondary | ICD-10-CM | POA: Diagnosis not present

## 2018-01-26 DIAGNOSIS — R12 Heartburn: Secondary | ICD-10-CM | POA: Insufficient documentation

## 2018-01-26 DIAGNOSIS — K219 Gastro-esophageal reflux disease without esophagitis: Secondary | ICD-10-CM | POA: Insufficient documentation

## 2018-02-09 IMAGING — DX DG TOE 4TH 2+V*L*
3 series · 3 of 3 positions shown · non-contrast
Comparison: Left fourth toe radiographs of September 01, 2016

CLINICAL DATA: Follow-up left fourth toe fracture. Symptoms are
improving.

EXAM:
LEFT FOURTH TOE

[toes dp]
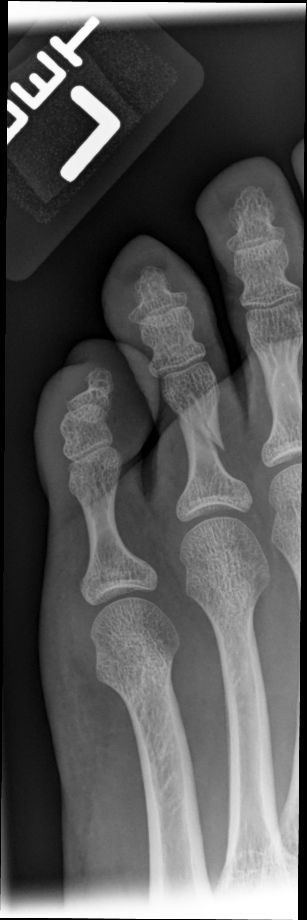

[toes oblique]
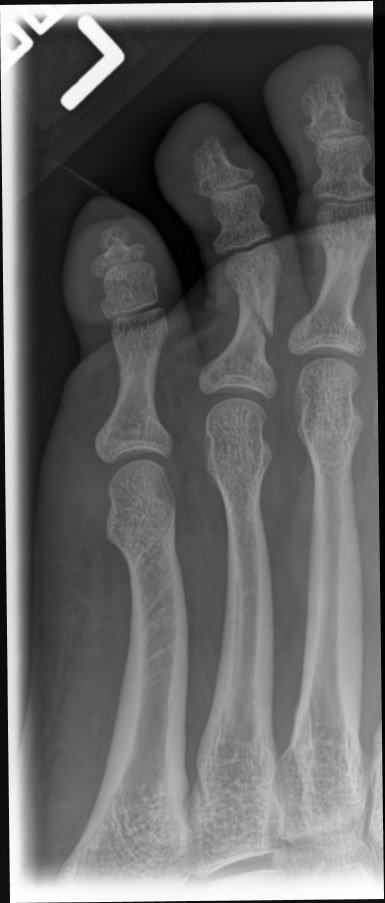

[toes lat]
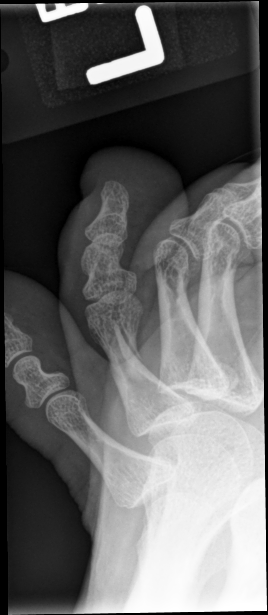

[3 of 3 positions shown; findings below may reference images not displayed]

FINDINGS: Again demonstrated is a mildly displaced spiral fracture of the mid
and distal shaft of the fourth proximal phalanx. There is mild
angulation at the fracture site which appears stable. No periosteal
reaction is observed.
IMPRESSION: No evidence of bony bridging of the spiral fracture of the shaft of
the proximal phalanx of the fourth toe. Mild angulation and
displacement persists.

## 2018-03-14 ENCOUNTER — Ambulatory Visit: Payer: BLUE CROSS/BLUE SHIELD | Admitting: Gastroenterology

## 2018-03-14 ENCOUNTER — Encounter

## 2018-03-20 ENCOUNTER — Ambulatory Visit (INDEPENDENT_AMBULATORY_CARE_PROVIDER_SITE_OTHER): Payer: BLUE CROSS/BLUE SHIELD

## 2018-03-20 DIAGNOSIS — Z23 Encounter for immunization: Secondary | ICD-10-CM

## 2018-09-06 ENCOUNTER — Other Ambulatory Visit: Payer: Self-pay

## 2018-09-06 ENCOUNTER — Ambulatory Visit (INDEPENDENT_AMBULATORY_CARE_PROVIDER_SITE_OTHER): Payer: BLUE CROSS/BLUE SHIELD | Admitting: Family Medicine

## 2018-09-06 ENCOUNTER — Encounter: Payer: Self-pay | Admitting: Family Medicine

## 2018-09-06 VITALS — Temp 99.2°F

## 2018-09-06 DIAGNOSIS — N61 Mastitis without abscess: Secondary | ICD-10-CM

## 2018-09-06 MED ORDER — SULFAMETHOXAZOLE-TRIMETHOPRIM 800-160 MG PO TABS: 1.0000 | ORAL_TABLET | Freq: Two times a day (BID) | ORAL | 0 refills | Status: DC

## 2018-09-06 NOTE — Patient Instructions (Signed)
Mastitis    Mastitis is irritation and swelling (inflammation) in an area of the breast. It is often caused by an infection that occurs when germs (bacteria) enter the skin. This most often happens to breastfeeding mothers, but it can happen to other women too as well as some men.  Follow these instructions at home:  Medicines  · Take over-the-counter and prescription medicines only as told by your doctor.  · If you were prescribed an antibiotic medicine, take it as told by your doctor. Do not stop taking it even if you start to feel better.  General instructions  · Do not wear a tight or underwire bra. Wear a soft support bra.  · Drink more fluids, especially if you have a fever.  · Get plenty of rest.  If you are breastfeeding:    · Keep emptying your breasts by breastfeeding or by using a breast pump.  · Keep your nipples clean and dry.  · During breastfeeding, empty the first breast before going to the other breast. Use a breast pump if your baby is not emptying your breasts.  · Massage your breasts during feeding or pumping as told by your doctor.  · If told, put moist heat on the affected area of your breast right before breastfeeding or pumping. Use the heat source that your doctor tells you to use.  · If told, put ice on the affected area of your breast right after breastfeeding or pumping:  ? Put ice in a plastic bag.  ? Place a towel between your skin and the bag.  ? Leave the ice on for 20 minutes.  · If you go back to work, pump your breasts while at work.  · Avoid letting your breasts get overly filled with milk (engorged).  Contact a doctor if:  · You have pus-like fluid leaking from your breast.  · You have a fever.  · Your symptoms do not get better within 2 days.  Get help right away if:  · Your pain and swelling are getting worse.  · Your pain is not helped by medicine.  · You have a red line going from your breast toward your armpit.  Summary  · Mastitis is irritation and swelling in an area of  the breast.  · If you were prescribed an antibiotic medicine, do not stop taking it even if you start to feel better.  · Drink more fluids and get plenty of rest.  · Contact a doctor if your symptoms do not get better within 2 days.  This information is not intended to replace advice given to you by your health care provider. Make sure you discuss any questions you have with your health care provider.  Document Released: 05/11/2009 Document Revised: 06/14/2016 Document Reviewed: 06/14/2016  Elsevier Interactive Patient Education © 2019 [employer]vier Inc[employer]

## 2018-09-06 NOTE — Progress Notes (Signed)
Virtual Visit via Video   I connected with Alyssa Le on 09/06/18 at  9:00 AM EDT by a video enabled telemedicine application and verified that I am speaking with the correct person using two identifiers. Location patient: Home Location provider: [facility], Office Persons participating in the virtual visit: Patient, Dr. Claiborne Billings and R.Baker, LPN  I discussed the limitations of evaluation and management by telemedicine and the availability of in person appointments. The patient expressed understanding and agreed to proceed.  Subjective:   Chief Complaint  Patient presents with  . Mastitutis    Daughter latched on wrong last week on L breast. Pt states it is red and tender. She is exhibiting some flu like symptoms, very run down and not feeling well.     HPI:  Alyssa Le is a 33 y.o. breast-feeding mother and 90-month old well daughter.  Reports last week her daughter latched incorrectly and caused pain.  She did not think much of it but did notice it started to get mildly red over the weekend.  Reports on Monday she started caring a low-grade fever of 99.2 F, had some body aches and was feeling more fatigued.  States the redness is located on the side of her breast near her nipple.  She denies any drainage or swelling.  There is tenderness to touch over the area.  She denies nausea or vomit.  ROS: See pertinent positives and negatives per HPI.  Patient Active Problem List   Diagnosis Date Noted  . Indication for care in labor or delivery 12/12/2017  . SVD (spontaneous vaginal delivery) 12/12/2017  . Postpartum care following vaginal delivery 12/12/2017  . Closed displaced fracture of proximal phalanx of lesser toe of left foot 09/13/2016  . UTI (urinary tract infection) 10/16/2015  . Diverticulosis of large intestine without hemorrhage 10/31/2014  . Diverticular disease of right side of colon 09/24/2014  . History of asthma 06/15/2013  . Multiple allergies 06/15/2013   . Preventative health care 06/14/2013  . Acquired arm deformity 06/14/2013    Social History   Tobacco Use  . Smoking status: Never Smoker  . Smokeless tobacco: Never Used  Substance Use Topics  . Alcohol use: No    Current Outpatient Medications:  .  albuterol (PROVENTIL HFA;VENTOLIN HFA) 108 (90 BASE) MCG/ACT inhaler, Use 2 puffs twice daily for 4-6 days, then PRN q6h cough. (Patient taking differently: Inhale 2 puffs into the lungs every 4 (four) hours as needed for shortness of breath. ), Disp: 1 Inhaler, Rfl: 0 .  FIBER PO, Take 1 tablet by mouth daily., Disp: , Rfl:  .  olopatadine (PATANOL) 0.1 % ophthalmic solution, Place 1 drop into both eyes 2 (two) times daily. (Patient not taking: Reported on 12/12/2017), Disp: 5 mL, Rfl: 5 .  Prenatal Vit-Fe Fumarate-FA (PRENATAL MULTIVITAMIN) TABS tablet, Take 1 tablet by mouth daily at 12 noon., Disp: , Rfl:   Allergies  Allergen Reactions  . Amoxicillin-Pot Clavulanate Shortness Of Breath and Other (See Comments)    Reaction:  Chest pain  - pt cannot tolerate Augmentin - but can take Amoxicillin  . Apple Anaphylaxis  . Other Anaphylaxis and Other (See Comments)    Pt states that she is allergic to fresh fruit and tree nuts.    . Ibuprofen Other (See Comments)    Causes ulcers  . Tree Extract   . Cephalexin Rash  . Latex Rash  . Perineal Cleansing [Balneol] Rash    Objective:  There  were no vitals taken for this visit. Gen: No acute distress. Nontoxic in appearance.  HENT: AT. [place].  MMM.  Eyes:  Conjunctiva without redness, discharge or icterus. Chest: Cough or shortness of breath not present Skin: no purpura or petechiae.  Neuro: Alert. Oriented x3  Psych: Normal affect, dress and demeanor. Normal speech. Normal thought content and judgment.   Assessment and Plan:  Mastitis -Did not visualize area, given it was a video visit and area was on her breast near nipple.  By description does sound like mastitis or at least a  secondary skin infection secondary to an improper latch.  Her daughter is 34-month-old and is healthy, normal pregnancy and gestation.  Patient is allergic to Keflex and penicillin products. - Advised patient to apply warm compresses a few times throughout the day.  -Bactrim twice daily for 7-10 days, if symptoms and areas completely healed at 5 days can go to 7 days, if any symptoms remain after 5 days treat for 10 days.  She reports understanding. -She was advised if symptoms are not improving with antibiotic use, to follow-up in 1 week or less.  If worsening she was given instructions on when to report emergently to an urgent care or emergency room.     Felix Pacini, DO 09/06/2018

## 2019-04-04 DIAGNOSIS — N939 Abnormal uterine and vaginal bleeding, unspecified: Secondary | ICD-10-CM | POA: Diagnosis not present

## 2019-04-29 ENCOUNTER — Other Ambulatory Visit: Payer: Self-pay

## 2019-04-29 ENCOUNTER — Encounter: Payer: Self-pay | Admitting: Family Medicine

## 2019-04-29 ENCOUNTER — Ambulatory Visit (INDEPENDENT_AMBULATORY_CARE_PROVIDER_SITE_OTHER): Payer: BC Managed Care – PPO | Admitting: Family Medicine

## 2019-04-29 VITALS — BP 105/70 | HR 72 | Temp 97.9°F | Resp 17 | Ht 66.0 in | Wt 137.2 lb

## 2019-04-29 DIAGNOSIS — R319 Hematuria, unspecified: Secondary | ICD-10-CM | POA: Diagnosis not present

## 2019-04-29 DIAGNOSIS — R300 Dysuria: Secondary | ICD-10-CM

## 2019-04-29 LAB — POCT URINALYSIS DIPSTICK
Bilirubin, UA: NEGATIVE
Glucose, UA: NEGATIVE
Ketones, UA: NEGATIVE
Leukocytes, UA: NEGATIVE
Nitrite, UA: NEGATIVE
Protein, UA: POSITIVE — AB
Spec Grav, UA: 1.025 (ref 1.010–1.025)
Urobilinogen, UA: 1 E.U./dL
pH, UA: 6 (ref 5.0–8.0)

## 2019-04-29 MED ORDER — SULFAMETHOXAZOLE-TRIMETHOPRIM 800-160 MG PO TABS: 1.0000 | ORAL_TABLET | Freq: Two times a day (BID) | ORAL | 0 refills | Status: DC

## 2019-04-29 NOTE — Progress Notes (Signed)
Alyssa Le , 06/26/1985, 33 y.o., female MRN: [id number] Patient Care Team    Relationship Specialty Notifications Start End  Natalia LeatherwoodKuneff,  A, DO PCP - General Family Medicine  04/27/16     Chief Complaint  Patient presents with  . Urinary Tract Infection    pain, burning with urination, frequency since Friday night. Pt started Azo but last dose was last night.      Subjective: Pt presents for an OV with complaints of dysuria of 3-4 days  duration.  She denies fever, chills, nausea or vomit.  Pt has tried AZo to ease their symptoms. She is breastfeeding.  Patient's last menstrual period was 04/20/2019 (exact date).  [facility] 02/15/2017  Decreased Interest 0  Down, Depressed, Hopeless 0  PHQ - 2 Score 0    Allergies  Allergen Reactions  . Amoxicillin-Pot Clavulanate Shortness Of Breath and Other (See Comments)    Reaction:  Chest pain  - pt cannot tolerate Augmentin - but can take Amoxicillin  . Apple Anaphylaxis  . Other Anaphylaxis and Other (See Comments)    Pt states that she is allergic to fresh fruit and tree nuts.    . Ibuprofen Other (See Comments)    Causes ulcers  . Tree Extract   . Cephalexin Rash  . Latex Rash  . Perineal Cleansing [Balneol] Rash   Social History   Social History Narrative   [place]. [place] She lives with her husband, 2 small children, and mother-in-law.   She works part-time.   Past Medical History:  Diagnosis Date  . Allergy    multiple food & environmental allergies (tree pollens & fruits)  . Arthritis    exercise & allergy induced  . Asthma    inhaler last used 2015  . Complication of anesthesia    post operative pulmonary edema  . Headache   . Hx of gastric ulcer   . Ovarian cyst, right    surgically removed. 33 y.o.   Past Surgical History:  Procedure Laterality Date  . APPENDECTOMY    . 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 OVARIAN CYST REMOVAL     stump appendectomy, Pulmonary edema post op   Family History  Adopted: Yes  Problem Relation Age of Onset  . Alcohol abuse Neg Hx   . Arthritis Neg Hx   . Asthma Neg Hx   . Birth defects Neg Hx   . Cancer Neg Hx   . COPD Neg Hx   . Depression Neg Hx   . Diabetes Neg Hx   . Drug abuse Neg Hx   . Early death Neg Hx   . Hearing loss Neg Hx   . Heart disease Neg Hx   . Hyperlipidemia Neg Hx   . Hypertension Neg Hx   . Kidney disease Neg Hx   . Learning disabilities Neg Hx   . Mental illness Neg Hx   . Mental retardation Neg Hx   . Miscarriages / Stillbirths Neg Hx   . Stroke Neg Hx   . Vision loss Neg Hx   . Varicose Veins Neg Hx    Allergies as of 04/29/2019      Reactions   Amoxicillin-pot Clavulanate Shortness Of Breath, Other (See Comments)   Reaction:  Chest pain  - pt cannot tolerate Augmentin - but can take Amoxicillin   Apple Anaphylaxis  Other Anaphylaxis, Other (See Comments)   Pt states that she is allergic to fresh fruit and tree nuts.     Ibuprofen Other (See Comments)   Causes ulcers   Tree Extract    Cephalexin Rash   Latex Rash   Perineal Cleansing [balneol] Rash      Medication List       Accurate as of April 29, 2019  4:24 PM. If you have any questions, ask your nurse or doctor.        STOP taking these medications   Mirena (52 MG) 20 MCG/24HR IUD Generic drug: levonorgestrel Stopped by: Felix Pacini, DO   olopatadine 0.1 % ophthalmic solution Commonly known as: Patanol Stopped by: Felix Pacini, DO   sulfamethoxazole-trimethoprim 800-160 MG tablet Commonly known as: Bactrim DS Stopped by: Felix Pacini, DO     TAKE these medications   albuterol 108 (90 Base) MCG/ACT inhaler Commonly known as: VENTOLIN HFA Use 2 puffs twice daily for 4-6 days, then PRN q6h cough. What changed:   how much to take  how to take this  when to take this  reasons to take this  additional instructions    EpiPen 2-Pak 0.3 mg/0.3 mL Soaj injection Generic drug: EPINEPHrine EpiPen 2-Pak 0.3 mg/0.3 mL injection, auto-injector       All past medical history, surgical history, allergies, family history, immunizations andmedications were updated in the EMR today and reviewed under the history and medication portions of their EMR.     ROS: Negative, with the exception of above mentioned in HPI   Objective:  BP 105/70 (BP Location: Left Arm, Patient Position: Sitting, Cuff Size: Normal)   Pulse 72   Temp 97.9 F (36.6 C) (Temporal)   Resp 17   Ht 5\' 6"  (1.676 m)   Wt 137 lb 4 oz (62.3 kg)   LMP 04/20/2019 (Exact Date)   SpO2 98%   Breastfeeding Yes   BMI 22.15 kg/m  Body mass index is 22.15 kg/m. Gen: Afebrile. No acute distress. Nontoxic in appearance, well developed, well nourished.  HENT: AT. [place].  Eyes:Pupils Equal Round Reactive to light, Extraocular movements intact,  Conjunctiva without redness, discharge or icterus. Abd: Soft. NTND. BS present. Neuro:  Normal gait. PERLA. EOMi. Alert. Oriented x3   No exam data present No results found. Results for orders placed or performed in visit on 04/29/19 (from the past 24 hour(s))  POCT Urinalysis Dipstick     Status: Abnormal   Collection Time: 04/29/19  4:18 PM  Result Value Ref Range   Color, UA yellow    Clarity, UA clear    Glucose, UA Negative Negative   Bilirubin, UA Negative    Ketones, UA Negative    Spec Grav, UA 1.025 1.010 - 1.025   Blood, UA 3+    pH, UA 6.0 5.0 - 8.0   Protein, UA Positive (A) Negative   Urobilinogen, UA 1.0 0.2 or 1.0 E.U./dL   Nitrite, UA Negative    Leukocytes, UA Negative Negative   Appearance     Odor Yes     Assessment/Plan: Alyssa Le is a 33 y.o. female present for OV for  Burning with urination/hematuria:  - sent for urine culture today.  - POCT Urinalysis Dipstick - Urine Culture - elect to tx w/ bactrim 3 day BID. Pt will be called with urine culture results.     Reviewed expectations re: course of current medical issues.  Discussed self-management of symptoms.  Outlined signs and symptoms  indicating need for more acute intervention.  Patient verbalized understanding and all questions were answered.  Patient received an After-Visit Summary.   > 25 minutes spent with patient, >50% of time spent face to face     Orders Placed This Encounter  Procedures  . Urine Culture  . POCT Urinalysis Dipstick    This visit occurred during the SARS-CoV-2 public health emergency.  Safety protocols were in place, including screening questions prior to the visit, additional usage of staff PPE, and extensive cleaning of exam room while observing appropriate contact time as indicated for disinfecting solutions.    Note is dictated utilizing voice recognition software. Although note has been proof read prior to signing, occasional typographical errors still can be missed. If any questions arise, please do not hesitate to call for verification.   electronically signed by:  Howard Pouch, DO  [place]

## 2019-04-29 NOTE — Progress Notes (Signed)
Birtt

## 2019-04-29 NOTE — Patient Instructions (Signed)
Start the bactrim every 12 hrs for 3 days. Hydrate.  We will call you once we get your urine culture results.

## 2019-05-01 LAB — URINE CULTURE
MICRO NUMBER:: 1130145
SPECIMEN QUALITY:: ADEQUATE

## 2019-05-08 DIAGNOSIS — Z6822 Body mass index (BMI) 22.0-22.9, adult: Secondary | ICD-10-CM | POA: Diagnosis not present

## 2019-05-08 DIAGNOSIS — Z1389 Encounter for screening for other disorder: Secondary | ICD-10-CM | POA: Diagnosis not present

## 2019-05-08 DIAGNOSIS — L659 Nonscarring hair loss, unspecified: Secondary | ICD-10-CM | POA: Diagnosis not present

## 2019-05-08 DIAGNOSIS — Z01419 Encounter for gynecological examination (general) (routine) without abnormal findings: Secondary | ICD-10-CM | POA: Diagnosis not present

## 2019-05-08 DIAGNOSIS — Z130 Encounter for screening for diseases of the blood and blood-forming organs and certain disorders involving the immune mechanism: Secondary | ICD-10-CM | POA: Diagnosis not present

## 2019-11-19 ENCOUNTER — Telehealth: Payer: Self-pay

## 2019-11-19 NOTE — Telephone Encounter (Signed)
Pt was called and told we could do VV at 4:15. She agreed. Amanda sent message to place on the schedule.

## 2019-11-19 NOTE — Telephone Encounter (Signed)
Patient called in stating she thinks she blew her vein and is nervous wants to be seen. Said its turned black and blue. Has swollen up also    Please call and advise

## 2019-11-19 NOTE — Telephone Encounter (Signed)
Pt was called and she said felt sharp pain at wrist when she picked up her daughter. Swollen and bruised. Happened at 0730 this morning and is feeling "a little better" now but husband said she needed to get checked because it could be a blood clot. Pt advised we would see about working her in today or with another MD. Pt can do VV if needed. She was advised to use ice, elevate, and take OTC counter pain meds if needed   Please advise if you would like to work her in.

## 2019-11-19 NOTE — Telephone Encounter (Signed)
Pt called back and said she did not want appt and would call back if she needed appt.

## 2019-12-02 DIAGNOSIS — Z3009 Encounter for other general counseling and advice on contraception: Secondary | ICD-10-CM | POA: Diagnosis not present

## 2019-12-02 DIAGNOSIS — Z3202 Encounter for pregnancy test, result negative: Secondary | ICD-10-CM | POA: Diagnosis not present

## 2020-02-25 DIAGNOSIS — N920 Excessive and frequent menstruation with regular cycle: Secondary | ICD-10-CM | POA: Diagnosis not present

## 2020-02-25 DIAGNOSIS — N939 Abnormal uterine and vaginal bleeding, unspecified: Secondary | ICD-10-CM | POA: Diagnosis not present

## 2020-02-25 DIAGNOSIS — R5383 Other fatigue: Secondary | ICD-10-CM | POA: Diagnosis not present

## 2020-02-25 DIAGNOSIS — L709 Acne, unspecified: Secondary | ICD-10-CM | POA: Diagnosis not present

## 2020-03-10 DIAGNOSIS — N939 Abnormal uterine and vaginal bleeding, unspecified: Secondary | ICD-10-CM | POA: Diagnosis not present

## 2020-03-23 DIAGNOSIS — R9389 Abnormal findings on diagnostic imaging of other specified body structures: Secondary | ICD-10-CM | POA: Diagnosis not present

## 2020-03-23 DIAGNOSIS — N920 Excessive and frequent menstruation with regular cycle: Secondary | ICD-10-CM | POA: Diagnosis not present

## 2020-06-29 ENCOUNTER — Telehealth (INDEPENDENT_AMBULATORY_CARE_PROVIDER_SITE_OTHER): Payer: BC Managed Care – PPO | Admitting: Family Medicine

## 2020-06-29 ENCOUNTER — Encounter: Payer: Self-pay | Admitting: Family Medicine

## 2020-06-29 DIAGNOSIS — U071 COVID-19: Secondary | ICD-10-CM

## 2020-06-29 MED ORDER — PREDNISONE 50 MG PO TABS: 50.0000 mg | ORAL_TABLET | Freq: Every day | ORAL | 0 refills | Status: DC

## 2020-06-29 MED ORDER — BENZONATATE 200 MG PO CAPS: 200.0000 mg | ORAL_CAPSULE | Freq: Two times a day (BID) | ORAL | 0 refills | Status: DC | PRN

## 2020-06-29 MED ORDER — DOXYCYCLINE HYCLATE 100 MG PO TABS
100.0000 mg | ORAL_TABLET | Freq: Two times a day (BID) | ORAL | 0 refills | Status: DC
Start: 2020-06-29 — End: 2020-09-09

## 2020-06-29 NOTE — Patient Instructions (Signed)

## 2020-06-29 NOTE — Progress Notes (Signed)
VIRTUAL VISIT VIA VIDEO  I connected with Alyssa Le on 06/29/20 at 11:30 AM EST by elemedicine application and verified that I am speaking with the correct person using two identifiers. Location patient: Home Location provider: [facility], Office Persons participating in the virtual visit: Patient, Dr. Claiborne Billings and T.Creft, CMA  I discussed the limitations of evaluation and management by telemedicine and the availability of in person appointments. The patient expressed understanding and agreed to proceed.   SUBJECTIVE Chief Complaint  Patient presents with  . URI    Sore throat, cough, congestion, some fever. X 2 days. Taking Tylenol.     HPI: Alyssa Le is a 35 y.o. female present for productive cough,headache,  congestion and fever Tmax 100F) of 2 days. She denies wheezing or shortness of breath. She is nauseated and having stomach cramps.  OTC: tylenol.  Vaccine:completed within last 6 mos.  Exposure: Family has COVID-husband, son and baby.  H/o asthma.    ROS: See pertinent positives and negatives per HPI.  Patient Active Problem List   Diagnosis Date Noted  . Closed displaced fracture of proximal phalanx of lesser toe of left foot 09/13/2016  . Diverticular disease of right side of colon 09/24/2014  . History of asthma 06/15/2013  . Multiple allergies 06/15/2013  . Acquired arm deformity 06/14/2013    Social History   Tobacco Use  . Smoking status: Never Smoker  . Smokeless tobacco: Never Used  Substance Use Topics  . Alcohol use: No    Current Outpatient Medications:  .  albuterol (PROVENTIL HFA;VENTOLIN HFA) 108 (90 BASE) MCG/ACT inhaler, Use 2 puffs twice daily for 4-6 days, then PRN q6h cough. (Patient taking differently: Inhale 2 puffs into the lungs every 4 (four) hours as needed for shortness of breath.), Disp: 1 Inhaler, Rfl: 0 .  benzonatate (TESSALON) 200 MG capsule, Take 1 capsule (200 mg total) by mouth 2 (two) times daily as needed for  cough., Disp: 20 capsule, Rfl: 0 .  doxycycline (VIBRA-TABS) 100 MG tablet, Take 1 tablet (100 mg total) by mouth 2 (two) times daily., Disp: 20 tablet, Rfl: 0 .  EPINEPHrine 0.3 mg/0.3 mL IJ SOAJ injection, EpiPen 2-Pak 0.3 mg/0.3 mL injection, auto-injector, Disp: , Rfl:  .  predniSONE (DELTASONE) 50 MG tablet, Take 1 tablet (50 mg total) by mouth daily with breakfast., Disp: 5 tablet, Rfl: 0  Allergies  Allergen Reactions  . Amoxicillin-Pot Clavulanate Shortness Of Breath and Other (See Comments)    Reaction:  Chest pain  - pt cannot tolerate Augmentin - but can take Amoxicillin  . Apple Anaphylaxis  . Other Anaphylaxis and Other (See Comments)    Pt states that she is allergic to fresh fruit and tree nuts.    . Ibuprofen Other (See Comments)    Causes ulcers  . Tree Extract   . Cephalexin Rash  . Latex Rash  . Perineal Cleansing [Balneol] Rash    OBJECTIVE: There were no vitals taken for this visit. Gen: No acute distress. Nontoxic in appearance.  HENT: AT. [place].  MMM.  Eyes:Pupils Equal Round Reactive to light, Extraocular movements intact,  Conjunctiva without redness, discharge or icterus. Chest: Cough present- no shortness of breath Skin: no rashes, purpura or petechiae.  Neuro: . Alert. Oriented x3  Psych: Normal affect and demeanor. Normal speech. Normal thought content and judgment.  ASSESSMENT AND PLAN: Alyssa Le is a 35 y.o. female present for  COVID-19/asthma history Rest, hydrate.  Patient is at  high risk of complications with her asthma history.  Elected to go ahead and treat with Doxy and prednisone.  mucinex (DM if cough), nettie pot or nasal saline.  Doxycycline and prednisone prescribed, take until completed.  Tessalon Perles for cough OTC Chloraseptic Spray for sore throat If cough present it can last up to 6-8 weeks.  F/U 2 weeks of not improved.     Felix Pacini, DO 06/29/2020   Return in about 2 weeks (around 07/13/2020), or if symptoms worsen or  fail to improve.  No orders of the defined types were placed in this encounter.  Meds ordered this encounter  Medications  . doxycycline (VIBRA-TABS) 100 MG tablet    Sig: Take 1 tablet (100 mg total) by mouth 2 (two) times daily.    Dispense:  20 tablet    Refill:  0  . benzonatate (TESSALON) 200 MG capsule    Sig: Take 1 capsule (200 mg total) by mouth 2 (two) times daily as needed for cough.    Dispense:  20 capsule    Refill:  0  . predniSONE (DELTASONE) 50 MG tablet    Sig: Take 1 tablet (50 mg total) by mouth daily with breakfast.    Dispense:  5 tablet    Refill:  0   Referral Orders  No referral(s) requested today

## 2020-09-02 ENCOUNTER — Ambulatory Visit (INDEPENDENT_AMBULATORY_CARE_PROVIDER_SITE_OTHER): Payer: BC Managed Care – PPO | Admitting: Family Medicine

## 2020-09-02 ENCOUNTER — Emergency Department (HOSPITAL_BASED_OUTPATIENT_CLINIC_OR_DEPARTMENT_OTHER)
Admission: EM | Admit: 2020-09-02 | Discharge: 2020-09-02 | Disposition: A | Discharge status: Home / Self Care | Payer: BC Managed Care – PPO | Attending: Emergency Medicine | Admitting: Emergency Medicine

## 2020-09-02 ENCOUNTER — Emergency Department (HOSPITAL_BASED_OUTPATIENT_CLINIC_OR_DEPARTMENT_OTHER): Payer: BC Managed Care – PPO | Admitting: Radiology

## 2020-09-02 ENCOUNTER — Other Ambulatory Visit: Payer: Self-pay

## 2020-09-02 VITALS — BP 110/78 | HR 89 | Ht 66.0 in | Wt 144.2 lb

## 2020-09-02 DIAGNOSIS — O4440 Low lying placenta NOS or without hemorrhage, unspecified trimester: Secondary | ICD-10-CM | POA: Insufficient documentation

## 2020-09-02 DIAGNOSIS — S92354A Nondisplaced fracture of fifth metatarsal bone, right foot, initial encounter for closed fracture: Secondary | ICD-10-CM

## 2020-09-02 DIAGNOSIS — J302 Other seasonal allergic rhinitis: Secondary | ICD-10-CM | POA: Insufficient documentation

## 2020-09-02 DIAGNOSIS — J45909 Unspecified asthma, uncomplicated: Secondary | ICD-10-CM | POA: Diagnosis not present

## 2020-09-02 DIAGNOSIS — Z91040 Latex allergy status: Secondary | ICD-10-CM | POA: Insufficient documentation

## 2020-09-02 DIAGNOSIS — M79671 Pain in right foot: Secondary | ICD-10-CM | POA: Diagnosis not present

## 2020-09-02 DIAGNOSIS — Y92009 Unspecified place in unspecified non-institutional (private) residence as the place of occurrence of the external cause: Secondary | ICD-10-CM | POA: Insufficient documentation

## 2020-09-02 DIAGNOSIS — X58XXXA Exposure to other specified factors, initial encounter: Secondary | ICD-10-CM | POA: Insufficient documentation

## 2020-09-02 DIAGNOSIS — Y9301 Activity, walking, marching and hiking: Secondary | ICD-10-CM | POA: Diagnosis not present

## 2020-09-02 NOTE — Discharge Instructions (Signed)
Take 4 over the counter ibuprofen tablets 3 times a day or 2 over-the-counter naproxen tablets twice a day for pain. Also take tylenol 1000mg(2 extra strength) four times a day.    

## 2020-09-02 NOTE — Progress Notes (Signed)
   I, Philbert Riser, LAT, ATC acting as a scribe for Clementeen Graham, MD.  Subjective:    CC: Right foot pain  HPI: Pt is a 35 y/o female c/o R foot pain ongoing since this morning, 3/30. Pt was seen today at the [facility] ED earlier today w/ these complaints. MOI: Pt was walking down the stairs this morning to get ready to take her children to school when she stepped on a dog bone that was just underneath the bottom step. Pt rolled ankle into INV and hear something "pop." Pt locates pain to along distal portion of 4th-5th MT.    Patient was given a postop shoe.  She notes pain with the postop shoe with ambulation.  Foot swelling: yes  Dx imaging: 09/02/20 R foot XR  Pertinent review of Systems: No fevers or chills  Relevant historical information: Arm deformity requiring arm bone lengthening procedure.   Objective:    Vitals:   09/02/20 1424  BP: 110/78  Pulse: 89  SpO2: 98%   General: Well Developed, well nourished, and in no acute distress.   MSK: Right foot and ankle. Right ankle normal-appearing normal ankle motion nontender. Right foot swollen at lateral midfoot.  Tender palpation proximal fifth metatarsal. Pain with resisted foot eversion. Pulses capillary refill and sensation are intact distally.  Lab and Radiology Results No results found for this or any previous visit (from the past 72 hour(s)). DG Foot Complete Right  Result Date: 09/02/2020 CLINICAL DATA:  Pain following rolling injury EXAM: RIGHT FOOT COMPLETE - 3+ VIEW COMPARISON:  None FINDINGS: Frontal, oblique, and lateral views were obtained. On the lateral view, there is a subtle lucency in the proximal fifth metatarsal which is not confirmed on other views. This area is concerning for incomplete fracture arising from the proximal most aspect of the fifth metatarsal. No other evident fracture. No dislocation. No appreciable joint space narrowing or erosion. IMPRESSION: Subtle lucency in the base  of the fifth metatarsal seen only on the lateral view, concerning for subtle incomplete fracture in this area. No other evident potential fracture. No dislocation. No appreciable arthropathy. Electronically Signed   By: Bretta Bang III M.D.   On: 09/02/2020 10:14   I, Clementeen Graham, personally (independently) visualized and performed the interpretation of the images attached in this note.     Impression and Recommendations:    Assessment and Plan: 35 y.o. female with proximal fifth metatarsal fracture very likely.  Patient felt and heard a pop when she her foot went into inversion and now has pain and swelling at the proximal fifth metatarsal.  She does have a probable fracture barely visible on x-ray.   Plan to use cam walker boot which should be more supportive and recheck in about 1 week.  Tylenol and NSAIDs will be helpful..   Discussed warning signs or symptoms. Please see discharge instructions. Patient expresses understanding.   The above documentation has been reviewed and is accurate and complete Clementeen Graham, M.D.

## 2020-09-02 NOTE — Patient Instructions (Addendum)
Thank you for coming in today.  Plan for Cam Walker boot.   Recheck in about 1 week.   Take it easy.   Ibuprofen and tylenol will help.   Ok to use a crutch with the boot if needed.   Vit D 2000 units daily.   Please go to [facility] supply to get the cam walker we talked about today. You may also be able to get it from [employer].    Avulsion Fracture of the Foot  An avulsion fracture of the foot is a tearing away of a piece of bone in the foot. Bones are connected to other bones with strong bands of tissue (ligaments). Muscles are also connected to bones with strong bands of tissue (tendons). Avulsion fractures occur when severe stress on a ligament or tendon causes a small piece of bone to be pulled away from the main portion of the bone. This is typically caused by trauma or injury. Athletes may develop an avulsion fracture of the foot gradually (chronic avulsion fracture). Common locations of avulsion fracture of the foot are the heel bone and the long bone that connects to the fifth toe (fifth metatarsal bone). What are the causes? This condition may be caused by:  A sudden or repetitive twisting or rolling of your foot or ankle.  A fall. What increases the risk? You are more likely to develop this condition if:  You participate in activities where twisting your ankle or foot are more likely to occur, such as: ? Dancing. ? Track and field. ? Walking on uneven surfaces.  You have had diabetes for many years.  You have weak bones (osteoporosis).  You are female and older than age 30. What are the signs or symptoms? The main symptom of this condition is intense pain at the time of injury. You may have a feeling like a pop, a snap, or a tearing in your foot. Other signs and symptoms may include:  Swelling in the heel area or outer part of the foot.  Bruising in the heel, ankle, or outer part of the foot.  The injured area feeling warm to the touch.  Pain with movement  or when you use your foot to support your body weight (bear weight).  Pain when pressure is applied to the injured area.  Difficulty walking. How is this diagnosed? An avulsion fracture is usually diagnosed with a physical exam and imaging tests, such as:  X-ray. This will show if any bones are fractured or out of place (displaced).  MRI. This will show your ligaments and tendons.  CT scan. This will show a more detailed image of your injury than an X-ray. How is this treated? Treatment for this condition depends on the size of the torn piece of bone and how far it has been displaced.  Treatments for small avulsion fractures may include: ? Rest, ice, pressure (compression), and raising (elevating) your injured foot (RICE therapy). ? Preventing any movement (immobilization) of the injured foot for up to 6 weeks. This may be done by wearing a boot, a stiff-soled shoe, or a cast. Crutches may also be used to help bear weight until your foot heals.  Treatment for large avulsion fractures may include: ? Surgery to reattach the bone to the ligament or the tendon. ? Crutches or a rolling scooter to help bear weight until your foot heals.  Other treatments may be recommended, including: ? Physical therapy to help regain full use of your foot. This may last  for several months. ? Medicines that reduce pain and swelling (NSAIDs). Follow these instructions at home: If you have a boot or a stiff-soled shoe:  Wear the boot or shoe as told by your health care provider. Remove it only as told by your health care provider.  Loosen it if your toes tingle, become numb, or turn cold and blue.  Keep it clean.  If the boot or shoe is not waterproof: ? Do not let it get wet. ? Cover it with a watertight covering when you take a bath or shower. If you have a cast:  Do not put pressure on any part of the cast until it is fully hardened. This may take several hours.  Do not stick anything inside the  cast to scratch your skin. Doing that increases your risk of infection.  Check the skin around the cast every day. Tell your health care provider about any concerns.  You may put lotion on dry skin around the edges of the cast. Do not put lotion on the skin underneath the cast.  Keep it clean.  If the cast is not waterproof: ? Do not let it get wet. ? Cover it with a watertight covering when you take a bath or shower. Managing pain, stiffness, and swelling  If directed, put ice on the injured area. To do this: ? If you have a removable boot or stiff-soled shoe, remove it as told by your health care provider. ? Put ice in a plastic bag. ? Place a towel between your skin and the bag or between your cast and the bag. ? Leave the ice on for 20 minutes, 2-3 times a day. ? Remove the ice if your skin turns bright red. This is very important. If you cannot feel pain, heat, or cold, you have a greater risk of damage to the area.  Move your toes often to reduce stiffness and swelling.  Elevate the injured area above the level of your heart while you are sitting or lying down.   Activity  Return to your normal activities as told by your health care provider. Ask your health care provider what activities are safe for you.  Use crutches as told by your health care provider.  Ask your health care provider when it is safe to drive if you have a cast, boot, or stiff-soled shoe on your foot. General instructions  Take over-the-counter and prescription medicines only as told by your health care provider.  Do not use any products that contain nicotine or tobacco, such as cigarettes, e-cigarettes, and chewing tobacco. These can delay bone healing. If you need help quitting, ask your health care provider.  Keep all follow-up visits. This is important. Contact a health care provider if:  Your pain gets worse.  You have chills or a fever.  You have any of these problems with your cast: ? It is  damaged. ? It has a bad odor or stains caused by fluids from your foot wound. ? It feels like your cast is too tight. Get help right away if:  Your foot is cold, blue, or pale.  You have pain, swelling, redness, or numbness under your cast.  You have numbness or tingling in your foot.  You have increased pain in your foot or have pain that is not relieved by pain medicine.  You cannot move your toes or foot. Summary  Avulsion fractures occur when severe stress on a ligament or tendon causes a small piece of bone  to be pulled away from the main portion of the bone.  This condition may be caused by a fall or by a sudden or repetitive twisting or rolling of your foot or ankle.  There are surgical and nonsurgical treatment options. You and your health care provider will discuss the options that are best for you.  Keep all follow-up visits. This is important. This information is not intended to replace advice given to you by your health care provider. Make sure you discuss any questions you have with your health care provider. Document Revised: 09/10/2019 Document Reviewed: 09/10/2019 Elsevier Patient Education  2021 [employer].

## 2020-09-02 NOTE — ED Triage Notes (Signed)
Pt stepped on her dog's bone and injured the anterior right half of her right foot.

## 2020-09-02 NOTE — ED Provider Notes (Signed)
[facility] EMERGENCY DEPT Provider Note   CSN: [id number] Arrival date & time: 09/02/20  2992     History Chief Complaint  Patient presents with  . Ankle Pain    Alyssa Le is a 35 y.o. female.  35 yo F with a chief complaint of right foot pain.  Patient was walking down the stairs this morning to get ready to take her children to school when she stepped on a dog bone that was just underneath the bottom step.  Has pain down to the lateral aspect of the foot and pain with bearing weight.  Denies any pain to the ankle.  Denies other injury.  The history is provided by the patient and the spouse.  Ankle Pain Associated symptoms: no fever   Foot Pain This is a new problem. The current episode started 1 to 2 hours ago. The problem occurs constantly. The problem has not changed since onset.Pertinent negatives include no chest pain, no headaches and no shortness of breath. The symptoms are aggravated by bending, twisting and walking. Nothing relieves the symptoms. She has tried nothing for the symptoms. The treatment provided no relief.       Past Medical History:  Diagnosis Date  . Allergy    multiple food & environmental allergies (tree pollens & fruits)  . Arthritis    exercise & allergy induced  . Asthma    inhaler last used 2015  . Complication of anesthesia    post operative pulmonary edema  . Headache   . Hx of gastric ulcer   . Kidney stone   . Ovarian cyst, right    surgically removed. 35 y.o.    Patient Active Problem List   Diagnosis Date Noted  . Closed displaced fracture of proximal phalanx of lesser toe of left foot 09/13/2016  . Diverticular disease of right side of colon 09/24/2014  . History of asthma 06/15/2013  . Multiple allergies 06/15/2013  . Acquired arm deformity 06/14/2013    Past Surgical History:  Procedure Laterality Date  . APPENDECTOMY    . 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 OVARIAN CYST REMOVAL     stump appendectomy, Pulmonary edema post op     OB History    Gravida  4   Para  4   Term  4   Preterm      AB      Living  4     SAB      IAB      Ectopic      Multiple  0   Live Births  4           Family History  Adopted: Yes  Problem Relation Age of Onset  . Alcohol abuse Neg Hx   . Arthritis Neg Hx   . Asthma Neg Hx   . Birth defects Neg Hx   . Cancer Neg Hx   . COPD Neg Hx   . Depression Neg Hx   . Diabetes Neg Hx   . Drug abuse Neg Hx   . Early death Neg Hx   . Hearing loss Neg Hx   . Heart disease Neg Hx   . Hyperlipidemia Neg Hx   . Hypertension Neg Hx   . Kidney disease Neg Hx   . Learning disabilities Neg Hx   . Mental illness Neg Hx   . Mental retardation Neg Hx   .  Miscarriages / Stillbirths Neg Hx   . Stroke Neg Hx   . Vision loss Neg Hx   . Varicose Veins Neg Hx     Social History   Tobacco Use  . Smoking status: Never Smoker  . Smokeless tobacco: Never Used  Vaping Use  . Vaping Use: Never used  Substance Use Topics  . Alcohol use: No  . Drug use: No    Home Medications Prior to Admission medications   Medication Sig Start Date End Date Taking? Authorizing Provider  albuterol (PROVENTIL HFA;VENTOLIN HFA) 108 (90 BASE) MCG/ACT inhaler Use 2 puffs twice daily for 4-6 days, then PRN q6h cough. Patient taking differently: Inhale 2 puffs into the lungs every 4 (four) hours as needed for shortness of breath. 06/28/13   Maximino Sarin C, NP  benzonatate (TESSALON) 200 MG capsule Take 1 capsule (200 mg total) by mouth 2 (two) times daily as needed for cough. 06/29/20   Kuneff, Renee A, DO  doxycycline (VIBRA-TABS) 100 MG tablet Take 1 tablet (100 mg total) by mouth 2 (two) times daily. 06/29/20   Kuneff, Renee A, DO  EPINEPHrine 0.3 mg/0.3 mL IJ SOAJ injection EpiPen 2-Pak 0.3 mg/0.3 mL injection, auto-injector    [provider]  predniSONE (DELTASONE) 50 MG tablet Take 1  tablet (50 mg total) by mouth daily with breakfast. 06/29/20   Kuneff, Renee A, DO    Allergies    Amoxicillin-pot clavulanate, Apple, Other, Ibuprofen, Tree extract, Cephalexin, Latex, and Perineal cleansing [balneol]  Review of Systems   Review of Systems  Constitutional: Negative for chills and fever.  HENT: Negative for congestion and rhinorrhea.   Eyes: Negative for redness and visual disturbance.  Respiratory: Negative for shortness of breath and wheezing.   Cardiovascular: Negative for chest pain and palpitations.  Gastrointestinal: Negative for nausea and vomiting.  Genitourinary: Negative for dysuria and urgency.  Musculoskeletal: Positive for arthralgias. Negative for myalgias.  Skin: Negative for pallor and wound.  Neurological: Negative for dizziness and headaches.    Physical Exam Updated Vital Signs BP 109/69 (BP Location: Left Arm)   Pulse 76   Temp 98.1 F (36.7 C) (Oral)   Resp 17   Ht 5\' 6"  (1.676 m)   Wt 64.4 kg   SpO2 99%   BMI 22.92 kg/m   Physical Exam Vitals and nursing note reviewed.  Constitutional:      General: She is not in acute distress.    Appearance: She is well-developed. She is not diaphoretic.  HENT:     Head: Normocephalic and atraumatic.  Eyes:     Pupils: Pupils are equal, round, and reactive to light.  Cardiovascular:     Rate and Rhythm: Normal rate and regular rhythm.  Pulmonary:     Effort: Pulmonary effort is normal.  Musculoskeletal:        General: Tenderness present.     Cervical back: Normal range of motion and neck supple.     Comments: Pain and swelling to the base of the fifth metatarsal on the right.  Also pain at the attachment of the ATF to the foot.  Pulse motor and sensation are intact.  Full range of motion of the ankle.  No pain or swelling along the lateral or medial malleolus.  Skin:    General: Skin is warm and dry.  Neurological:     Mental Status: She is alert and oriented to person, place, and time.   Psychiatric:        Behavior:  Behavior normal.     ED Results / Procedures / Treatments   Labs (all labs ordered are listed, but only abnormal results are displayed) Labs Reviewed - No data to display  EKG None  Radiology DG Foot Complete Right  Result Date: 09/02/2020 CLINICAL DATA:  Pain following rolling injury EXAM: RIGHT FOOT COMPLETE - 3+ VIEW COMPARISON:  None FINDINGS: Frontal, oblique, and lateral views were obtained. On the lateral view, there is a subtle lucency in the proximal fifth metatarsal which is not confirmed on other views. This area is concerning for incomplete fracture arising from the proximal most aspect of the fifth metatarsal. No other evident fracture. No dislocation. No appreciable joint space narrowing or erosion. IMPRESSION: Subtle lucency in the base of the fifth metatarsal seen only on the lateral view, concerning for subtle incomplete fracture in this area. No other evident potential fracture. No dislocation. No appreciable arthropathy. Electronically Signed   By: Bretta Bang III M.D.   On: 09/02/2020 10:14    Procedures Procedures   Medications Ordered in ED Medications - No data to display  ED Course  I have reviewed the triage vital signs and the nursing notes.  Pertinent labs & imaging results that were available during my care of the patient were reviewed by me and considered in my medical decision making (see chart for details).    MDM Rules/Calculators/A&P                          35 yo F with a chief complaints of right foot pain.  This occurred after stepping on a dog bone at home.  Will obtain a plain film.  Plain film reviewed by me with a slight disruption only seen on the lateral film of the base of the fifth metatarsal.  Corresponds with the area of the patient's pain.  Likely a small fracture versus avulsion injury.  Placed in a postop shoe.  PCP follow-up.  10:26 AM:  I have discussed the diagnosis/risks/treatment options  with the patient and family and believe the pt to be eligible for discharge home to follow-up with PCP. We also discussed returning to the ED immediately if new or worsening sx occur. We discussed the sx which are most concerning (e.g., sudden worsening pain, fever, inability to tolerate by mouth) that necessitate immediate return. Medications administered to the patient during their visit and any new prescriptions provided to the patient are listed below.  Medications given during this visit Medications - No data to display   The patient appears reasonably screen and/or stabilized for discharge and I doubt any other medical condition or other [facility] requiring further screening, evaluation, or treatment in the ED at this time prior to discharge.   Final Clinical Impression(s) / ED Diagnoses Final diagnoses:  Foot pain, right    Rx / DC Orders ED Discharge Orders    None       Melene Plan, DO 09/02/20 1026

## 2020-09-03 ENCOUNTER — Ambulatory Visit: Payer: BC Managed Care – PPO | Admitting: Family Medicine

## 2020-09-03 DIAGNOSIS — S92354A Nondisplaced fracture of fifth metatarsal bone, right foot, initial encounter for closed fracture: Secondary | ICD-10-CM | POA: Insufficient documentation

## 2020-09-08 NOTE — Progress Notes (Signed)
I, Christoper Fabian, LAT, ATC, am serving as scribe for Dr. Clementeen Graham.  Alyssa Le is a 35 y.o. female who presents to [facility] at [facility] today for f/u of a possible R 5th MT fx that occurred on 09/02/20 when she stepped onto a dog bone and rolled her foot and ankle into inversion.  She was last seen by Dr. Denyse Amass on advised to use a CAM walker boot and Tylenol and IBU for pain.  Since her last visit, pt reports that her R foot is feeling better but notes that it feels "weird."  She reports mainly discomfort in her foot at this point when out of the boot but not so much pain.  She states that her bruising is decreasing and is now more of a yellowish color.  Diagnostic imaging: R foot XR- 09/02/20  Pertinent review of systems: No fevers or chills  Relevant historical information: Diverticulitis history.  Acquired arm deformity requiring arm lengthening procedure   Exam:  BP 102/78 (BP Location: Right Arm, Patient Position: Sitting, Cuff Size: Normal)   Pulse 87   Ht 5\' 6"  (1.676 m)   Wt 145 lb (65.8 kg)   LMP 08/25/2020   SpO2 97%   BMI 23.40 kg/m  General: Well Developed, well nourished, and in no acute distress.   MSK: Right foot bruising present dorsal midfoot. Tender palpation dorsal lateral midfoot.  Not particularly tender to palpation at proximal fifth metatarsal any longer. Foot motion decreased to dorsiflexion otherwise normal. Pain with resisted foot dorsiflexion and eversion and plantarflexion and eversion. Pulses capillary refill and sensation are intact distally.    Lab and Radiology Results  X-ray images right foot obtained today personally and independently interpreted. No acute fractures are visible. Await formal radiology review  Diagnostic Limited MSK Ultrasound of: Right foot Dorsal lateral midfoot area of tenderness reveals a hyperechoic change overlying cuboid bone consistent with avulsion fracture or healing nondisplaced  fracture. The proximal fifth metatarsal is normal-appearing with normal-appearing insertion site of the peroneal brevis tendon. Impression: Likely cuboid fracture nondisplaced.    Assessment and Plan: 35 y.o. female with right foot pain thought to be cuboid fracture.  This is often very difficult to see on x-ray.  Fortunately patient is improving with the appropriate treatment of CAM Walker boot.  Plan to [place] you cam walker boot and weightbearing as tolerated and recheck in 2 weeks.  Likely will plan on transitioning to postop shoe and ASO brace in about 2 weeks if feeling well.   PDMP not reviewed this encounter. Orders Placed This Encounter  Procedures  . DG Foot Complete Right    Standing Status:   Future    Number of Occurrences:   1    Standing Expiration Date:   10/09/2020    Order Specific Question:   Reason for Exam (SYMPTOM  OR DIAGNOSIS REQUIRED)    Answer:   R foot pain    Order Specific Question:   Is patient pregnant?    Answer:   No    Order Specific Question:   Preferred imaging location?    Answer:   12/09/2020  . Kyra Searles LIMITED JOINT SPACE STRUCTURES LOW RIGHT(NO LINKED CHARGES)    Order Specific Question:   Reason for Exam (SYMPTOM  OR DIAGNOSIS REQUIRED)    Answer:   R foot pain    Order Specific Question:   Preferred imaging location?    Answer:   [facility] [facility]  No orders of the defined types were placed in this encounter.    Discussed warning signs or symptoms. Please see discharge instructions. Patient expresses understanding.   The above documentation has been reviewed and is accurate and complete Lynne Leader, M.D.

## 2020-09-09 ENCOUNTER — Other Ambulatory Visit: Payer: Self-pay

## 2020-09-09 ENCOUNTER — Ambulatory Visit (INDEPENDENT_AMBULATORY_CARE_PROVIDER_SITE_OTHER): Payer: BC Managed Care – PPO

## 2020-09-09 ENCOUNTER — Ambulatory Visit (INDEPENDENT_AMBULATORY_CARE_PROVIDER_SITE_OTHER): Payer: BC Managed Care – PPO | Admitting: Family Medicine

## 2020-09-09 ENCOUNTER — Ambulatory Visit: Payer: Self-pay

## 2020-09-09 ENCOUNTER — Encounter: Payer: Self-pay | Admitting: Family Medicine

## 2020-09-09 VITALS — BP 102/78 | HR 87 | Ht 66.0 in | Wt 145.0 lb

## 2020-09-09 DIAGNOSIS — M79671 Pain in right foot: Secondary | ICD-10-CM | POA: Diagnosis not present

## 2020-09-09 DIAGNOSIS — S92354A Nondisplaced fracture of fifth metatarsal bone, right foot, initial encounter for closed fracture: Secondary | ICD-10-CM

## 2020-09-09 DIAGNOSIS — S92351D Displaced fracture of fifth metatarsal bone, right foot, subsequent encounter for fracture with routine healing: Secondary | ICD-10-CM | POA: Diagnosis not present

## 2020-09-09 DIAGNOSIS — S92214A Nondisplaced fracture of cuboid bone of right foot, initial encounter for closed fracture: Secondary | ICD-10-CM

## 2020-09-09 NOTE — Patient Instructions (Signed)
Thank you for coming in today.  Continue the boot.    We will transition to ASO ankle brace + post op shoe soon.   Recheck in 2 weeks.   Return sooner if needed.

## 2020-09-10 NOTE — Progress Notes (Signed)
Radiology cannot see a fracture on x-ray.  This is not a surprise as I am concerned about a fracture at the cuboid bone in the foot which is not visible on x-ray.  If needed we will see this better with MRI.

## 2020-09-11 ENCOUNTER — Telehealth: Payer: Self-pay | Admitting: Family Medicine

## 2020-09-11 NOTE — Telephone Encounter (Signed)
Patient called asking if it is "normal" to have a burning sensation near her injury? She said that it comes and goes but sometimes it will occur when she is resting or not moving her foot. Please advise.

## 2020-09-14 NOTE — Telephone Encounter (Signed)
Pt call

## 2020-09-14 NOTE — Telephone Encounter (Signed)
Burning is not a typical symptom. I think it is possible that one of the small nerves is being irritated by the injury it self or the boot.    If it does not get worse I think you are ok. I am happy to see you sooner if needed.

## 2020-09-14 NOTE — Telephone Encounter (Signed)
Spoke w/ pt and provided Dr. Zollie Pee recommendation. Pt reports "burning" sensation improved somewhat over the weekend and the sensation comes and goes. Pt was instructed if this becomes worse to let us know and we can get her seen sooner than her already scheduled f/u visit.

## 2020-09-22 ENCOUNTER — Telehealth: Payer: Self-pay

## 2020-09-22 NOTE — Telephone Encounter (Signed)
Spoke with pt and scheduled to come in office. Pt states that her asthma has been controlled but is now having to use her inhaler at least twice a day. She is beginning to have eczema issues on her hands and eyes. Pt is not with an allergy and asthma clinic at the moment.   [facility] Day - Client TELEPHONE ADVICE RECORD AccessNurse Patient Name: KAMBRIA GRIMA [facility] Gender: Female DOB: 1985-06-15 Age: 35 Y 2 M 6 D Return Phone Number: [phone] (Primary) Address: City/ State/ Zip: [place]  [address] Client [facility] Day - Client Client Site  [facility] Day Physician [place], [place] Contact Type Call Who Is Calling Patient / Member / Family / Caregiver Call Type Triage / Clinical Relationship To Patient Self Return Phone Number [phone] (Primary) Chief Complaint BREATHING - shortness of breath or sounds breathless Reason for Call Symptomatic / Request for Health Information Initial Comment Caller states she always has really bad allergy symptoms. She is now having trouble with her asthma and her eyes are itching. She can't take a deep breath without coughing and is having to use her rescue inhaler. Translation No Nurse Assessment Nurse: Micheline Maze, RN, Clydie Braun Date/Time Lamount Cohen Time): 09/18/2020 12:30:46 PM Confirm and document reason for call. If symptomatic, describe symptoms. ---Caller states that she is having some breathing issues causes her breathing to be bad and coughing - having a lot of itching and crusty eyes - had a bad allergy attack earlier Does the patient have any new or worsening symptoms? ---Yes Will a triage be completed? ---Yes Related visit to physician within the last 2 weeks? ---Yes Does the PT have any chronic conditions? (i.e. diabetes, asthma, this includes High risk factors for pregnancy, etc.) ---Yes List chronic conditions. ---Asthma, Allergies Is the patient pregnant or  possibly pregnant? (Ask all females between the ages of 56-55) ---No Is this a behavioral health or substance abuse call? ---No Guidelines Guideline Title Affirmed Question Affirmed Notes Nurse Date/Time (Eastern Time) Asthma Attack [1] MODERATE asthma attack (e.g., SOB at rest, speaks in phrases, audible [place]wood Dense 09/18/2020 12:33:24 PM PLEASE NOTE: All timestamps contained within this report are represented as [place] Standard Time. CONFIDENTIALTY NOTICE: This fax transmission is intended only for the addressee. It contains information that is legally privileged, confidential or otherwise protected from use or disclosure. If you are not the intended recipient, you are strictly prohibited from reviewing, disclosing, copying using or disseminating any of this information or taking any action in reliance on or regarding this information. If you have received this fax in error, please notify us immediately by telephone so that we can arrange for its return to [place]. Phone: [phone], Toll-Free: [phone], Fax: [phone] Page: 2 of 2 Call Id: [id number] Guidelines Guideline Title Affirmed Question Affirmed Notes Nurse Date/Time Lamount Cohen Time) wheezes) AND [2] not resolved after 2 inhaler or nebulizer treatments given 20 minutes apart Disp. Time Lamount Cohen Time) Disposition Final User 09/18/2020 12:28:30 PM Send to Urgent Queue [place] 09/18/2020 12:37:55 PM Go to ED Now (or PCP triage) Yes Micheline Maze, RN, Pablo Ledger Disagree/Comply Comply Caller Understands Yes PreDisposition Call Doctor Care Advice Given Per Guideline GO TO ED NOW (OR PCP TRIAGE): * IF PCP SECOND-LEVEL TRIAGE REQUIRED: You may need to be seen. Your doctor (or NP/PA) will want to talk with you to decide what's best. I'll page the provider on-call now. If you haven't heard from the provider (or me) within 30  minutes, go directly to the ED/UCC at _____________ Hospital. ASTHMA ATTACK - TREATMENT -  QUICKRELIEF MEDICINE: * If you have not used your inhaler or nebulizer in the PAST 20 MINUTES, take 2 to 4 puffs of your quickrelief ALBUTEROL (SALBUTAMOL) INHALER right now. CARE ADVICE given per Asthma Attack (Adult) guideline. Comments User: Lanier Clam, RN Date/Time Lamount Cohen Time): 09/18/2020 12:38:18 PM Patient will find an Urgent Care near her to go to Referrals GO TO FACILITY UNDECIDED

## 2020-09-22 NOTE — Progress Notes (Deleted)
    Alyssa Le is a 35 y.o. female who presents to [facility] at [facility] today for f/u of R lateral foot pain / possible cuboid fx that began on 09/02/20 when she stepped onto a dog bone and rolled her foot and ankle into inversion.  She was last seen by Dr. Denyse Amass on 09/09/20 and noted improvement in her symptoms.  She noted that the main c/o was discomfort in her R foot when out of the boot.  She was advised to con't wearing her CAM walker boot until next f/u.  Since her last visit w/ Dr. Denyse Amass, pt reports   Diagnostic imaging: R foot XR- 09/09/20, 09/02/20  Pertinent review of systems: ***  Relevant historical information: ***   Exam:  LMP 08/25/2020  General: Well Developed, well nourished, and in no acute distress.   MSK: ***    Lab and Radiology Results No results found for this or any previous visit (from the past 72 hour(s)). No results found.     Assessment and Plan: 35 y.o. female with ***   PDMP not reviewed this encounter. No orders of the defined types were placed in this encounter.  No orders of the defined types were placed in this encounter.    Discussed warning signs or symptoms. Please see discharge instructions. Patient expresses understanding.   ***

## 2020-09-23 ENCOUNTER — Other Ambulatory Visit: Payer: Self-pay

## 2020-09-23 ENCOUNTER — Ambulatory Visit (INDEPENDENT_AMBULATORY_CARE_PROVIDER_SITE_OTHER): Payer: BC Managed Care – PPO | Admitting: Family Medicine

## 2020-09-23 ENCOUNTER — Ambulatory Visit: Payer: BC Managed Care – PPO | Admitting: Family Medicine

## 2020-09-23 ENCOUNTER — Ambulatory Visit (INDEPENDENT_AMBULATORY_CARE_PROVIDER_SITE_OTHER): Payer: BC Managed Care – PPO

## 2020-09-23 ENCOUNTER — Encounter: Payer: Self-pay | Admitting: Family Medicine

## 2020-09-23 VITALS — BP 110/78 | HR 100 | Ht 66.0 in

## 2020-09-23 DIAGNOSIS — M79671 Pain in right foot: Secondary | ICD-10-CM | POA: Diagnosis not present

## 2020-09-23 NOTE — Patient Instructions (Signed)
Thank you for coming in today.  Ok to transition to post op shoe with ankle brace.   Recheck in about 2 weeks.   If not better next step will probably be MRI.

## 2020-09-23 NOTE — Progress Notes (Signed)
   Wynema Birch, am serving as a Neurosurgeon for Dr. Clementeen Graham.  Alyssa Le is a 35 y.o. female who presents to [facility] at [facility] today for f/u R foot pain thought to be due to a cuboid fx. Pt was last seen by Dr. Denyse Amass on 09/09/20 and was advised to cont CAM walker boot and weightbear as tolerated. Pt called the clinic on 4/8 c/o a "burning" sensation around the injury site. Today, pt reports that she is feeling better, stated that she is not sure what to expect from a broken bone because has not had one before but does feel like it is less uncomfortable.   Dx imaging: 09/09/20 R foot XR  09/02/20 R foot XR  Pertinent review of systems: No fevers or chills  Relevant historical information: Diverticular disease   Exam:  BP 110/78 (BP Location: Left Arm, Patient Position: Sitting, Cuff Size: Normal)   Pulse 100   Ht 5\' 6"  (1.676 m)   LMP 08/25/2020   SpO2 98%   BMI 23.40 kg/m  General: Well Developed, well nourished, and in no acute distress.   MSK: Right foot normal-appearing Mildly tender palpation dorsal lateral midfoot. Normal foot motion.    Lab and Radiology Results  X-ray images right foot obtained today personally independently interpreted. No visible fractures present. Await formal radiology review    Assessment and Plan: 35 y.o. female with right foot pain.  Patient had an acute injury occurring March 30 where she inverted her foot.  Initially it was thought that she had a subtle proximal fifth metatarsal fracture.  Then I was concerned for a not very visible cuboid fracture.  I still think it is likely that she has a subtle cuboid fracture that is not visible on x-ray.  Clinically she is significantly improving.  Plan to transition to postop shoe and ankle brace and recheck in about 2 weeks. If not doing well or having setbacks next step would be MRI.  PDMP not reviewed this encounter. Orders Placed This Encounter  Procedures  . DG  Foot Complete Right    Standing Status:   Future    Number of Occurrences:   1    Standing Expiration Date:   09/23/2021    Order Specific Question:   Reason for Exam (SYMPTOM  OR DIAGNOSIS REQUIRED)    Answer:   Right foot pain    Order Specific Question:   Is patient pregnant?    Answer:   No    Order Specific Question:   Preferred imaging location?    Answer:   09/25/2021   No orders of the defined types were placed in this encounter.    Discussed warning signs or symptoms. Please see discharge instructions. Patient expresses understanding.   The above documentation has been reviewed and is accurate and complete Kyra Searles, M.D.

## 2020-09-24 NOTE — Progress Notes (Signed)
Foot x-ray looks about the same as last time.  No visible fractures present.

## 2020-10-06 ENCOUNTER — Ambulatory Visit (INDEPENDENT_AMBULATORY_CARE_PROVIDER_SITE_OTHER): Payer: BC Managed Care – PPO | Admitting: Family Medicine

## 2020-10-06 ENCOUNTER — Other Ambulatory Visit: Payer: Self-pay

## 2020-10-06 ENCOUNTER — Encounter: Payer: Self-pay | Admitting: Family Medicine

## 2020-10-06 VITALS — BP 101/68 | HR 96 | Temp 98.6°F | Ht 66.0 in | Wt 142.0 lb

## 2020-10-06 DIAGNOSIS — L308 Other specified dermatitis: Secondary | ICD-10-CM | POA: Diagnosis not present

## 2020-10-06 DIAGNOSIS — J302 Other seasonal allergic rhinitis: Secondary | ICD-10-CM | POA: Diagnosis not present

## 2020-10-06 DIAGNOSIS — L309 Dermatitis, unspecified: Secondary | ICD-10-CM | POA: Insufficient documentation

## 2020-10-06 DIAGNOSIS — J4520 Mild intermittent asthma, uncomplicated: Secondary | ICD-10-CM | POA: Diagnosis not present

## 2020-10-06 MED ORDER — FLUTICASONE PROPIONATE 50 MCG/ACT NA SUSP: 2.0000 | Freq: Two times a day (BID) | NASAL | 11 refills | Status: DC

## 2020-10-06 MED ORDER — ALBUTEROL SULFATE HFA 108 (90 BASE) MCG/ACT IN AERS: INHALATION_SPRAY | RESPIRATORY_TRACT | 1 refills | Status: DC

## 2020-10-06 MED ORDER — CETIRIZINE HCL 10 MG PO TABS: 10.0000 mg | ORAL_TABLET | Freq: Every day | ORAL | 3 refills | Status: DC

## 2020-10-06 MED ORDER — OLOPATADINE HCL 0.2 % OP SOLN: OPHTHALMIC | 11 refills | Status: DC

## 2020-10-06 MED ORDER — MONTELUKAST SODIUM 10 MG PO TABS: 10.0000 mg | ORAL_TABLET | Freq: Every day | ORAL | 3 refills | Status: DC

## 2020-10-06 MED ORDER — AZELASTINE HCL 0.1 % NA SOLN: 1.0000 | Freq: Two times a day (BID) | NASAL | 12 refills | Status: DC

## 2020-10-06 NOTE — Patient Instructions (Signed)
Great to see you today.  I have refilled the medication(s) we provide.    Allergies, Adult An allergy means that your body reacts to something that bothers it (allergen). This can happen from something that you eat, breathe in, or touch. Allergies often affect the nose, eyes, skin, and stomach. They can be mild, moderate, or very bad (severe). An allergy cannot spread from person to person. They can happen at any age. Sometimes, people outgrow them. What are the causes?  Outdoor things, such as pollen, car fumes, and mold.  Indoor things, such as dust, smoke, mold, and pets.  Foods.  Medicines.  Things that bother your skin, such as perfume and bug bites. What increases the risk?  Having family members with allergies or asthma. What are the signs or symptoms? Symptoms depend on how bad your allergy is. Mild to moderate symptoms  Runny nose, stuffy nose, or sneezing.  Itchy mouth, ears, or throat.  A feeling of mucus dripping down the back of your throat.  Sore throat.  Eyes that are itchy, red, watery, or puffy.  A skin rash, or red, swollen areas of skin (hives).  Stomach cramps or bloating. Severe symptoms Very bad allergies to food, medicine, or bug bites may cause a very bad allergy reaction (anaphylaxis). This can be life-threatening. Symptoms include:  A red face.  Wheezing or coughing.  Swollen lips, tongue, or mouth.  Tight or swollen throat.  Chest pain or tightness, or a fast heartbeat.  Trouble breathing or shortness of breath.  Pain in your belly (abdomen), vomiting, or watery poop (diarrhea).  Feeling dizzy or fainting. How is this treated? Treatment for this condition depends on your symptoms. Treatment may include:  Cold, wet cloths for itching and swelling.  Eye drops, nose sprays, or skin creams.  Washing out your nose each day.  A humidifier.  Medicines.  A change to the foods you eat.  Being exposed again and again to tiny  amounts of allergens. This helps your body get used to them. You might have: ? Allergy shots. ? Very small amounts of allergen put under your tongue.  An emergency shot (auto-injector pen) if you have a very bad allergy reaction. ? This is a medicine with a needle. You can put it into your skin by yourself. ? Your doctor will teach you how to use it.      Follow these instructions at home: Medicines  Take or apply over-the-counter and prescription medicines only as told by your doctor.  If you are at risk for a very bad allergy reaction, keep an auto-injector pen with you all the time.   Eating and drinking  Follow instructions from your doctor about what to eat and drink.  Drink enough fluid to keep your pee (urine) pale yellow. General instructions  If you have ever had a very bad allergy reaction, wear a medical alert bracelet or necklace.  Stay away from things that you are allergic to.  Keep all follow-up visits as told by your doctor. This is important. Contact a doctor if:  Your symptoms do not get better with treatment. Get help right away if:  You have symptoms of a very bad allergy reaction. These include: ? A swollen mouth, tongue, or throat. ? Pain or tightness in your chest. ? Trouble breathing. ? Being short of breath. ? Dizziness. ? Fainting. ? Very bad pain in your belly. ? Vomiting. ? Watery poop. These symptoms may be an emergency. Do not wait  to see if the symptoms will go away. Get medical help right away. Call your local emergency services (911 in the [place] Do not drive yourself to the hospital. Summary  Take or apply over-the-counter and prescription medicines only as told by your doctor.  Stay away from things you are allergic to.  If you are at risk for a very bad allergy reaction, carry an auto-injector pen all the time.  Wear a medical alert bracelet or necklace.  Very bad allergy reactions can be life-threatening. Get help right  away. This information is not intended to replace advice given to you by your health care provider. Make sure you discuss any questions you have with your health care provider. Document Revised: 04/03/2019 Document Reviewed: 04/03/2019 Elsevier Patient Education  2021 [employer].

## 2020-10-06 NOTE — Progress Notes (Signed)
Alyssa Le , [birth year], 35 y.o., female MRN: [id number] Patient Care Team    Relationship Specialty Notifications Start End  Natalia Leatherwood, DO PCP - General Family Medicine  04/27/16     Chief Complaint  Patient presents with  . Allergies    Pt has had worsening in allergies in the last few months and had experienced rash above both eyes; declined referral at this time  . Asthma    Pt has experienced SOB along with allergies      Subjective: Alyssa Le is a 35 y.o. female present for worsening allergies and asthma symptoms since change in weather a few months ago. She has had a h/o of significant seasonal allergies in the past.  She is currently taking allergra -D, Flonase, pataday drops and occassionally her albuterol inhaler. Many years ago she had also taken singulair.  She endorses itchy watery eyes, nasal congestion, runny nose, PND and asthma symptoms. She also has ahd a mild eczema type rash on her eyelids and arms that responded to  OTC hydrocortisone and Cetaphil.    Depression screen [facility] 2/9 10/06/2020 02/15/2017  Decreased Interest 0 0  Down, Depressed, Hopeless 0 0  PHQ - 2 Score 0 0    Allergies  Allergen Reactions  . Amoxicillin-Pot Clavulanate Shortness Of Breath and Other (See Comments)    Reaction:  Chest pain  - pt cannot tolerate Augmentin - but can take Amoxicillin  . Apple Anaphylaxis  . Other Anaphylaxis and Other (See Comments)    Pt states that she is allergic to fresh fruit and tree nuts.    . Ibuprofen Other (See Comments)    Causes ulcers  . Tree Extract   . Cephalexin Rash  . Latex Rash  . Perineal Cleansing [Balneol] Rash   Social History   Tobacco Use  . Smoking status: Never Smoker  . Smokeless tobacco: Never Used  Substance Use Topics  . Alcohol use: No   Past Medical History:  Diagnosis Date  . Allergy    multiple food & environmental allergies (tree pollens & fruits)  . Arthritis    exercise & allergy induced  . Asthma     inhaler last used 2015  . Closed displaced fracture of proximal phalanx of lesser toe of left foot 09/13/2016  . Complication of anesthesia    post operative pulmonary edema  . Headache   . Hx of gastric ulcer   . Kidney stone   . Ovarian cyst, right    surgically removed. 35 y.o.   Past Surgical History:  Procedure Laterality Date  . APPENDECTOMY    . 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 OVARIAN CYST REMOVAL     stump appendectomy, Pulmonary edema post op   Family History  Adopted: Yes  Problem Relation Age of Onset  . Alcohol abuse Neg Hx   . Arthritis Neg Hx   . Asthma Neg Hx   . Birth defects Neg Hx   . Cancer Neg Hx   . COPD Neg Hx   . Depression Neg Hx   . Diabetes Neg Hx   . Drug abuse Neg Hx   . Early death Neg Hx   . Hearing loss Neg Hx   . Heart disease Neg Hx   . Hyperlipidemia Neg Hx   . Hypertension Neg Hx   . Kidney disease Neg Hx   . Learning  disabilities Neg Hx   . Mental illness Neg Hx   . Mental retardation Neg Hx   . Miscarriages / Stillbirths Neg Hx   . Stroke Neg Hx   . Vision loss Neg Hx   . Varicose Veins Neg Hx    Allergies as of 10/06/2020      Reactions   Amoxicillin-pot Clavulanate Shortness Of Breath, Other (See Comments)   Reaction:  Chest pain  - pt cannot tolerate Augmentin - but can take Amoxicillin   Apple Anaphylaxis   Other Anaphylaxis, Other (See Comments)   Pt states that she is allergic to fresh fruit and tree nuts.     Ibuprofen Other (See Comments)   Causes ulcers   Tree Extract    Cephalexin Rash   Latex Rash   Perineal Cleansing [balneol] Rash      Medication List       Accurate as of Oct 06, 2020  9:28 AM. If you have any questions, ask your nurse or doctor.        STOP taking these medications   fexofenadine 180 MG tablet Commonly known as: ALLEGRA Stopped by: Felix Pacini, DO     TAKE these medications   albuterol 108 (90 Base)  MCG/ACT inhaler Commonly known as: VENTOLIN HFA Use 2 puffs twice daily for 4-6 days, then PRN q6h cough. What changed:   how much to take  how to take this  when to take this  reasons to take this  additional instructions   azelastine 0.1 % nasal spray Commonly known as: ASTELIN Place 1 spray into both nostrils 2 (two) times daily. Use in each nostril as directed Started by: Felix Pacini, DO   cetirizine 10 MG tablet Commonly known as: ZYRTEC Take 1 tablet (10 mg total) by mouth daily. Started by: Felix Pacini, DO   EPINEPHrine 0.3 mg/0.3 mL Soaj injection Commonly known as: EPI-PEN EpiPen 2-Pak 0.3 mg/0.3 mL injection, auto-injector   fluticasone 50 MCG/ACT nasal spray Commonly known as: FLONASE Place 2 sprays into both nostrils in the morning and at bedtime. What changed:   how much to take  when to take this Changed by: Felix Pacini, DO   montelukast 10 MG tablet Commonly known as: SINGULAIR Take 1 tablet (10 mg total) by mouth at bedtime. Started by: Felix Pacini, DO   norethindrone 0.35 MG tablet Commonly known as: MICRONOR Take 1 tablet by mouth daily.   Olopatadine HCl 0.2 % Soln 1 drop each eye daily What changed:   medication strength  how to take this  additional instructions Changed by: Felix Pacini, DO       All past medical history, surgical history, allergies, family history, immunizations andmedications were updated in the EMR today and reviewed under the history and medication portions of their EMR.     ROS: Negative, with the exception of above mentioned in HPI   Objective:  BP 101/68   Pulse 96   Temp 98.6 F (37 C) (Oral)   Ht  (1.676 m)   Wt 142 lb (64.4 kg)   SpO2 97%   BMI 22.92 kg/m  Body mass index is 22.92 kg/m. Gen: Afebrile. No acute distress. Nontoxic, pleasant female.  HENT: AT. [place]. Bilateral TM visualized and normal in appearance- small effusions bilateral. MMM. Bilateral nares mildly pale, mild  drainage. Throat without erythema or exudates. PND present. No cough or hoarseness.  Eyes:Pupils Equal Round Reactive to light, Extraocular movements intact,  Conjunctiva without redness, discharge or icterus.  Neck/lymp/endocrine: Supple,no lymphadenopathy CV: RRR  Chest: CTAB, no wheeze or crackles Skin: no rashes, purpura or petechiae.  Neuro:  Normal gait. PERLA. EOMi. Alert. Oriented x3 Psych: Normal affect, dress and demeanor. Normal speech. Normal thought content and judgment  No exam data present No results found. No results found for this or any previous visit (from the past 24 hour(s)).  Assessment/Plan: KARIZMA CHEEK is a 35 y.o. female present for OV for allergies. Seasonal allergies/Itchy eyes/asthma - worsening allergies - stop allegra d Start zyrtec qhs Start singulair qhs through early summer, then can try to wean of regimen one by one.  Continue flonase Start astelin Continue pataday drops prn - change air filters.  - F/U PRN  Eczema:  Can continue otc cetaphill and otc hydrocortisone as needed only, with caution on any steroid use long term on face/eyes.  Pt report understanding.    Reviewed expectations re: course of current medical issues.  Discussed self-management of symptoms.  Outlined signs and symptoms indicating need for more acute intervention.  Patient verbalized understanding and all questions were answered.  Patient received an After-Visit Summary.    No orders of the defined types were placed in this encounter.  Meds ordered this encounter  Medications  . cetirizine (ZYRTEC) 10 MG tablet    Sig: Take 1 tablet (10 mg total) by mouth daily.    Dispense:  90 tablet    Refill:  3  . fluticasone (FLONASE) 50 MCG/ACT nasal spray    Sig: Place 2 sprays into both nostrils in the morning and at bedtime.    Dispense:  16 g    Refill:  11  . albuterol (VENTOLIN HFA) 108 (90 Base) MCG/ACT inhaler    Sig: Use 2 puffs twice daily for 4-6 days,  then PRN q6h cough.    Dispense:  1 each    Refill:  1    May be any albuterol inhaler on formulary  . Olopatadine HCl 0.2 % SOLN    Sig: 1 drop each eye daily    Dispense:  2.5 mL    Refill:  11  . montelukast (SINGULAIR) 10 MG tablet    Sig: Take 1 tablet (10 mg total) by mouth at bedtime.    Dispense:  90 tablet    Refill:  3  . azelastine (ASTELIN) 0.1 % nasal spray    Sig: Place 1 spray into both nostrils 2 (two) times daily. Use in each nostril as directed    Dispense:  30 mL    Refill:  12   Referral Orders  No referral(s) requested today      Note is dictated utilizing voice recognition software. Although note has been proof read prior to signing, occasional typographical errors still can be missed. If any questions arise, please do not hesitate to call for verification.   electronically signed by:  Felix Pacini, DO  [facility] [place]

## 2020-10-06 NOTE — Progress Notes (Signed)
   I, Christoper Fabian, LAT, ATC, am serving as scribe for Dr. Clementeen Graham.  Alyssa Le is a 35 y.o. female who presents to [facility] at [facility] today for f/u of R lateral foot pain.  She was last seen by Dr. Denyse Amass on 09/23/20 and noted improvement in her symptoms.  She was advised to transition out of her CAM walker to an ankle brace and post-op shoe.  Since her last visit, pt reports that her R foot pain has improved.  She states that sometimes her foot is sore but overall ok.  She states that she tried the ankle brace and post-op shoe and that made her feel worse so she just put on her regular shoe.  She states that she has not returned to cycling yet.  Diagnostic imaging: R foot XR- 09/23/20, 09/09/20, 09/02/20  Pertinent review of systems: No fevers or chills  Relevant historical information: Asthma   Exam:  BP 130/90 (BP Location: Right Arm, Patient Position: Sitting, Cuff Size: Normal)   Pulse 98   Ht 5\' 6"  (1.676 m)   Wt 141 lb 9.6 oz (64.2 kg)   SpO2 98%   BMI 22.85 kg/m  General: Well Developed, well nourished, and in no acute distress.   MSK: Right foot normal.  Normal foot and ankle motion. Minimally tender dorsal lateral midfoot. Normal strength. Pulses cap refill and sensation are intact distally.    Lab and Radiology Results  EXAM: RIGHT FOOT COMPLETE - 3+ VIEW  COMPARISON:  09/09/2020.  FINDINGS: No acute bony or joint abnormality. No evidence of fracture or dislocation. Exam is stable from prior exam. No radiopaque foreign body.  IMPRESSION: No acute abnormality. No evidence of fracture dislocation. Exam is stable from prior exam.   Electronically Signed   By: 11/09/2020  Register   On: 09/24/2020 14:54 I, 09/26/2020, personally (independently) visualized and performed the interpretation of the images attached in this note.     Assessment and Plan: 35 y.o. female with right foot pain occurring with an injury March 30.  I think  it is likely that she has a radiographically occult injury to the cuboid.  However she clinically is doing quite well with minimal conservative management initially cam walker boot and subsequently an ankle brace.  Currently she is just in a regular shoe and doing quite well.  Plan for home exercise program focused on ankle stabilization and transition to normal activity as tolerated.  If not doing well physical therapy should be very helpful.  If having significant setbacks MRI of the foot would be next diagnostic step however I do not think this will be necessary.  ATC taught home exercise program today.  Recheck back with me as needed.    Discussed warning signs or symptoms. Please see discharge instructions. Patient expresses understanding.   The above documentation has been reviewed and is accurate and complete April 01, M.D.    Total encounter time 20 minutes including face-to-face time with the patient and, reviewing past medical record, and charting on the date of service.

## 2020-10-07 ENCOUNTER — Ambulatory Visit (INDEPENDENT_AMBULATORY_CARE_PROVIDER_SITE_OTHER): Payer: BC Managed Care – PPO | Admitting: Family Medicine

## 2020-10-07 ENCOUNTER — Encounter: Payer: Self-pay | Admitting: Family Medicine

## 2020-10-07 VITALS — BP 130/90 | HR 98 | Ht 66.0 in | Wt 141.6 lb

## 2020-10-07 DIAGNOSIS — M79671 Pain in right foot: Secondary | ICD-10-CM

## 2020-10-07 NOTE — Patient Instructions (Addendum)
Thank you for coming in today.  Start home exercises.   Please perform the exercise program that we have prepared for you and gone over in detail on a daily basis.  In addition to the handout you were provided you can access your program through: [email or web address]   Your unique program code is:  [email or web address]  Advance exercise as tolerated.   If needed PT would help.   Recheck with me as needed.

## 2020-12-17 ENCOUNTER — Telehealth: Payer: Self-pay

## 2020-12-17 NOTE — Telephone Encounter (Signed)
Pt sched for appt 7/15 at 9 but do not have avail childcare since husband is taking his mother to have colonoscopy. Pt is aware of no visitor policy but want to know if okay for youngest two can come with her.

## 2020-12-17 NOTE — Telephone Encounter (Signed)
Patient requesting advise on wound care.  She cut her left leg when she was at her fire pit.  Is not sure if it might be infected and needing someone to look at it.  Not a deep cut, no need of stitches  Please call [phone]

## 2020-12-18 ENCOUNTER — Ambulatory Visit: Payer: BC Managed Care – PPO | Admitting: Family Medicine

## 2020-12-18 ENCOUNTER — Other Ambulatory Visit: Payer: Self-pay

## 2020-12-18 ENCOUNTER — Encounter: Payer: Self-pay | Admitting: Family Medicine

## 2020-12-18 VITALS — BP 100/67 | HR 77 | Temp 98.2°F | Ht 66.0 in | Wt 140.0 lb

## 2020-12-18 DIAGNOSIS — L235 Allergic contact dermatitis due to other chemical products: Secondary | ICD-10-CM | POA: Diagnosis not present

## 2020-12-18 DIAGNOSIS — T148XXA Other injury of unspecified body region, initial encounter: Secondary | ICD-10-CM

## 2020-12-18 MED ORDER — MUPIROCIN CALCIUM 2 % EX CREA: 1.0000 "application " | TOPICAL_CREAM | Freq: Two times a day (BID) | CUTANEOUS | 0 refills | Status: DC

## 2020-12-18 MED ORDER — DOXYCYCLINE HYCLATE 100 MG PO TABS: 100.0000 mg | ORAL_TABLET | Freq: Two times a day (BID) | ORAL | 0 refills | Status: DC

## 2020-12-18 NOTE — Telephone Encounter (Signed)
Pt is aware.  

## 2020-12-18 NOTE — Patient Instructions (Signed)
Use the antibiotic every 12 hours for 5 days. Make sure you have some food on your stomach when taking.    Bactroban ointment/cream application daily.

## 2020-12-18 NOTE — Progress Notes (Signed)
This visit occurred during the SARS-CoV-2 public health emergency.  Safety protocols were in place, including screening questions prior to the visit, additional usage of staff PPE, and extensive cleaning of exam room while observing appropriate contact time as indicated for disinfecting solutions.    Alyssa Le , 02-09-1986, 35 y.o., female MRN: [id number] Patient Care Team    Relationship Specialty Notifications Start End  Natalia Leatherwood, DO PCP - General Family Medicine  04/27/16     Chief Complaint  Patient presents with   Abrasion    Pt presents with scratch on L leg 3 days ago the scratch is now having drainage and cyst; pt c/o swelling and itching x 2 days after apply neosporin      Subjective: Pt presents for an OV with complaints of multiple abrasions on her left lateral knee from a ceramic tile that was broken around her fire place at home. She has been keeping area clean and applying neosporin ointment. After 2 days of treating she has noticed redness, rash and itching over the area. She also endorses yellow clear drainage. Her tdap is UTD 2019. Depression screen [facility] 2/9 10/06/2020 02/15/2017  Decreased Interest 0 0  Down, Depressed, Hopeless 0 0  PHQ - 2 Score 0 0    Allergies  Allergen Reactions   Amoxicillin-Pot Clavulanate Shortness Of Breath and Other (See Comments)    Reaction:  Chest pain  - pt cannot tolerate Augmentin - but can take Amoxicillin   Apple Anaphylaxis   Other Anaphylaxis and Other (See Comments)    Pt states that she is allergic to fresh fruit and tree nuts.     Ibuprofen Other (See Comments)    Causes ulcers   Tree Extract    Cephalexin Rash   Latex Rash   Perineal Cleansing [Balneol] Rash   Social History   Social History Narrative   Ms. Hanback is from [place]. She lives with her husband, 2 small children, and mother-in-law.   She works part-time.   Past Medical History:  Diagnosis Date   Allergy    multiple food &  environmental allergies (tree pollens & fruits)   Arthritis    exercise & allergy induced   Asthma    inhaler last used 2015   Closed displaced fracture of proximal phalanx of lesser toe of left foot 09/13/2016   Complication of anesthesia    post operative pulmonary edema   Headache    Hx of gastric ulcer    Kidney stone    Ovarian cyst, right    surgically removed. 35 y.o.   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   OVARIAN CYST REMOVAL     stump appendectomy, Pulmonary edema post op   Family History  Adopted: Yes  Problem Relation Age of Onset   Alcohol abuse Neg Hx    Arthritis Neg Hx    Asthma Neg Hx    Birth defects Neg Hx    Cancer Neg Hx    COPD Neg Hx    Depression Neg Hx    Diabetes Neg Hx    Drug abuse Neg Hx    Early death Neg Hx    Hearing loss Neg Hx    Heart disease Neg Hx    Hyperlipidemia Neg Hx    Hypertension Neg Hx  Kidney disease Neg Hx    Learning disabilities Neg Hx    Mental illness Neg Hx    Mental retardation Neg Hx    Miscarriages / Stillbirths Neg Hx    Stroke Neg Hx    Vision loss Neg Hx    Varicose Veins Neg Hx    Allergies as of 12/18/2020       Reactions   Amoxicillin-pot Clavulanate Shortness Of Breath, Other (See Comments)   Reaction:  Chest pain  - pt cannot tolerate Augmentin - but can take Amoxicillin   Apple Anaphylaxis   Other Anaphylaxis, Other (See Comments)   Pt states that she is allergic to fresh fruit and tree nuts.     Ibuprofen Other (See Comments)   Causes ulcers   Tree Extract    Cephalexin Rash   Latex Rash   Perineal Cleansing [balneol] Rash        Medication List        Accurate as of December 18, 2020  9:16 AM. If you have any questions, ask your nurse or doctor.          albuterol 108 (90 Base) MCG/ACT inhaler Commonly known as: VENTOLIN HFA Use 2 puffs twice  daily for 4-6 days, then PRN q6h cough.   azelastine 0.1 % nasal spray Commonly known as: ASTELIN Place 1 spray into both nostrils 2 (two) times daily. Use in each nostril as directed   cetirizine 10 MG tablet Commonly known as: ZYRTEC Take 1 tablet (10 mg total) by mouth daily.   EPINEPHrine 0.3 mg/0.3 mL Soaj injection Commonly known as: EPI-PEN EpiPen 2-Pak 0.3 mg/0.3 mL injection, auto-injector   fluticasone 50 MCG/ACT nasal spray Commonly known as: FLONASE Place 2 sprays into both nostrils in the morning and at bedtime.   montelukast 10 MG tablet Commonly known as: SINGULAIR Take 1 tablet (10 mg total) by mouth at bedtime.   norethindrone 0.35 MG tablet Commonly known as: MICRONOR Take 1 tablet by mouth daily.   Olopatadine HCl 0.2 % Soln 1 drop each eye daily        All past medical history, surgical history, allergies, family history, immunizations andmedications were updated in the EMR today and reviewed under the history and medication portions of their EMR.     ROS: Negative, with the exception of above mentioned in HPI   Objective:  BP 100/67   Pulse 77   Temp 98.2 F (36.8 C) (Oral)   Ht 5\' 6"  (1.676 m)   Wt 140 lb (63.5 kg)   SpO2 97%   BMI 22.60 kg/m  Body mass index is 22.6 kg/m. Gen: Afebrile. No acute distress. Nontoxic in appearance, well developed, well nourished.  HENT: AT. [place]. Skin: x3 abrasions with no drainage. Mild erythema surrounding abrasions with small raised vesicles surrounding.   Neuro:Normal gait. PERLA. EOMi. Alert. Oriented x3  Psych: Normal affect, dress and demeanor. Normal speech. Normal thought content and judgment.  No results found. No results found. No results found for this or any previous visit (from the past 24 hour(s)).  Assessment/Plan: Alyssa Le is a 35 y.o. female present for OV for  Abrasion/Allergic dermatitis due to other chemical product Abrasions may have signs of early infection with surrounding  redness. Redness may be contributed to contact dermatitis from the neosporin use as well.  Elected to treat with doxy bid x5 days.  Bactroban cream/ointment application daily.  Keep area clean  Avoid use of neosporin in the future.  Restart  zyrtec before bed to help with itching.  F/u PRN  Reviewed expectations re: course of current medical issues. Discussed self-management of symptoms. Outlined signs and symptoms indicating need for more acute intervention. Patient verbalized understanding and all questions were answered. Patient received an After-Visit Summary.    No orders of the defined types were placed in this encounter.  No orders of the defined types were placed in this encounter.  Referral Orders  No referral(s) requested today     Note is dictated utilizing voice recognition software. Although note has been proof read prior to signing, occasional typographical errors still can be missed. If any questions arise, please do not hesitate to call for verification.   electronically signed by:  Felix Pacini, DO  [facility] [place]

## 2020-12-18 NOTE — Telephone Encounter (Signed)
Yes it is ok

## 2021-03-23 DIAGNOSIS — Z130 Encounter for screening for diseases of the blood and blood-forming organs and certain disorders involving the immune mechanism: Secondary | ICD-10-CM | POA: Diagnosis not present

## 2021-03-23 DIAGNOSIS — Z1389 Encounter for screening for other disorder: Secondary | ICD-10-CM | POA: Diagnosis not present

## 2021-03-23 DIAGNOSIS — Z8759 Personal history of other complications of pregnancy, childbirth and the puerperium: Secondary | ICD-10-CM | POA: Diagnosis not present

## 2021-03-23 DIAGNOSIS — Z01411 Encounter for gynecological examination (general) (routine) with abnormal findings: Secondary | ICD-10-CM | POA: Diagnosis not present

## 2021-03-23 DIAGNOSIS — E669 Obesity, unspecified: Secondary | ICD-10-CM | POA: Diagnosis not present

## 2021-03-29 DIAGNOSIS — R9389 Abnormal findings on diagnostic imaging of other specified body structures: Secondary | ICD-10-CM | POA: Diagnosis not present

## 2021-04-14 DIAGNOSIS — Z01812 Encounter for preprocedural laboratory examination: Secondary | ICD-10-CM | POA: Diagnosis not present

## 2021-04-14 DIAGNOSIS — Z0189 Encounter for other specified special examinations: Secondary | ICD-10-CM | POA: Diagnosis not present

## 2021-04-15 ENCOUNTER — Encounter (HOSPITAL_COMMUNITY): Payer: Self-pay | Admitting: Obstetrics and Gynecology

## 2021-04-15 NOTE — Anesthesia Preprocedure Evaluation (Addendum)
Anesthesia Evaluation  Patient identified by MRN, date of birth, ID band Patient awake    Reviewed: Allergy & Precautions, NPO status , Patient's Chart, lab work & pertinent test results  Airway Mallampati: II  TM Distance: >3 FB Neck ROM: Full    Dental no notable dental hx. (+) Dental Advisory Given   Pulmonary asthma ,    Pulmonary exam normal        Cardiovascular negative cardio ROS Normal cardiovascular exam     Neuro/Psych  Headaches,    GI/Hepatic negative GI ROS, Neg liver ROS,   Endo/Other  negative endocrine ROS  Renal/GU negative Renal ROS     Musculoskeletal  (+) Arthritis ,   Abdominal   Peds  Hematology negative hematology ROS (+)   Anesthesia Other Findings   Reproductive/Obstetrics                            Anesthesia Physical Anesthesia Plan  ASA: 2  Anesthesia Plan: General   Post-op Pain Management:    Induction: Intravenous  PONV Risk Score and Plan: 4 or greater and Ondansetron, Dexamethasone, Midazolam and Scopolamine patch - Pre-op  Airway Management Planned: LMA  Additional Equipment:   Intra-op Plan:   Post-operative Plan: Extubation in OR  Informed Consent: I have reviewed the patients History and Physical, chart, labs and discussed the procedure including the risks, benefits and alternatives for the proposed anesthesia with the patient or authorized representative who has indicated his/her understanding and acceptance.     Dental advisory given  Plan Discussed with: Anesthesiologist and CRNA  Anesthesia Plan Comments:        Anesthesia Quick Evaluation

## 2021-04-15 NOTE — Progress Notes (Signed)
PCP - Dr. Claiborne Billings Cardiologist - denies    Chest x-ray - denies EKG - denies Stress Test - denies ECHO - denies Cardiac Cath - denies  Sleep Study - denies     [facility]AS Protcol -  COVID TEST- ambulatory surgery   Anesthesia review: n/a  Patient denies shortness of breath, fever, cough and chest pain.   All instructions explained to the patient, with a verbal understanding of the material.The opportunity to ask questions was provided.

## 2021-04-16 ENCOUNTER — Ambulatory Visit (HOSPITAL_COMMUNITY): Payer: BC Managed Care – PPO | Admitting: Anesthesiology

## 2021-04-16 ENCOUNTER — Other Ambulatory Visit: Payer: Self-pay

## 2021-04-16 ENCOUNTER — Encounter (HOSPITAL_COMMUNITY): Payer: Self-pay | Admitting: Obstetrics and Gynecology

## 2021-04-16 ENCOUNTER — Ambulatory Visit (HOSPITAL_COMMUNITY)
Admission: RE | Admit: 2021-04-16 | Discharge: 2021-04-16 | Disposition: A | Discharge status: Home / Self Care | Payer: BC Managed Care – PPO | Source: Ambulatory Visit | Attending: Obstetrics and Gynecology | Admitting: Obstetrics and Gynecology

## 2021-04-16 ENCOUNTER — Encounter (HOSPITAL_COMMUNITY)
Admission: RE | Disposition: A | Discharge status: Home / Self Care | Payer: Self-pay | Source: Ambulatory Visit | Attending: Obstetrics and Gynecology

## 2021-04-16 DIAGNOSIS — N926 Irregular menstruation, unspecified: Secondary | ICD-10-CM | POA: Diagnosis not present

## 2021-04-16 DIAGNOSIS — Z86711 Personal history of pulmonary embolism: Secondary | ICD-10-CM | POA: Diagnosis not present

## 2021-04-16 DIAGNOSIS — J45909 Unspecified asthma, uncomplicated: Secondary | ICD-10-CM | POA: Diagnosis not present

## 2021-04-16 DIAGNOSIS — J302 Other seasonal allergic rhinitis: Secondary | ICD-10-CM | POA: Diagnosis not present

## 2021-04-16 DIAGNOSIS — M069 Rheumatoid arthritis, unspecified: Secondary | ICD-10-CM | POA: Insufficient documentation

## 2021-04-16 DIAGNOSIS — N840 Polyp of corpus uteri: Secondary | ICD-10-CM | POA: Insufficient documentation

## 2021-04-16 DIAGNOSIS — N939 Abnormal uterine and vaginal bleeding, unspecified: Secondary | ICD-10-CM | POA: Diagnosis not present

## 2021-04-16 DIAGNOSIS — R9389 Abnormal findings on diagnostic imaging of other specified body structures: Secondary | ICD-10-CM | POA: Diagnosis not present

## 2021-04-16 DIAGNOSIS — L309 Dermatitis, unspecified: Secondary | ICD-10-CM | POA: Diagnosis not present

## 2021-04-16 HISTORY — PX: DILATATION & CURETTAGE/HYSTEROSCOPY WITH MYOSURE: SHX6511

## 2021-04-16 HISTORY — DX: Anemia, unspecified: D64.9

## 2021-04-16 LAB — CBC
HCT: 37.4 % (ref 36.0–46.0)
Hemoglobin: 12.6 g/dL (ref 12.0–15.0)
MCH: 30.9 pg (ref 26.0–34.0)
MCHC: 33.7 g/dL (ref 30.0–36.0)
MCV: 91.7 fL (ref 80.0–100.0)
Platelets: 196 10*3/uL (ref 150–400)
RBC: 4.08 MIL/uL (ref 3.87–5.11)
RDW: 12.2 % (ref 11.5–15.5)
WBC: 4.9 10*3/uL (ref 4.0–10.5)
nRBC: 0 % (ref 0.0–0.2)

## 2021-04-16 LAB — TYPE AND SCREEN
ABO/RH(D): O POS
Antibody Screen: NEGATIVE

## 2021-04-16 LAB — POCT PREGNANCY, URINE: Preg Test, Ur: NEGATIVE

## 2021-04-16 SURGERY — DILATATION & CURETTAGE/HYSTEROSCOPY WITH MYOSURE
Anesthesia: General | Site: Uterus

## 2021-04-16 MED ORDER — SODIUM CHLORIDE 0.9 % IR SOLN
Status: DC | PRN
  Administered 2021-04-16: 1000 mL

## 2021-04-16 MED ORDER — LIDOCAINE HCL 1 % IJ SOLN
INTRAMUSCULAR | Status: AC
  Filled 2021-04-16: qty 20

## 2021-04-16 MED ORDER — CELECOXIB 200 MG PO CAPS: 200.0000 mg | ORAL_CAPSULE | Freq: Once | ORAL | Status: AC

## 2021-04-16 MED ORDER — CHLORHEXIDINE GLUCONATE 0.12 % MT SOLN: 15.0000 mL | OROMUCOSAL | Status: AC

## 2021-04-16 MED ORDER — MIDAZOLAM HCL 2 MG/2ML IJ SOLN
INTRAMUSCULAR | Status: DC | PRN
  Administered 2021-04-16: 2 mg via INTRAVENOUS

## 2021-04-16 MED ORDER — FENTANYL CITRATE (PF) 250 MCG/5ML IJ SOLN
INTRAMUSCULAR | Status: AC
  Filled 2021-04-16: qty 5

## 2021-04-16 MED ORDER — TRANEXAMIC ACID 1000 MG/10ML IV SOLN
1.5000 mg/kg/h | INTRAVENOUS | Status: DC
  Filled 2021-04-16: qty 25

## 2021-04-16 MED ORDER — SCOPOLAMINE 1 MG/3DAYS TD PT72: 1.0000 | MEDICATED_PATCH | TRANSDERMAL | Status: DC

## 2021-04-16 MED ORDER — CELECOXIB 200 MG PO CAPS
ORAL_CAPSULE | ORAL | Status: AC
  Administered 2021-04-16: 200 mg via ORAL
  Filled 2021-04-16: qty 1

## 2021-04-16 MED ORDER — LIDOCAINE 2% (20 MG/ML) 5 ML SYRINGE
INTRAMUSCULAR | Status: AC
  Filled 2021-04-16: qty 5

## 2021-04-16 MED ORDER — LACTATED RINGERS IV SOLN: INTRAVENOUS | Status: DC

## 2021-04-16 MED ORDER — PROPOFOL 10 MG/ML IV BOLUS
INTRAVENOUS | Status: DC | PRN
  Administered 2021-04-16: 160 mg via INTRAVENOUS

## 2021-04-16 MED ORDER — ONDANSETRON HCL 4 MG/2ML IJ SOLN
INTRAMUSCULAR | Status: AC
  Filled 2021-04-16: qty 2

## 2021-04-16 MED ORDER — ACETAMINOPHEN 500 MG PO TABS: 1000.0000 mg | ORAL_TABLET | Freq: Once | ORAL | Status: AC

## 2021-04-16 MED ORDER — TRANEXAMIC ACID-NACL 1000-0.7 MG/100ML-% IV SOLN
INTRAVENOUS | Status: AC
  Filled 2021-04-16: qty 100

## 2021-04-16 MED ORDER — LIDOCAINE 2% (20 MG/ML) 5 ML SYRINGE
INTRAMUSCULAR | Status: DC | PRN
  Administered 2021-04-16: 100 mg via INTRAVENOUS

## 2021-04-16 MED ORDER — KETOROLAC TROMETHAMINE 30 MG/ML IJ SOLN
INTRAMUSCULAR | Status: AC
  Filled 2021-04-16: qty 1

## 2021-04-16 MED ORDER — DEXAMETHASONE SODIUM PHOSPHATE 10 MG/ML IJ SOLN
INTRAMUSCULAR | Status: AC
  Filled 2021-04-16: qty 1

## 2021-04-16 MED ORDER — MIDAZOLAM HCL 2 MG/2ML IJ SOLN
INTRAMUSCULAR | Status: AC
  Filled 2021-04-16: qty 2

## 2021-04-16 MED ORDER — DEXAMETHASONE SODIUM PHOSPHATE 4 MG/ML IJ SOLN
INTRAMUSCULAR | Status: DC | PRN
  Administered 2021-04-16: 5 mg via INTRAVENOUS

## 2021-04-16 MED ORDER — FENTANYL CITRATE (PF) 100 MCG/2ML IJ SOLN: 25.0000 ug | INTRAMUSCULAR | Status: DC | PRN

## 2021-04-16 MED ORDER — ACETAMINOPHEN 500 MG PO TABS
ORAL_TABLET | ORAL | Status: AC
  Administered 2021-04-16: 1000 mg via ORAL
  Filled 2021-04-16: qty 2

## 2021-04-16 MED ORDER — POVIDONE-IODINE 10 % EX SWAB: 2.0000 "application " | Freq: Once | CUTANEOUS | Status: DC

## 2021-04-16 MED ORDER — OXYCODONE-ACETAMINOPHEN 5-325 MG PO TABS: 1.0000 | ORAL_TABLET | ORAL | 0 refills | Status: AC | PRN

## 2021-04-16 MED ORDER — SCOPOLAMINE 1 MG/3DAYS TD PT72
MEDICATED_PATCH | TRANSDERMAL | Status: AC
  Administered 2021-04-16: 1.5 mg via TRANSDERMAL
  Filled 2021-04-16: qty 1

## 2021-04-16 MED ORDER — AMISULPRIDE (ANTIEMETIC) 5 MG/2ML IV SOLN: 10.0000 mg | Freq: Once | INTRAVENOUS | Status: DC | PRN

## 2021-04-16 MED ORDER — PROMETHAZINE HCL 25 MG/ML IJ SOLN: 6.2500 mg | INTRAMUSCULAR | Status: DC | PRN

## 2021-04-16 MED ORDER — IBUPROFEN 600 MG PO TABS: 600.0000 mg | ORAL_TABLET | Freq: Four times a day (QID) | ORAL | 0 refills | Status: DC | PRN

## 2021-04-16 MED ORDER — PROPOFOL 10 MG/ML IV BOLUS
INTRAVENOUS | Status: AC
  Filled 2021-04-16: qty 40

## 2021-04-16 MED ORDER — CHLORHEXIDINE GLUCONATE 0.12 % MT SOLN
OROMUCOSAL | Status: AC
  Administered 2021-04-16: 15 mL via OROMUCOSAL
  Filled 2021-04-16: qty 15

## 2021-04-16 MED ORDER — KETOROLAC TROMETHAMINE 30 MG/ML IJ SOLN
30.0000 mg | Freq: Once | INTRAMUSCULAR | Status: AC
  Administered 2021-04-16: 30 mg via INTRAVENOUS

## 2021-04-16 MED ORDER — ONDANSETRON HCL 4 MG/2ML IJ SOLN
INTRAMUSCULAR | Status: DC | PRN
  Administered 2021-04-16: 4 mg via INTRAVENOUS

## 2021-04-16 MED ORDER — TRANEXAMIC ACID 1000 MG/10ML IV SOLN
INTRAVENOUS | Status: DC | PRN
  Administered 2021-04-16: 1000 mg via INTRAVENOUS

## 2021-04-16 MED ORDER — LIDOCAINE HCL 1 % IJ SOLN
INTRAMUSCULAR | Status: DC | PRN
  Administered 2021-04-16: 10 mL

## 2021-04-16 MED ORDER — ACETAMINOPHEN 500 MG PO TABS: 1000.0000 mg | ORAL_TABLET | ORAL | Status: DC

## 2021-04-16 MED ORDER — PHENYLEPHRINE HCL (PRESSORS) 10 MG/ML IV SOLN
INTRAVENOUS | Status: DC | PRN
  Administered 2021-04-16: 80 ug via INTRAVENOUS
  Administered 2021-04-16: 40 ug via INTRAVENOUS
  Administered 2021-04-16 (×2): 80 ug via INTRAVENOUS

## 2021-04-16 MED ORDER — FENTANYL CITRATE (PF) 250 MCG/5ML IJ SOLN
INTRAMUSCULAR | Status: DC | PRN
  Administered 2021-04-16: 100 ug via INTRAVENOUS

## 2021-04-16 SURGICAL SUPPLY — 17 items
CATH FOLEY LF 3WAY 5CC16FR (CATHETERS) ×1 IMPLANT
DEVICE MYOSURE LITE (MISCELLANEOUS) IMPLANT
DEVICE MYOSURE REACH (MISCELLANEOUS) ×1 IMPLANT
DRSG TELFA 3X8 NADH (GAUZE/BANDAGES/DRESSINGS) ×2 IMPLANT
GAUZE 4X4 16PLY ~~LOC~~+RFID DBL (SPONGE) ×2 IMPLANT
GLOVE SURG ENC MOIS LTX SZ6.5 (GLOVE) ×2 IMPLANT
GLOVE SURG UNDER POLY LF SZ7 (GLOVE) ×2 IMPLANT
GOWN STRL REUS W/ TWL LRG LVL3 (GOWN DISPOSABLE) ×2 IMPLANT
GOWN STRL REUS W/TWL LRG LVL3 (GOWN DISPOSABLE) ×4
KIT PROCEDURE FLUENT (KITS) ×2 IMPLANT
KIT TURNOVER KIT B (KITS) ×2 IMPLANT
PACK VAGINAL MINOR WOMEN LF (CUSTOM PROCEDURE TRAY) ×2 IMPLANT
PAD DRESSING TELFA 3X8 NADH (GAUZE/BANDAGES/DRESSINGS) IMPLANT
PAD OB MATERNITY 4.3X12.25 (PERSONAL CARE ITEMS) ×2 IMPLANT
SEAL ROD LENS SCOPE MYOSURE (ABLATOR) ×2 IMPLANT
TOWEL GREEN STERILE FF (TOWEL DISPOSABLE) ×4 IMPLANT
UNDERPAD 30X36 HEAVY ABSORB (UNDERPADS AND DIAPERS) ×2 IMPLANT

## 2021-04-16 NOTE — Op Note (Signed)
Operative Note    Preoperative Diagnosis AUB Endometrial polyp Thickened endometrium History of pp hemorrhage x 2   Postoperative Diagnosis:  AUB Endometrial polyp and cyst Thickened endometrium History of pp hemorrhage x 2   Procedure: Hysteroscopic dilation and curettage with myosure removal of polyp and cyst    Surgeon: Britt Bottom, DO   Anesthesia: General   Fluids: LR EBL: 66ml UOP: 69ml   Findings: Thickened diffuse endometrium in secretory phase, Endometrial polyp and endometrial cyst - clear fluid filled. Gorssly normal ostia in normal anatomic position   Specimen: Endometrial curettings, polyp and cyst    Procedure Note  Patient was taken to the operating room where anesthesia was administered without difficulty. She was then prepped and draped in the normal sterile fashion in the dorsal lithotomy position. An appropriate time out was performed.  A speculum was then placed within the vagina and the anterior lip of the cervix identified. 5cc of 1% plain lidocaine was injected at 9 and again at 3 oclock for a cervical block.  The uterus was then sounded to approximately 8cm and the [facility] dilators utilized to dilate the cervix up to 19  Next the Myosure operating scope was then introduced into the cavity. Inspection noted a very thickened lining with a moderate amount of debris hindering visualization. Thus the scope was removed and a curettage performed. Tissue sample sent to pathology. When the scope was replaced, a better surveywas able to be conducted. A small polyp and endometrial cyst were noted in the posterior fundal region of the uterus. The reach myosure was then used to remove the polyp, cyst and to sample the entire uterine lining which was now only mildly thickened but diffusely so.  The endocervical canal was also noted to have a thickened lining hence an endocervical curettage was also done with tissue sample sent to pathology as well.  The tenaculum  was then removed and the site rendered hemostatic with silver nitrate.  Finally the speculum was removed from the vagina and the patient awakened and taken to the recovery room in stable condition.  Counts were noted to be correct x 2

## 2021-04-16 NOTE — H&P (Signed)
Alyssa Le is an 35 y.o. female. [id number] hysteroscopy D&C with possible removal of endometrial polyp likely with myosure due to 1.1cm endometrial polyp and thickened posterior uterine wall noted during workup for AUB.  Pt had irregular cycles in past and was noted to have thickened endometrial lining. She opted for progestin pills over SIUS with/without D&C. She started on slynd then switched to norethindrone. She reported breakthrough bleeding while on it so stopped after about 4 months. Since July 2022 she has not used any hormonal medications and her cycles are lighter and has had no further breakthrough bleeding. [place] however showed lining still thickened on 03/29/21 and endometrial polyp. She has a history of pp hemorrhage x 2 ( needed bakri with last child). She also had a PE postoperatively in past Her medical conditions include vit D def, GERD, migraines, asthma and RA  She is sexually active and uses withdrawal method for [facility]. Considering having another child  Pertinent Gynecological History: Menses:  irregular Bleeding: dysfunctional uterine bleeding Contraception: none DES exposure: denies Blood transfusions: none Sexually transmitted diseases: no past history Previous GYN Procedures: DNC   Last pap: normal Date: 01/2018 OB History: G4, P4004   Menstrual History: Menarche age: 1 Patient's last menstrual period was 04/15/2021 (exact date).    Past Medical History:  Diagnosis Date   Allergy    multiple food & environmental allergies (tree pollens & fruits)   Anemia    Arthritis    exercise & allergy induced   Asthma    inhaler last used 2015   Closed displaced fracture of proximal phalanx of lesser toe of left foot 09/13/2016   Complication of anesthesia    post operative pulmonary edema   Headache    Hx of gastric ulcer    Kidney stone    Ovarian cyst, right    surgically removed. 35 y.o.    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   OVARIAN CYST REMOVAL     stump appendectomy, Pulmonary edema post op    Family History  Adopted: Yes  Problem Relation Age of Onset   Alcohol abuse Neg Hx    Arthritis Neg Hx    Asthma Neg Hx    Birth defects Neg Hx    Cancer Neg Hx    COPD Neg Hx    Depression Neg Hx    Diabetes Neg Hx    Drug abuse Neg Hx    Early death Neg Hx    Hearing loss Neg Hx    Heart disease Neg Hx    Hyperlipidemia Neg Hx    Hypertension Neg Hx    Kidney disease Neg Hx    Learning disabilities Neg Hx    Mental illness Neg Hx    Mental retardation Neg Hx    Miscarriages / Stillbirths Neg Hx    Stroke Neg Hx    Vision loss Neg Hx    Varicose Veins Neg Hx     Social History:  reports that she has never smoked. She has never used smokeless tobacco. She reports current alcohol use of about 2.0 standard drinks per week. She reports that she does not use drugs.  Allergies:  Allergies  Allergen Reactions   Amoxicillin-Pot Clavulanate Shortness Of Breath and Other (See Comments)    Reaction:  Chest pain  - pt cannot  tolerate Augmentin - but can take Amoxicillin   Apple Anaphylaxis   Other Anaphylaxis and Other (See Comments)    Pt states that she is allergic to fresh fruit and tree nuts.     Ibuprofen Other (See Comments)    Causes ulcers   Tree Extract     Eye swelling   Cephalexin Rash   Latex Rash   Neosporin Original [Bacitracin-Neomycin-Polymyxin] Rash   Perineal Cleansing [Balneol] Rash    Medications Prior to Admission  Medication Sig Dispense Refill Last Dose   albuterol (VENTOLIN HFA) 108 (90 Base) MCG/ACT inhaler Use 2 puffs twice daily for 4-6 days, then PRN q6h cough. 1 each 1 More than a month   azelastine (ASTELIN) 0.1 % nasal spray Place 1 spray into both nostrils 2 (two) times daily. Use in each nostril as directed (Patient not taking: Reported on 04/13/2021) 30 mL 12 Not  Taking   cetirizine (ZYRTEC) 10 MG tablet Take 1 tablet (10 mg total) by mouth daily. (Patient not taking: Reported on 04/13/2021) 90 tablet 3 Not Taking   doxycycline (VIBRA-TABS) 100 MG tablet Take 1 tablet (100 mg total) by mouth 2 (two) times daily. (Patient not taking: Reported on 04/13/2021) 10 tablet 0 Not Taking   EPINEPHrine 0.3 mg/0.3 mL IJ SOAJ injection EpiPen 2-Pak 0.3 mg/0.3 mL injection, auto-injector   Unknown   fluticasone (FLONASE) 50 MCG/ACT nasal spray Place 2 sprays into both nostrils in the morning and at bedtime. (Patient not taking: Reported on 04/13/2021) 16 g 11 Not Taking   montelukast (SINGULAIR) 10 MG tablet Take 1 tablet (10 mg total) by mouth at bedtime. (Patient not taking: Reported on 04/13/2021) 90 tablet 3 Not Taking   mupirocin cream (BACTROBAN) 2 % Apply 1 application topically 2 (two) times daily. (Patient not taking: Reported on 04/13/2021) 15 g 0 Not Taking   Olopatadine HCl 0.2 % SOLN 1 drop each eye daily (Patient not taking: Reported on 04/13/2021) 2.5 mL 11 Not Taking    Review of Systems  Constitutional:  Negative for activity change and fatigue.  Eyes:  Negative for photophobia.  Respiratory:  Negative for chest tightness and shortness of breath.   Cardiovascular:  Negative for chest pain, palpitations and leg swelling.  Gastrointestinal:  Negative for abdominal pain.  Genitourinary:  Positive for menstrual problem.  Skin:  Negative for pallor.  Neurological:  Negative for light-headedness and headaches.  Psychiatric/Behavioral:  The patient is nervous/anxious.    Blood pressure 106/65, pulse 76, temperature 98.4 F (36.9 C), temperature source Oral, resp. rate 17, height 5\' 6"  (1.676 m), weight 64.4 kg, last menstrual period 04/15/2021, SpO2 98 %, currently breastfeeding. Physical Exam Constitutional:      Appearance: Normal appearance. She is normal weight.  Abdominal:     Palpations: Abdomen is soft.  Musculoskeletal:        General: Normal  range of motion.     Cervical back: Normal range of motion.  Skin:    General: Skin is warm and dry.     Capillary Refill: Capillary refill takes 2 to 3 seconds.  Neurological:     General: No focal deficit present.     Mental Status: She is alert and oriented to person, place, and time. Mental status is at baseline.  Psychiatric:        Mood and Affect: Mood normal.        Behavior: Behavior normal.        Thought Content: Thought content normal.  Results for orders placed or performed during the hospital encounter of 04/16/21 (from the past 24 hour(s))  CBC     Status: None   Collection Time: 04/16/21  5:46 AM  Result Value Ref Range   WBC 4.9 4.0 - 10.5 K/uL   RBC 4.08 3.87 - 5.11 MIL/uL   Hemoglobin 12.6 12.0 - 15.0 g/dL   HCT 40.7 68.0 - 88.1 %   MCV 91.7 80.0 - 100.0 fL   MCH 30.9 26.0 - 34.0 pg   MCHC 33.7 30.0 - 36.0 g/dL   RDW 10.3 15.9 - 45.8 %   Platelets 196 150 - 400 K/uL   nRBC 0.0 0.0 - 0.2 %  Pregnancy, urine POC     Status: None   Collection Time: 04/16/21  5:56 AM  Result Value Ref Range   Preg Test, Ur NEGATIVE NEGATIVE    No results found.  Assessment/Plan: 35yo G4P4004 female with AUB here for hysteroscopy dilation and curettage and likely myosure removal of endometrial mass/polyp - Admit - ERAS protocol - Reviewed expectations during and after procedure - Pretreat with lysteda - Consent verified - To OR when ready  Fatuma Dowers W Seanpatrick Maisano 04/16/2021, 7:14 AM

## 2021-04-16 NOTE — Anesthesia Procedure Notes (Signed)
Procedure Name: LMA Insertion Date/Time: 04/16/2021 7:33 AM Performed by: Zollie Beckers, CRNA Pre-anesthesia Checklist: Patient identified, Emergency Drugs available, Suction available and Patient being monitored Patient Re-evaluated:Patient Re-evaluated prior to induction Oxygen Delivery Method: Circle System Utilized Preoxygenation: Pre-oxygenation with 100% oxygen Induction Type: IV induction Ventilation: Mask ventilation without difficulty LMA: LMA inserted LMA Size: 4.0 Number of attempts: 1 Airway Equipment and Method: Bite block Placement Confirmation: positive ETCO2 Tube secured with: Tape Dental Injury: Teeth and Oropharynx as per pre-operative assessment

## 2021-04-16 NOTE — Discharge Instructions (Signed)
Call office with any concerns [phone] 

## 2021-04-16 NOTE — Transfer of Care (Signed)
Immediate Anesthesia Transfer of Care Note  Patient: Alyssa Le  Procedure(s) Performed: DILATATION & CURETTAGE/HYSTEROSCOPY WITH MYOSURE (Uterus)  Patient Location: PACU  Anesthesia Type:General  Level of Consciousness: drowsy  Airway & Oxygen Therapy: Patient Spontanous Breathing and Patient connected to face mask oxygen  Post-op Assessment: Report given to RN and Post -op Vital signs reviewed and stable  Post vital signs: Reviewed and stable  Last Vitals:  Vitals Value Taken Time  BP 120/65 04/16/21 0818  Temp    Pulse 68 04/16/21 0821  Resp 12 04/16/21 0821  SpO2 100 % 04/16/21 0821  Vitals shown include unvalidated device data.  Last Pain:  Vitals:   04/16/21 0601  TempSrc:   PainSc: 0-No pain      Patients Stated Pain Goal: 0 (04/16/21 0601)  Complications: No notable events documented.

## 2021-04-16 NOTE — Anesthesia Postprocedure Evaluation (Signed)
Anesthesia Post Note  Patient: Alyssa Le  Procedure(s) Performed: DILATATION & CURETTAGE/HYSTEROSCOPY WITH MYOSURE (Uterus)     Patient location during evaluation: PACU Anesthesia Type: General Level of consciousness: sedated Pain management: pain level controlled Vital Signs Assessment: post-procedure vital signs reviewed and stable Respiratory status: spontaneous breathing and respiratory function stable Cardiovascular status: stable Postop Assessment: no apparent nausea or vomiting Anesthetic complications: no   No notable events documented.  Last Vitals:  Vitals:   04/16/21 0848 04/16/21 0903  BP: 110/61 104/64  Pulse: 79 65  Resp: (!) 26 14  Temp:  36.7 C  SpO2: 97% 100%    Last Pain:  Vitals:   04/16/21 0903  TempSrc:   PainSc: 0-No pain                 Derec Mozingo DANIEL

## 2021-04-17 ENCOUNTER — Encounter (HOSPITAL_COMMUNITY): Payer: Self-pay | Admitting: Obstetrics and Gynecology

## 2021-04-19 LAB — SURGICAL PATHOLOGY

## 2021-05-14 ENCOUNTER — Encounter: Payer: Self-pay | Admitting: Family Medicine

## 2021-05-14 ENCOUNTER — Ambulatory Visit (INDEPENDENT_AMBULATORY_CARE_PROVIDER_SITE_OTHER): Payer: BC Managed Care – PPO | Admitting: Family Medicine

## 2021-05-14 ENCOUNTER — Other Ambulatory Visit: Payer: Self-pay

## 2021-05-14 VITALS — BP 108/66 | HR 88 | Temp 98.5°F | Ht 66.0 in | Wt 140.0 lb

## 2021-05-14 DIAGNOSIS — J4520 Mild intermittent asthma, uncomplicated: Secondary | ICD-10-CM

## 2021-05-14 DIAGNOSIS — R059 Cough, unspecified: Secondary | ICD-10-CM | POA: Diagnosis not present

## 2021-05-14 DIAGNOSIS — J302 Other seasonal allergic rhinitis: Secondary | ICD-10-CM | POA: Diagnosis not present

## 2021-05-14 DIAGNOSIS — J4521 Mild intermittent asthma with (acute) exacerbation: Secondary | ICD-10-CM | POA: Diagnosis not present

## 2021-05-14 MED ORDER — AZELASTINE HCL 0.1 % NA SOLN: 1.0000 | Freq: Two times a day (BID) | NASAL | 12 refills | Status: DC

## 2021-05-14 MED ORDER — FLUTICASONE PROPIONATE 50 MCG/ACT NA SUSP: 2.0000 | Freq: Two times a day (BID) | NASAL | 11 refills | Status: DC

## 2021-05-14 MED ORDER — QVAR REDIHALER 40 MCG/ACT IN AERB: 1.0000 | INHALATION_SPRAY | Freq: Two times a day (BID) | RESPIRATORY_TRACT | 0 refills | Status: DC

## 2021-05-14 MED ORDER — PREDNISONE 20 MG PO TABS: ORAL_TABLET | ORAL | 0 refills | Status: DC

## 2021-05-14 MED ORDER — MONTELUKAST SODIUM 10 MG PO TABS
10.0000 mg | ORAL_TABLET | Freq: Every day | ORAL | 3 refills | Status: DC
Start: 2021-05-14 — End: 2022-05-16

## 2021-05-14 MED ORDER — ALBUTEROL SULFATE HFA 108 (90 BASE) MCG/ACT IN AERS: INHALATION_SPRAY | RESPIRATORY_TRACT | 1 refills | Status: DC

## 2021-05-14 MED ORDER — AZITHROMYCIN 250 MG PO TABS: ORAL_TABLET | ORAL | 0 refills | Status: AC

## 2021-05-14 NOTE — Progress Notes (Signed)
This visit occurred during the SARS-CoV-2 public health emergency.  Safety protocols were in place, including screening questions prior to the visit, additional usage of staff PPE, and extensive cleaning of exam room while observing appropriate contact time as indicated for disinfecting solutions.    Alyssa Le , [birth year], 35 y.o., female MRN: [id number] Patient Care Team    Relationship Specialty Notifications Start End  Natalia Leatherwood, DO PCP - General Family Medicine  04/27/16   Edwinna Areola, DO Consulting Physician Obstetrics and Gynecology  05/14/21     Chief Complaint  Patient presents with   Cough    Pt c/o lingering dry cough and post nasal drip from flu for 3.5 wks;      Subjective: Pt presents for an OV with complaints of lingering dry cough for 3.5 weeks when she had influenza illness.  She has a history of mild asthma and significant allergies.  She reports she has not been taking any of her allergy regimen which included Zyrtec, Singulair, Flonase, Astelin nasal spray.  She has been using her albuterol inhaler.  She reports wheezing is present.  Even her family could hear her wheezing and suggested she see a doctor.  He denies fevers, chills or shortness of breath.  She states she will have coughing fits.  She coughed so hard yesterday she had emesis status post cough.  Depression screen [facility] 2/9 05/14/2021 10/06/2020 02/15/2017  Decreased Interest 0 0 0  Down, Depressed, Hopeless 0 0 0  PHQ - 2 Score 0 0 0    Allergies  Allergen Reactions   Amoxicillin-Pot Clavulanate Shortness Of Breath and Other (See Comments)    Reaction:  Chest pain  - pt cannot tolerate Augmentin - but can take Amoxicillin   Apple Anaphylaxis   Other Anaphylaxis and Other (See Comments)    Pt states that she is allergic to fresh fruit and tree nuts.     Ibuprofen Other (See Comments)    Causes ulcers   Tree Extract     Eye swelling   Cephalexin Rash   Latex Rash   Neosporin  Original [Bacitracin-Neomycin-Polymyxin] Rash   Perineal Cleansing [Balneol] Rash   Social History   Social History Narrative   Ms. Risse is from [place]. She lives with her husband, 2 small children, and mother-in-law.She works part-time.  Patient is adopted.    Past Medical History:  Diagnosis Date   Acquired arm deformity 06/14/2013   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 Had surgery to lengthen bone when she was 35 y.o. R humerus is shorter than L, Has markedly decreased ROM in shoulder, had steroid injections in past w/minimal relief. Chronic R shoulder pain.   Allergy    multiple food & environmental allergies (tree pollens & fruits)   Anemia    Arthritis    exercise & allergy induced   Asthma    inhaler last used 2015   Closed displaced fracture of proximal phalanx of lesser toe of left foot 09/13/2016   Complication of anesthesia    post operative pulmonary edema   Headache    Hx of gastric ulcer    Kidney stone    Ovarian cyst, right    surgically removed. 35 y.o.   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   OVARIAN CYST REMOVAL     stump appendectomy, Pulmonary edema post op   Family History  Adopted: Yes  Problem Relation Age of Onset   Alcohol abuse Neg Hx    Arthritis Neg Hx    Asthma Neg Hx    Birth defects Neg Hx    Cancer Neg Hx    COPD Neg Hx    Depression Neg Hx    Diabetes Neg Hx    Drug abuse Neg Hx    Early death Neg Hx    Hearing loss Neg Hx    Heart disease Neg Hx    Hyperlipidemia Neg Hx    Hypertension Neg Hx    Kidney disease Neg Hx     Learning disabilities Neg Hx    Mental illness Neg Hx    Mental retardation Neg Hx    Miscarriages / Stillbirths Neg Hx    Stroke Neg Hx    Vision loss Neg Hx    Varicose Veins Neg Hx    Allergies as of 05/14/2021       Reactions   Amoxicillin-pot Clavulanate Shortness Of Breath, Other (See Comments)   Reaction:  Chest pain  - pt cannot tolerate Augmentin - but can take Amoxicillin   Apple Anaphylaxis   Other Anaphylaxis, Other (See Comments)   Pt states that she is allergic to fresh fruit and tree nuts.     Ibuprofen Other (See Comments)   Causes ulcers   Tree Extract    Eye swelling   Cephalexin Rash   Latex Rash   Neosporin Original [bacitracin-neomycin-polymyxin] Rash   Perineal Cleansing [balneol] Rash        Medication List        Accurate as of May 14, 2021  5:27 PM. If you have any questions, ask your nurse or doctor.          STOP taking these medications    doxycycline 100 MG tablet Commonly known as: VIBRA-TABS Stopped by: Felix Pacini, DO   ibuprofen 600 MG tablet Commonly known as: ADVIL Stopped by: Felix Pacini, DO   Mirena (52 MG) 20 MCG/DAY Iud Generic drug: levonorgestrel Stopped by: Felix Pacini, DO   mupirocin cream 2 % Commonly known as: Bactroban Stopped by: Felix Pacini, DO       TAKE these medications    albuterol 108 (90 Base) MCG/ACT inhaler Commonly known as: VENTOLIN HFA Use 2 puffs twice daily for 4-6 days, then PRN q6h cough.   azelastine 0.1 % nasal spray Commonly known as: ASTELIN Place 1 spray into both nostrils 2 (two) times daily. Use in each nostril as directed   azithromycin 250 MG tablet Commonly known as: ZITHROMAX Take 2 tablets on day 1, then 1 tablet daily on days 2 through 5 Started by: Felix Pacini, DO   cetirizine 10 MG tablet Commonly known as: ZYRTEC Take 1 tablet (10 mg total) by mouth daily.   EPINEPHrine 0.3 mg/0.3 mL Soaj injection Commonly known as: EPI-PEN EpiPen 2-Pak 0.3 mg/0.3 mL  injection, auto-injector   fluticasone 50 MCG/ACT nasal spray Commonly known as: FLONASE Place 2 sprays into both nostrils in the morning and at bedtime.   montelukast 10 MG tablet Commonly known as: SINGULAIR Take 1 tablet (10 mg total) by mouth at bedtime.  Olopatadine HCl 0.2 % Soln 1 drop each eye daily   predniSONE 20 MG tablet Commonly known as: DELTASONE 60 mg x3d, 40 mg x3d, 20 mg x2d, 10 mg x2d Started by: Felix Pacini, DO   Qvar RediHaler 40 MCG/ACT inhaler Generic drug: beclomethasone Inhale 1 puff into the lungs 2 (two) times daily. Started by: Felix Pacini, DO        All past medical history, surgical history, allergies, family history, immunizations andmedications were updated in the EMR today and reviewed under the history and medication portions of their EMR.     ROS Negative, with the exception of above mentioned in HPI   Objective:  BP 108/66   Pulse 88   Temp 98.5 F (36.9 C) (Oral)   Ht 5\' 6"  (1.676 m)   Wt 140 lb (63.5 kg)   LMP 04/15/2021 (Exact Date)   SpO2 99%   BMI 22.60 kg/m  Body mass index is 22.6 kg/m. Physical Exam Gen: Afebrile. No acute distress. Nontoxic in appearance, well developed, well nourished.  HENT: AT. [place]. Bilateral TM visualized without erythema. MMM, no oral lesions. Bilateral nares without erythema, drainage present.. Throat without erythema or exudates.  Cough present. Eyes:Pupils Equal Round Reactive to light, Extraocular movements intact,  Conjunctiva without redness, discharge or icterus. Neck/lymp/endocrine: Supple, no lymphadenopathy CV: RRR no murmur, no edema Chest: Mild bilateral wheezing, greater on the left side.  Cough with deep inspiration present.  Good air movement, normal resp effort.  Skin: No rashes, purpura or petechiae.  Neuro: Normal gait. PERLA. EOMi. Alert. Oriented x3  Psych: Normal affect, dress and demeanor. Normal speech. Normal thought content and judgment.  No results found. No results  found. No results found for this or any previous visit (from the past 24 hour(s)).  Assessment/Plan: Alyssa Le is a 35 y.o. female present for OV for  Seasonal allergic rhinitis, unspecified trigger Restart allergy regimen.  Encouraged her to continue the Singulair throughout the year since her allergies are worsening. Restart OTC Zyrtec nightly Restart Singulair nightly Refilled Flonase and Astelin for her   Mild intermittent asthma with exacerbation/Cough in adult -Refilled albuterol for her use as needed for wheezing. Start Qvar 1 puff twice daily for at least 1 month, if asthma is better controlled at that time, can discontinue Qvar and use for acute illnesses or asthma exacerbations only. Start Z-Pak Start prednisone taper Follow-up 2 weeks if symptoms are not improving, sooner if worsening.  Would consider chest x-ray at that time.  Reviewed expectations re: course of current medical issues. Discussed self-management of symptoms. Outlined signs and symptoms indicating need for more acute intervention. Patient verbalized understanding and all questions were answered. Patient received an After-Visit Summary.    No orders of the defined types were placed in this encounter.  Meds ordered this encounter  Medications   azelastine (ASTELIN) 0.1 % nasal spray    Sig: Place 1 spray into both nostrils 2 (two) times daily. Use in each nostril as directed    Dispense:  30 mL    Refill:  12   fluticasone (FLONASE) 50 MCG/ACT nasal spray    Sig: Place 2 sprays into both nostrils in the morning and at bedtime.    Dispense:  16 g    Refill:  11   montelukast (SINGULAIR) 10 MG tablet    Sig: Take 1 tablet (10 mg total) by mouth at bedtime.    Dispense:  90 tablet    Refill:  3  albuterol (VENTOLIN HFA) 108 (90 Base) MCG/ACT inhaler    Sig: Use 2 puffs twice daily for 4-6 days, then PRN q6h cough.    Dispense:  1 each    Refill:  1    May be any albuterol inhaler on formulary    beclomethasone (QVAR REDIHALER) 40 MCG/ACT inhaler    Sig: Inhale 1 puff into the lungs 2 (two) times daily.    Dispense:  1 each    Refill:  0   azithromycin (ZITHROMAX) 250 MG tablet    Sig: Take 2 tablets on day 1, then 1 tablet daily on days 2 through 5    Dispense:  6 tablet    Refill:  0   predniSONE (DELTASONE) 20 MG tablet    Sig: 60 mg x3d, 40 mg x3d, 20 mg x2d, 10 mg x2d    Dispense:  18 tablet    Refill:  0    Referral Orders  No referral(s) requested today     Note is dictated utilizing voice recognition software. Although note has been proof read prior to signing, occasional typographical errors still can be missed. If any questions arise, please do not hesitate to call for verification.   electronically signed by:  Felix Pacini, DO  [facility] - OR

## 2021-05-14 NOTE — Patient Instructions (Signed)
    I have called in your allergy medications and Zpack, prednisone and new qvar inhaler for this illness.   If you are not seeing improvement or worsening, flllowup in 2 weeks.

## 2021-06-06 NOTE — L&D Delivery Note (Signed)
Delivery Note  Pt progressed to complete dilation and pushed for about 20 minutes.  At 10:58 AM a viable female was delivered via Vaginal, Spontaneous (Presentation:  ROA).  APGAR: 8, 9; weight  pending.   Placenta status: Spontaneous, Intact.  Cord: 3 vessels with the following complications: Nuchal x 1 reduced over head .  TXA and methergine given preventatively given h/o pp hemorrhage.  Anesthesia: Epidural Episiotomy: None Lacerations:  None  Est. Blood Loss (mL):  256mL  Mom to postpartum.  Baby to Couplet care / Skin to Skin.  D/w parents circumcision and they desire to proceed  Logan Bores 03/14/2022, 11:12 AM

## 2021-07-20 DIAGNOSIS — Z3201 Encounter for pregnancy test, result positive: Secondary | ICD-10-CM | POA: Diagnosis not present

## 2021-07-22 DIAGNOSIS — Z3201 Encounter for pregnancy test, result positive: Secondary | ICD-10-CM | POA: Diagnosis not present

## 2021-07-28 DIAGNOSIS — Z3201 Encounter for pregnancy test, result positive: Secondary | ICD-10-CM | POA: Diagnosis not present

## 2021-07-28 DIAGNOSIS — O4691 Antepartum hemorrhage, unspecified, first trimester: Secondary | ICD-10-CM | POA: Diagnosis not present

## 2021-07-28 DIAGNOSIS — O3680X0 Pregnancy with inconclusive fetal viability, not applicable or unspecified: Secondary | ICD-10-CM | POA: Diagnosis not present

## 2021-07-28 DIAGNOSIS — Z3A08 8 weeks gestation of pregnancy: Secondary | ICD-10-CM | POA: Diagnosis not present

## 2021-08-17 DIAGNOSIS — Z369 Encounter for antenatal screening, unspecified: Secondary | ICD-10-CM | POA: Diagnosis not present

## 2021-08-17 DIAGNOSIS — O09519 Supervision of elderly primigravida, unspecified trimester: Secondary | ICD-10-CM | POA: Diagnosis not present

## 2021-08-17 DIAGNOSIS — Z3A09 9 weeks gestation of pregnancy: Secondary | ICD-10-CM | POA: Diagnosis not present

## 2021-08-17 DIAGNOSIS — O09521 Supervision of elderly multigravida, first trimester: Secondary | ICD-10-CM | POA: Diagnosis not present

## 2021-08-17 DIAGNOSIS — Z113 Encounter for screening for infections with a predominantly sexual mode of transmission: Secondary | ICD-10-CM | POA: Diagnosis not present

## 2021-08-17 DIAGNOSIS — O26891 Other specified pregnancy related conditions, first trimester: Secondary | ICD-10-CM | POA: Diagnosis not present

## 2021-08-17 LAB — OB RESULTS CONSOLE GC/CHLAMYDIA
Chlamydia: NEGATIVE
Neisseria Gonorrhea: NEGATIVE

## 2021-08-18 DIAGNOSIS — Z369 Encounter for antenatal screening, unspecified: Secondary | ICD-10-CM | POA: Diagnosis not present

## 2021-08-18 LAB — HEPATITIS C ANTIBODY: HCV Ab: NEGATIVE

## 2021-08-18 LAB — OB RESULTS CONSOLE HIV ANTIBODY (ROUTINE TESTING): HIV: NONREACTIVE

## 2021-08-18 LAB — OB RESULTS CONSOLE RPR: RPR: NONREACTIVE

## 2021-08-18 LAB — OB RESULTS CONSOLE HEPATITIS B SURFACE ANTIGEN: Hepatitis B Surface Ag: NEGATIVE

## 2021-08-18 LAB — OB RESULTS CONSOLE RUBELLA ANTIBODY, IGM: Rubella: IMMUNE

## 2021-09-06 DIAGNOSIS — O09522 Supervision of elderly multigravida, second trimester: Secondary | ICD-10-CM | POA: Diagnosis not present

## 2021-10-21 DIAGNOSIS — Z363 Encounter for antenatal screening for malformations: Secondary | ICD-10-CM | POA: Diagnosis not present

## 2021-10-21 DIAGNOSIS — Z3A18 18 weeks gestation of pregnancy: Secondary | ICD-10-CM | POA: Diagnosis not present

## 2021-12-13 DIAGNOSIS — R350 Frequency of micturition: Secondary | ICD-10-CM | POA: Diagnosis not present

## 2021-12-17 DIAGNOSIS — Z3A27 27 weeks gestation of pregnancy: Secondary | ICD-10-CM | POA: Diagnosis not present

## 2021-12-17 DIAGNOSIS — Z3689 Encounter for other specified antenatal screening: Secondary | ICD-10-CM | POA: Diagnosis not present

## 2021-12-17 DIAGNOSIS — Z23 Encounter for immunization: Secondary | ICD-10-CM | POA: Diagnosis not present

## 2022-01-13 DIAGNOSIS — Z3A30 30 weeks gestation of pregnancy: Secondary | ICD-10-CM | POA: Diagnosis not present

## 2022-01-13 DIAGNOSIS — O4443 Low lying placenta NOS or without hemorrhage, third trimester: Secondary | ICD-10-CM | POA: Diagnosis not present

## 2022-02-17 DIAGNOSIS — Z3685 Encounter for antenatal screening for Streptococcus B: Secondary | ICD-10-CM | POA: Diagnosis not present

## 2022-02-17 LAB — OB RESULTS CONSOLE GBS: GBS: NEGATIVE

## 2022-02-21 DIAGNOSIS — Z23 Encounter for immunization: Secondary | ICD-10-CM | POA: Diagnosis not present

## 2022-03-14 ENCOUNTER — Inpatient Hospital Stay (HOSPITAL_COMMUNITY): Payer: BC Managed Care – PPO | Admitting: Anesthesiology

## 2022-03-14 ENCOUNTER — Inpatient Hospital Stay (HOSPITAL_COMMUNITY)
Admission: AD | Admit: 2022-03-14 | Discharge: 2022-03-15 | DRG: 807 | Disposition: A | Discharge status: Home / Self Care | Payer: BC Managed Care – PPO | Attending: Obstetrics and Gynecology | Admitting: Obstetrics and Gynecology

## 2022-03-14 ENCOUNTER — Other Ambulatory Visit: Payer: Self-pay

## 2022-03-14 ENCOUNTER — Encounter (HOSPITAL_COMMUNITY): Payer: Self-pay

## 2022-03-14 DIAGNOSIS — Z3A39 39 weeks gestation of pregnancy: Secondary | ICD-10-CM | POA: Diagnosis not present

## 2022-03-14 DIAGNOSIS — O26893 Other specified pregnancy related conditions, third trimester: Secondary | ICD-10-CM | POA: Diagnosis not present

## 2022-03-14 DIAGNOSIS — O9952 Diseases of the respiratory system complicating childbirth: Secondary | ICD-10-CM | POA: Diagnosis not present

## 2022-03-14 DIAGNOSIS — J45909 Unspecified asthma, uncomplicated: Secondary | ICD-10-CM | POA: Diagnosis not present

## 2022-03-14 HISTORY — DX: Personal history of other complications of pregnancy, childbirth and the puerperium: Z87.59

## 2022-03-14 LAB — CBC
HCT: 32.5 % — ABNORMAL LOW (ref 36.0–46.0)
Hemoglobin: 11 g/dL — ABNORMAL LOW (ref 12.0–15.0)
MCH: 29.6 pg (ref 26.0–34.0)
MCHC: 33.8 g/dL (ref 30.0–36.0)
MCV: 87.4 fL (ref 80.0–100.0)
Platelets: 231 10*3/uL (ref 150–400)
RBC: 3.72 MIL/uL — ABNORMAL LOW (ref 3.87–5.11)
RDW: 13.7 % (ref 11.5–15.5)
WBC: 8.6 10*3/uL (ref 4.0–10.5)
nRBC: 0.2 % (ref 0.0–0.2)

## 2022-03-14 LAB — TYPE AND SCREEN
ABO/RH(D): O POS
Antibody Screen: NEGATIVE

## 2022-03-14 LAB — RPR: RPR Ser Ql: NONREACTIVE

## 2022-03-14 MED ORDER — OXYTOCIN-SODIUM CHLORIDE 30-0.9 UT/500ML-% IV SOLN
1.0000 m[IU]/min | INTRAVENOUS | Status: DC
  Administered 2022-03-14: 2 m[IU]/min via INTRAVENOUS

## 2022-03-14 MED ORDER — SENNOSIDES-DOCUSATE SODIUM 8.6-50 MG PO TABS
2.0000 | ORAL_TABLET | ORAL | Status: DC
  Administered 2022-03-14: 2 via ORAL
  Filled 2022-03-14: qty 2

## 2022-03-14 MED ORDER — METHYLERGONOVINE MALEATE 0.2 MG/ML IJ SOLN: 0.2000 mg | Freq: Once | INTRAMUSCULAR | Status: AC

## 2022-03-14 MED ORDER — OXYTOCIN-SODIUM CHLORIDE 30-0.9 UT/500ML-% IV SOLN
2.5000 [IU]/h | INTRAVENOUS | Status: DC
  Filled 2022-03-14: qty 500

## 2022-03-14 MED ORDER — TERBUTALINE SULFATE 1 MG/ML IJ SOLN: 0.2500 mg | Freq: Once | INTRAMUSCULAR | Status: DC | PRN

## 2022-03-14 MED ORDER — TETANUS-DIPHTH-ACELL PERTUSSIS 5-2.5-18.5 LF-MCG/0.5 IM SUSY: 0.5000 mL | PREFILLED_SYRINGE | Freq: Once | INTRAMUSCULAR | Status: DC

## 2022-03-14 MED ORDER — ACETAMINOPHEN 325 MG PO TABS: 650.0000 mg | ORAL_TABLET | ORAL | Status: DC | PRN

## 2022-03-14 MED ORDER — SIMETHICONE 80 MG PO CHEW: 80.0000 mg | CHEWABLE_TABLET | ORAL | Status: DC | PRN

## 2022-03-14 MED ORDER — DIPHENHYDRAMINE HCL 25 MG PO CAPS: 25.0000 mg | ORAL_CAPSULE | Freq: Four times a day (QID) | ORAL | Status: DC | PRN

## 2022-03-14 MED ORDER — LACTATED RINGERS IV SOLN: INTRAVENOUS | Status: DC

## 2022-03-14 MED ORDER — LIDOCAINE HCL (PF) 1 % IJ SOLN: 30.0000 mL | INTRAMUSCULAR | Status: DC | PRN

## 2022-03-14 MED ORDER — BENZOCAINE-MENTHOL 20-0.5 % EX AERO
1.0000 | INHALATION_SPRAY | CUTANEOUS | Status: DC | PRN
  Administered 2022-03-14: 1 via TOPICAL
  Filled 2022-03-14: qty 56

## 2022-03-14 MED ORDER — FENTANYL CITRATE (PF) 100 MCG/2ML IJ SOLN: 50.0000 ug | INTRAMUSCULAR | Status: DC | PRN

## 2022-03-14 MED ORDER — TRANEXAMIC ACID-NACL 1000-0.7 MG/100ML-% IV SOLN
1000.0000 mg | Freq: Once | INTRAVENOUS | Status: AC
  Administered 2022-03-14: 1000 mg via INTRAVENOUS
  Filled 2022-03-14: qty 100

## 2022-03-14 MED ORDER — PHENYLEPHRINE 80 MCG/ML (10ML) SYRINGE FOR IV PUSH (FOR BLOOD PRESSURE SUPPORT)
80.0000 ug | PREFILLED_SYRINGE | INTRAVENOUS | Status: DC | PRN
  Filled 2022-03-14: qty 10

## 2022-03-14 MED ORDER — EPHEDRINE 5 MG/ML INJ: 10.0000 mg | INTRAVENOUS | Status: DC | PRN

## 2022-03-14 MED ORDER — ONDANSETRON HCL 4 MG PO TABS: 4.0000 mg | ORAL_TABLET | ORAL | Status: DC | PRN

## 2022-03-14 MED ORDER — LACTATED RINGERS IV SOLN: 500.0000 mL | Freq: Once | INTRAVENOUS | Status: DC

## 2022-03-14 MED ORDER — PRENATAL MULTIVITAMIN CH
1.0000 | ORAL_TABLET | Freq: Every day | ORAL | Status: DC
  Administered 2022-03-14 – 2022-03-15 (×2): 1 via ORAL
  Filled 2022-03-14 (×2): qty 1

## 2022-03-14 MED ORDER — ONDANSETRON HCL 4 MG/2ML IJ SOLN: 4.0000 mg | INTRAMUSCULAR | Status: DC | PRN

## 2022-03-14 MED ORDER — ZOLPIDEM TARTRATE 5 MG PO TABS: 5.0000 mg | ORAL_TABLET | Freq: Every evening | ORAL | Status: DC | PRN

## 2022-03-14 MED ORDER — ONDANSETRON HCL 4 MG/2ML IJ SOLN: 4.0000 mg | Freq: Four times a day (QID) | INTRAMUSCULAR | Status: DC | PRN

## 2022-03-14 MED ORDER — OXYCODONE-ACETAMINOPHEN 5-325 MG PO TABS: 2.0000 | ORAL_TABLET | ORAL | Status: DC | PRN

## 2022-03-14 MED ORDER — FENTANYL-BUPIVACAINE-NACL 0.5-0.125-0.9 MG/250ML-% EP SOLN
12.0000 mL/h | EPIDURAL | Status: DC | PRN
  Filled 2022-03-14: qty 250

## 2022-03-14 MED ORDER — OXYCODONE-ACETAMINOPHEN 5-325 MG PO TABS: 1.0000 | ORAL_TABLET | ORAL | Status: DC | PRN

## 2022-03-14 MED ORDER — DIPHENHYDRAMINE HCL 50 MG/ML IJ SOLN: 12.5000 mg | INTRAMUSCULAR | Status: DC | PRN

## 2022-03-14 MED ORDER — SOD CITRATE-CITRIC ACID 500-334 MG/5ML PO SOLN: 30.0000 mL | ORAL | Status: DC | PRN

## 2022-03-14 MED ORDER — WITCH HAZEL-GLYCERIN EX PADS: 1.0000 | MEDICATED_PAD | CUTANEOUS | Status: DC | PRN

## 2022-03-14 MED ORDER — IBUPROFEN 600 MG PO TABS
600.0000 mg | ORAL_TABLET | Freq: Four times a day (QID) | ORAL | Status: DC
  Administered 2022-03-14 – 2022-03-15 (×4): 600 mg via ORAL
  Filled 2022-03-14 (×4): qty 1

## 2022-03-14 MED ORDER — OXYTOCIN BOLUS FROM INFUSION
333.0000 mL | Freq: Once | INTRAVENOUS | Status: AC
  Administered 2022-03-14: 333 mL via INTRAVENOUS

## 2022-03-14 MED ORDER — METHYLERGONOVINE MALEATE 0.2 MG/ML IJ SOLN
INTRAMUSCULAR | Status: AC
  Administered 2022-03-14: 0.2 mg via INTRAMUSCULAR
  Filled 2022-03-14: qty 1

## 2022-03-14 MED ORDER — DIBUCAINE (PERIANAL) 1 % EX OINT: 1.0000 | TOPICAL_OINTMENT | CUTANEOUS | Status: DC | PRN

## 2022-03-14 MED ORDER — LACTATED RINGERS IV SOLN: 500.0000 mL | INTRAVENOUS | Status: DC | PRN

## 2022-03-14 MED ORDER — TRANEXAMIC ACID-NACL 1000-0.7 MG/100ML-% IV SOLN: 1000.0000 mg | INTRAVENOUS | Status: DC

## 2022-03-14 MED ORDER — MONTELUKAST SODIUM 10 MG PO TABS
10.0000 mg | ORAL_TABLET | Freq: Every day | ORAL | Status: DC
  Administered 2022-03-15: 10 mg via ORAL
  Filled 2022-03-14: qty 1

## 2022-03-14 MED ORDER — ALBUTEROL SULFATE HFA 108 (90 BASE) MCG/ACT IN AERS: 2.0000 | INHALATION_SPRAY | RESPIRATORY_TRACT | Status: DC | PRN

## 2022-03-14 MED ORDER — ALBUTEROL SULFATE (2.5 MG/3ML) 0.083% IN NEBU: 2.5000 mg | INHALATION_SOLUTION | Freq: Four times a day (QID) | RESPIRATORY_TRACT | Status: DC | PRN

## 2022-03-14 MED ORDER — LIDOCAINE HCL (PF) 1 % IJ SOLN
INTRAMUSCULAR | Status: DC | PRN
  Administered 2022-03-14: 5 mL via EPIDURAL

## 2022-03-14 MED ORDER — COCONUT OIL OIL
1.0000 | TOPICAL_OIL | Status: DC | PRN
  Administered 2022-03-15: 1 via TOPICAL

## 2022-03-14 MED ORDER — FENTANYL-BUPIVACAINE-NACL 0.5-0.125-0.9 MG/250ML-% EP SOLN
EPIDURAL | Status: DC | PRN
  Administered 2022-03-14: 12 mL/h via EPIDURAL

## 2022-03-14 MED ORDER — PHENYLEPHRINE 80 MCG/ML (10ML) SYRINGE FOR IV PUSH (FOR BLOOD PRESSURE SUPPORT): 80.0000 ug | PREFILLED_SYRINGE | INTRAVENOUS | Status: DC | PRN

## 2022-03-14 NOTE — Anesthesia Procedure Notes (Signed)
Epidural Patient location during procedure: OB Start time: 03/14/2022 3:50 AM End time: 03/14/2022 4:03 AM  Staffing Anesthesiologist: Barnet Glasgow, MD Performed: anesthesiologist   Preanesthetic Checklist Completed: patient identified, IV checked, site marked, risks and benefits discussed, surgical consent, monitors and equipment checked, pre-op evaluation and timeout performed  Epidural Patient position: sitting Prep: DuraPrep and site prepped and draped Patient monitoring: continuous pulse ox and blood pressure Approach: midline Location: L3-L4 Injection technique: LOR air  Needle:  Needle type: Tuohy  Needle gauge: 17 G Needle length: 9 cm and 9 Needle insertion depth: 6 cm Catheter type: closed end flexible Catheter size: 19 Gauge Catheter at skin depth: 12 cm Test dose: negative  Assessment Events: blood not aspirated, injection not painful, no injection resistance, no paresthesia and negative IV test  Additional Notes Patient identified. Risks/Benefits/Options discussed with patient including but not limited to bleeding, infection, nerve damage, paralysis, failed block, incomplete pain control, headache, blood pressure changes, nausea, vomiting, reactions to medication both or allergic, itching and postpartum back pain. Confirmed with bedside nurse the patient's most recent platelet count. Confirmed with patient that they are not currently taking any anticoagulation, have any bleeding history or any family history of bleeding disorders. Patient expressed understanding and wished to proceed. All questions were answered. Sterile technique was used throughout the entire procedure. Please see nursing notes for vital signs. Test dose was given through epidural needle and negative prior to continuing to dose epidural or start infusion. Warning signs of high block given to the patient including shortness of breath, tingling/numbness in hands, complete motor block, or any  concerning symptoms with instructions to call for help. Patient was given instructions on fall risk and not to get out of bed. All questions and concerns addressed with instructions to call with any issues1  Attempt (S) . Patient tolerated procedure well.

## 2022-03-14 NOTE — Anesthesia Preprocedure Evaluation (Signed)
Anesthesia Evaluation  Patient identified by MRN, date of birth, ID band Patient awake    Reviewed: Allergy & Precautions, NPO status , Patient's Chart, lab work & pertinent test results  Airway Mallampati: II  TM Distance: >3 FB Neck ROM: Full    Dental no notable dental hx. (+) Teeth Intact   Pulmonary asthma ,    Pulmonary exam normal breath sounds clear to auscultation       Cardiovascular Exercise Tolerance: Good Normal cardiovascular exam Rhythm:Regular Rate:Normal     Neuro/Psych  Headaches, negative psych ROS   GI/Hepatic negative GI ROS, Neg liver ROS,   Endo/Other    Renal/GU      Musculoskeletal   Abdominal   Peds  Hematology Lab Results      Component                Value               Date                      WBC                      8.6                 03/14/2022                HGB                      11.0 (L)            03/14/2022                HCT                      32.5 (L)            03/14/2022                MCV                      87.4                03/14/2022                PLT                      231                 03/14/2022              Anesthesia Other Findings All Latex and Cephalexin  Reproductive/Obstetrics (+) Pregnancy                             Anesthesia Physical Anesthesia Plan  ASA: 2  Anesthesia Plan: Epidural   Post-op Pain Management:    Induction:   PONV Risk Score and Plan:   Airway Management Planned:   Additional Equipment:   Intra-op Plan:   Post-operative Plan:   Informed Consent: I have reviewed the patients History and Physical, chart, labs and discussed the procedure including the risks, benefits and alternatives for the proposed anesthesia with the patient or authorized representative who has indicated his/her understanding and acceptance.       Plan Discussed with:   Anesthesia Plan Comments: (39.3 wk G5P4  for LEA)  Anesthesia Quick Evaluation  

## 2022-03-14 NOTE — MAU Note (Signed)
.  Alyssa Le is a 36 y.o. at [redacted]w[redacted]d here in MAU reporting: ctx every 4-5 minutes since 2300. Denies VB or LOF, but reports bloody/mucous-like discharge. +FM   Onset of complaint: 2300 Pain score: 8 Vitals:   03/14/22 0014  BP: 112/62  Pulse: 91  Resp: 19  Temp: 98.2 F (36.8 C)  SpO2: 99%     FHT:146 Lab orders placed from triage:  labor eval.

## 2022-03-14 NOTE — Lactation Note (Signed)
This note was copied from a baby's chart. Lactation Consultation Note  Patient Name: Alyssa Le [id number] Date: 03/14/2022 Reason for consult: Initial assessment;Term Age:38 hours Infant had one void and one stool since birth. Birth Parent is experienced at breastfeeding see maternal data below. Per Birth Parent, she feels breastfeeding is going well, infant BF well in L&D and latched 3 times since birth on MBU, most feedings are 10 to 20 minutes in length. LC did not observed latch at this time, infant recently BF for 20 minutes at 1645 pm prior to [facility] entering the room. LC reviewed hand expression with Birth Parent used breast model. Birth Parent will continue to BF infant according to hunger cues, on demand, 8 to 12+ times within 24 hrs, STS. Birth Parent knows if she has any BF questions, concerns or need latch assistance to call RN/LC .  LC discussed the importance of maternal diet, rest and hydration with Birth Parent. Mom made aware of O/P services, breastfeeding support groups, community resources, and our phone # for post-discharge questions.   Maternal Data Has patient been taught Hand Expression?: Yes Does the patient have breastfeeding experience prior to this delivery?: Yes How long did the patient breastfeed?: Per Birth Parent, she BF 2nd and 3 rd child for 2 years each and with 4th child she BF for 18 months, her 4 th child is currently 11 years old.  Feeding Mother's Current Feeding Choice: Breast Milk  LATCH Score (  Recent Latch assessment was done by RN Laurey Arrow) Latch: Grasps breast easily, tongue down, lips flanged, rhythmical sucking.  Audible Swallowing: A few with stimulation  Type of Nipple: Everted at rest and after stimulation  Comfort (Breast/Nipple): Soft / non-tender  Hold (Positioning): No assistance needed to correctly position infant at breast.  LATCH Score: 9   Lactation Tools Discussed/Used    Interventions Interventions: Breast feeding  basics reviewed;Skin to skin;Position options;Education;LC Services brochure  Discharge Pump: Hands Free;Personal  Consult Status Consult Status: Follow-up Date: 03/15/22 Follow-up type: In-patient    [name] 03/14/2022, 5:15 PM

## 2022-03-14 NOTE — MAU Note (Signed)
Pt reports contractions are longer and increasing in intensity

## 2022-03-14 NOTE — MAU Note (Signed)
Pt well controlled with paced breathing-denies need for intervention or medication at this time-does plan for an epidural for pain control in labor.

## 2022-03-14 NOTE — H&P (Signed)
36 y.o. [id number] @ [redacted]w[redacted]d presents with labor.  Otherwise has good fetal movement and no bleeding.  On L&D she has received an IV and is comfortable.    Pregnancy complicated by: History of postpartum hemorrhage x 2.  With last delivery was delayed about 30 minutes.  Both with EBL 1000-1200 mL 2. Advanced maternal age: NIPT low risk   Past Medical History:  Diagnosis Date   Acquired arm deformity 06/14/2013   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 Had surgery to lengthen bone when she was 36 y.o. R humerus is shorter than L, Has markedly decreased ROM in shoulder, had steroid injections in past w/minimal relief. Chronic R shoulder pain.   Allergy    multiple food & environmental allergies (tree pollens & fruits)   Anemia    Arthritis    exercise & allergy induced   Asthma    inhaler last used 2015   Closed displaced fracture of proximal phalanx of lesser toe of left foot [id number]   Complication of anesthesia    post operative pulmonary edema   Headache    History of postpartum hemorrhage    with last 2 deliveries   Hx of gastric ulcer    Kidney stone    Ovarian cyst, right    surgically removed. 36 y.o.    Past Surgical History:  Procedure Laterality Date   APPENDECTOMY     DILATATION & CURETTAGE/HYSTEROSCOPY WITH MYOSURE N/A 04/16/2021   Procedure: DILATATION & CURETTAGE/HYSTEROSCOPY WITH MYOSURE;  Surgeon: Sherlyn Hay, DO;  Location: [place];  Service: Gynecology;  Laterality: N/A;   HUMERUS SURGERY Right    bone lengthening surgery 36 y.o due to osteomyelitis at growth plate as infant   OVARIAN CYST REMOVAL     stump appendectomy, Pulmonary edema post op    OB History  Gravida Para Term Preterm AB Living  5 4 4     4   SAB IAB Ectopic Multiple Live Births        0 4    # Outcome Date GA Lbr Len/2nd Weight Sex Delivery Anes PTL Lv  5 Current           4 Term 12/12/17 [redacted]w[redacted]d 04:46 / 00:25 3770 g F Vag-Spont EPI  LIV   3 Term 08/03/14 [redacted]w[redacted]d 02:31 / 00:26 3800 g M Vag-Spont EPI  LIV  2 Term      Vag-Spont     1 Term      Vag-Spont       Social History   Socioeconomic History   Marital status: Married    Spouse name: Not on file   Number of children: 2   Years of education: Not on file   Highest education level: Not on file  Occupational History   Occupation: VET ASSIST/RECEPTIONIST    Employer: [employer]: part-time  Tobacco Use   Smoking status: Never   Smokeless tobacco: Never  Vaping Use   Vaping Use: Never used  Substance and Sexual Activity   Alcohol use: Yes    Alcohol/week: 2.0 standard drinks of alcohol    Types: 2 Glasses of wine per week   Drug use: No   Sexual activity: Yes    Comment: preg  Other Topics Concern   Not on file  Social History Narrative   Ms. Danzig is from [place]. She lives with her husband, 2 small children, and mother-in-law.She works part-time.  Patient  is adopted.    Social Determinants of Health   Financial Resource Strain: Not on file  Food Insecurity: No Food Insecurity (03/14/2022)   Hunger Vital Sign    Worried About Running Out of Food in the Last Year: Never true    Ran Out of Food in the Last Year: Never true  Transportation Needs: No Transportation Needs (03/14/2022)   PRAPARE - Hydrologist (Medical): No    Lack of Transportation (Non-Medical): No  Physical Activity: Not on file  Stress: Not on file  Social Connections: Not on file  Intimate Partner Violence: Not on file   Amoxicillin-pot clavulanate, Other, Tree extract, Cephalexin, Latex, Neosporin original [bacitracin-neomycin-polymyxin], and Perineal cleansing [balneol]    Prenatal Transfer Tool  Maternal Diabetes: No Genetic Screening: Normal Maternal Ultrasounds/Referrals: Normal Fetal Ultrasounds or other Referrals:  None Maternal Substance Abuse:  No Significant Maternal Medications:  Meds include: Protonix Significant  Maternal Lab Results: Group B Strep negative  ABO, Rh: --/--/O POS (10/09 0256) Antibody: NEG (10/09 0256) Rubella: Immune (03/15 0000) RPR: Nonreactive (03/15 0000)  HBsAg: Negative (03/15 0000)  HIV: Non-reactive (03/15 0000)  GBS: Negative/-- (09/14 0000)      Vitals:   03/14/22 0440 03/14/22 0500  BP: 108/65 (!) 94/46  Pulse: 89 82  Resp:    Temp:    SpO2: 97%      General:  NAD Abdomen:  soft, gravid, EFW 8.5# Ex:  no edema SVE:  unchanged at 6-7/70/-2, AROM thin meconium FHTs:  130s, moderate variability, category 1 Toco:  q5-8 minutes   A/P   36 y.o. [id number] [redacted]w[redacted]d presents with labor Contractions spaced after epidural. Now s/p AROM.  Will add pitocin prn H/o PPH x 2. Plan TXA at delivery  GBS negative Anticipate SVD  [place]

## 2022-03-15 ENCOUNTER — Inpatient Hospital Stay (HOSPITAL_COMMUNITY)
Admission: RE | Admit: 2022-03-15 | Payer: BC Managed Care – PPO | Source: Home / Self Care | Admitting: Obstetrics and Gynecology

## 2022-03-15 ENCOUNTER — Inpatient Hospital Stay (HOSPITAL_COMMUNITY): Payer: BC Managed Care – PPO

## 2022-03-15 LAB — CBC
HCT: 31.4 % — ABNORMAL LOW (ref 36.0–46.0)
Hemoglobin: 10.6 g/dL — ABNORMAL LOW (ref 12.0–15.0)
MCH: 29 pg (ref 26.0–34.0)
MCHC: 33.8 g/dL (ref 30.0–36.0)
MCV: 86 fL (ref 80.0–100.0)
Platelets: 246 10*3/uL (ref 150–400)
RBC: 3.65 MIL/uL — ABNORMAL LOW (ref 3.87–5.11)
RDW: 13.6 % (ref 11.5–15.5)
WBC: 9.5 10*3/uL (ref 4.0–10.5)
nRBC: 0 % (ref 0.0–0.2)

## 2022-03-15 MED ORDER — IBUPROFEN 600 MG PO TABS: 600.0000 mg | ORAL_TABLET | Freq: Four times a day (QID) | ORAL | 1 refills | Status: DC | PRN

## 2022-03-15 MED ORDER — TRIAMCINOLONE 0.1 % CREAM:EUCERIN CREAM 1:1: 1.0000 | TOPICAL_CREAM | Freq: Three times a day (TID) | CUTANEOUS | 1 refills | Status: AC

## 2022-03-15 MED ORDER — TRIAMCINOLONE 0.1 % CREAM:EUCERIN CREAM 1:1
TOPICAL_CREAM | Freq: Three times a day (TID) | CUTANEOUS | Status: DC
  Filled 2022-03-15: qty 1

## 2022-03-15 NOTE — Discharge Instructions (Signed)
Call office with any concerns [phone] 

## 2022-03-15 NOTE — Discharge Summary (Addendum)
Postpartum Discharge Summary  Date of Service updated      Patient Name: Alyssa Le DOB: Oct 26, 1985 MRN: [id number]  Date of admission: 03/14/2022 Delivery date:03/14/2022  Delivering provider: Huel Cote  Date of discharge: 03/15/2022  Admitting diagnosis: Normal labor [O80, Z37.9] NSVD (normal spontaneous vaginal delivery) [O80] Intrauterine pregnancy: [redacted]w[redacted]d     Secondary diagnosis:  Principal Problem:   Normal labor Active Problems:   NSVD (normal spontaneous vaginal delivery)  Additional problems: none    Discharge diagnosis: Term Pregnancy Delivered                                              Post partum procedures: none Augmentation: Pitocin Complications: None  Hospital course: Onset of Labor With Vaginal Delivery      36 y.o. yo G5P5005 at [redacted]w[redacted]d was admitted in Active Labor on 03/14/2022. Labor course was complicated bynone  Membrane Rupture Time/Date: 6:47 AM ,03/14/2022   Delivery Method:Vaginal, Spontaneous  Episiotomy: None  Lacerations:  None  Patient had a postpartum course complicated by n/a.  She is ambulating, tolerating a regular diet, passing flatus, and urinating well. Patient is discharged home in stable condition on 03/15/22.  Newborn Data: Birth date:03/14/2022  Birth time:10:58 AM  Gender:Female  Living status:Living  Apgars:9 ,9  Weight:3790 g   Magnesium Sulfate received: No BMZ received: No  Physical exam  Vitals:   03/14/22 1757 03/14/22 2131 03/15/22 0230 03/15/22 0516  BP: 100/63 (!) 120/52 113/72 107/64  Pulse: 85 89 72 75  Resp: 16 16 16 16   Temp: 98.7 F (37.1 C) 98.2 F (36.8 C) 98.2 F (36.8 C) 98.1 F (36.7 C)  TempSrc: Oral Oral Oral Oral  SpO2:  98% 100% 100%  Weight:      Height:       General: alert, cooperative, and no distress Lochia: appropriate Uterine Fundus: firm Incision: N/A DVT Evaluation: No evidence of DVT seen on physical exam. Labs: Lab Results  Component Value Date   WBC 9.5 03/15/2022    HGB 10.6 (L) 03/15/2022   HCT 31.4 (L) 03/15/2022   MCV 86.0 03/15/2022   PLT 246 03/15/2022      Latest Ref Rng & Units 01/19/2017   12:00 AM  CMP  BUN 4 - 21 13      Creatinine 0.5 - 1.1 0.7      Sodium 137 - 147 140      Potassium 3.4 - 5.3 4.3      Alkaline Phos 25 - 125 44      AST 13 - 35 17      ALT 7 - 35 15         This result is from an external source.   Edinburgh Score:    12/14/2017    6:00 AM  Edinburgh Postnatal Depression Scale Screening Tool  I have been able to laugh and see the funny side of things. 0  I have looked forward with enjoyment to things. 0  I have blamed myself unnecessarily when things went wrong. 0  I have been anxious or worried for no good reason. 0  I have felt scared or panicky for no good reason. 0  Things have been getting on top of me. 0  I have been so unhappy that I have had difficulty sleeping. 0  I have felt sad or  miserable. 0  I have been so unhappy that I have been crying. 0  The thought of harming myself has occurred to me. 0  Edinburgh Postnatal Depression Scale Total 0      After visit meds:  Allergies as of 03/15/2022       Reactions   Amoxicillin-pot Clavulanate Shortness Of Breath, Other (See Comments)   Reaction:  Chest pain  - pt cannot tolerate Augmentin - but can take Amoxicillin   Other Anaphylaxis, Other (See Comments)   Pt states that she is allergic to fresh fruit and tree nuts.     Tree Extract    Eye swelling   Cephalexin Rash   Latex Rash   Neosporin Original [bacitracin-neomycin-polymyxin] Rash   Perineal Cleansing [balneol] Rash        Medication List     STOP taking these medications    aspirin 81 MG chewable tablet       TAKE these medications    albuterol 108 (90 Base) MCG/ACT inhaler Commonly known as: VENTOLIN HFA Use 2 puffs twice daily for 4-6 days, then PRN q6h cough.   azelastine 0.1 % nasal spray Commonly known as: ASTELIN Place 1 spray into both nostrils 2 (two)  times daily. Use in each nostril as directed   cetirizine 10 MG tablet Commonly known as: ZYRTEC Take 1 tablet (10 mg total) by mouth daily.   EPINEPHrine 0.3 mg/0.3 mL Soaj injection Commonly known as: EPI-PEN EpiPen 2-Pak 0.3 mg/0.3 mL injection, auto-injector   fluticasone 50 MCG/ACT nasal spray Commonly known as: FLONASE Place 2 sprays into both nostrils in the morning and at bedtime.   ibuprofen 600 MG tablet Commonly known as: ADVIL Take 1 tablet (600 mg total) by mouth every 6 (six) hours as needed for moderate pain or cramping.   montelukast 10 MG tablet Commonly known as: SINGULAIR Take 1 tablet (10 mg total) by mouth at bedtime.   Olopatadine HCl 0.2 % Soln 1 drop each eye daily   pantoprazole 40 MG tablet Commonly known as: PROTONIX Take 40 mg by mouth daily.   predniSONE 20 MG tablet Commonly known as: DELTASONE 60 mg x3d, 40 mg x3d, 20 mg x2d, 10 mg x2d   prenatal multivitamin Tabs tablet Take 1 tablet by mouth daily at 12 noon.   Qvar RediHaler 40 MCG/ACT inhaler Generic drug: beclomethasone Inhale 1 puff into the lungs 2 (two) times daily.   triamcinolone 0.1 % cream : eucerin Crea Apply 1 Application topically 3 (three) times daily for 7 days.         Discharge home in stable condition Infant Feeding: Breast Infant Disposition:home with mother Discharge instruction: per After Visit Summary and Postpartum booklet. Activity: Advance as tolerated. Pelvic rest for 6 weeks.  Diet: routine diet Anticipated Birth Control: Unsure Postpartum Appointment:6 weeks Additional Postpartum F/U: Postpartum Depression checkup Future Appointments:No future appointments. Follow up Visit:  [place], [facility]. Schedule an appointment as soon as possible for a visit in 6 week(s).   Why: For postpartum visit Contact information: [address] [place] [address] [phone]                      03/15/2022 Isaiah Serge, DO

## 2022-03-15 NOTE — Anesthesia Postprocedure Evaluation (Signed)
Anesthesia Post Note  Patient: Alyssa Le  Procedure(s) Performed: AN AD [place]     Patient location during evaluation: Mother Baby Anesthesia Type: Epidural Level of consciousness: awake and alert and oriented Pain management: satisfactory to patient Vital Signs Assessment: post-procedure vital signs reviewed and stable Respiratory status: respiratory function stable Cardiovascular status: stable Postop Assessment: no headache, no backache, epidural receding, patient able to bend at knees, no signs of nausea or vomiting, adequate PO intake and able to ambulate Anesthetic complications: no   No notable events documented.  Last Vitals:  Vitals:   03/15/22 0230 03/15/22 0516  BP: 113/72 107/64  Pulse: 72 75  Resp: 16 16  Temp: 36.8 C 36.7 C  SpO2: 100% 100%    Last Pain:  Vitals:   03/15/22 0516  TempSrc: Oral  PainSc: 0-No pain   Pain Goal:                   Thelma Comp

## 2022-03-15 NOTE — Lactation Note (Addendum)
This note was copied from a baby's chart. Lactation Consultation Note  Patient Name: Alyssa Le Date: 03/15/2022 Reason for consult: Initial assessment;Term;Infant weight loss;Nipple pain/trauma Age:38 hours exp BF, per mom sorer on the right nipple than the left.  Per Dr. Ovid Curd baby will be able to be D/C after circ and per MD to supplement due to weight loss.  See doc flow sheets for latches. Worked with mom on the cross cradle and football for positioning.  For the latches worked on increasing the depth at the breast.  [place] recommended to mom after 4 feedings post pump to enhance the milk coming in.  Due to sore nipple recommended steps for latching to prevent further soreness.  Prior to latch - 1st breast - breast massage, hand express, pre-pump with the hand pump and reverse pressure ( stretch back exercise ). Latch STS, firm support cross cradle as shown or football until the baby is opening is mouth wider.  LC discussed pace feeding and reassured parents that would also help to widen the baby's mouth for latching.  Breast shells while awake between feedings due to areola edema.  LC reviewed BF D/C teaching and [place] resources.   Maternal Data Has patient been taught Hand Expression?: Yes (steady flow with hand expressing and pre pumping)  Feeding Mother's Current Feeding Choice: Breast Milk  LATCH Score Latch: Grasps breast easily, tongue down, lips flanged, rhythmical sucking.  Audible Swallowing: Spontaneous and intermittent  Type of Nipple: Everted at rest and after stimulation (areola edema)  Comfort (Breast/Nipple): Soft / non-tender  Hold (Positioning): Assistance needed to correctly position infant at breast and maintain latch.  LATCH Score: 9   Lactation Tools Discussed/Used Tools: Shells;Pump;Flanges Flange Size: 21;24 Breast pump type: Manual Pump Education: Setup, frequency, and cleaning;Milk Storage Reason for Pumping: LC recommended prior to  latch, breast massage , hand express,  prepump and reverse pressure  Interventions Interventions: Breast feeding basics reviewed;Assisted with latch;Skin to skin;Breast massage;Hand express;Reverse pressure;Breast compression;Adjust position;Support pillows;Position options;Education;LC Services brochure  Discharge Discharge Education: Engorgement and breast care;Warning signs for feeding baby Pump: DEBP;Hands Free;Manual WIC Program: No  Consult Status Consult Status: Follow-up Date: 03/15/22 Follow-up type: In-patient    [place] 03/15/2022, 11:29 AM

## 2022-03-15 NOTE — Progress Notes (Addendum)
Post Partum Day 1 Subjective: no complaints, up ad lib, voiding, tolerating PO, + flatus, and lochia scant. She denies any dizziness, CP, SOB or HA. She is bonding well with baby and requests early discharge to home today after baby circumcised  Objective: Blood pressure 107/64, pulse 75, temperature 98.1 F (36.7 C), temperature source Oral, resp. rate 16, height 5\' 6"  (1.676 m), weight 81 kg, last menstrual period 04/15/2021, SpO2 100 %, unknown if currently breastfeeding.  Physical Exam:  General: alert, cooperative, and no distress Lochia: appropriate Uterine Fundus: firm, rash on abdomen Incision: n/a DVT Evaluation: No evidence of DVT seen on physical exam.  Recent Labs    03/14/22 0256 03/15/22 0629  HGB 11.0* 10.6*  HCT 32.5* 31.4*    Assessment/Plan: Discharge home, Breastfeeding, and Circumcision prior to discharge Lactation consultant in room Pt understands that neonatal circumcision is not considered medically necessary and is elective. The risks include, but are not limited to bleeding, infection, damage to the penis, development of scar tissue, and having to have it redone at a later date. Pt understands theses risks and wishes to proceed    LOS: 1 day   Sahiba Granholm W Syon Tews, DO 03/15/2022, 11:08 AM

## 2022-03-25 ENCOUNTER — Telehealth (HOSPITAL_COMMUNITY): Payer: Self-pay | Admitting: *Deleted

## 2022-03-25 NOTE — Telephone Encounter (Signed)
Left phone voicemail message.  Odis Hollingshead, RN 03-25-2022 at 10:51am

## 2022-04-26 DIAGNOSIS — Z1332 Encounter for screening for maternal depression: Secondary | ICD-10-CM | POA: Diagnosis not present

## 2022-04-26 DIAGNOSIS — Z30011 Encounter for initial prescription of contraceptive pills: Secondary | ICD-10-CM | POA: Diagnosis not present

## 2022-04-26 DIAGNOSIS — R1013 Epigastric pain: Secondary | ICD-10-CM | POA: Diagnosis not present

## 2022-04-27 ENCOUNTER — Other Ambulatory Visit: Payer: Self-pay | Admitting: Gynecology

## 2022-04-27 DIAGNOSIS — R1011 Right upper quadrant pain: Secondary | ICD-10-CM

## 2022-05-16 ENCOUNTER — Ambulatory Visit: Payer: BC Managed Care – PPO | Admitting: Family Medicine

## 2022-05-16 ENCOUNTER — Encounter: Payer: Self-pay | Admitting: Family Medicine

## 2022-05-16 VITALS — BP 103/67 | HR 113 | Temp 100.2°F | Ht 66.0 in | Wt 161.0 lb

## 2022-05-16 DIAGNOSIS — R509 Fever, unspecified: Secondary | ICD-10-CM

## 2022-05-16 DIAGNOSIS — K219 Gastro-esophageal reflux disease without esophagitis: Secondary | ICD-10-CM

## 2022-05-16 DIAGNOSIS — J4520 Mild intermittent asthma, uncomplicated: Secondary | ICD-10-CM | POA: Diagnosis not present

## 2022-05-16 DIAGNOSIS — Z86711 Personal history of pulmonary embolism: Secondary | ICD-10-CM | POA: Insufficient documentation

## 2022-05-16 DIAGNOSIS — J302 Other seasonal allergic rhinitis: Secondary | ICD-10-CM | POA: Diagnosis not present

## 2022-05-16 LAB — POC COVID19 BINAXNOW: SARS Coronavirus 2 Ag: NEGATIVE

## 2022-05-16 LAB — POCT INFLUENZA A/B
Influenza A, POC: NEGATIVE
Influenza B, POC: NEGATIVE

## 2022-05-16 MED ORDER — MONTELUKAST SODIUM 10 MG PO TABS: 10.0000 mg | ORAL_TABLET | Freq: Every day | ORAL | 3 refills | Status: DC

## 2022-05-16 MED ORDER — OLOPATADINE HCL 0.2 % OP SOLN: OPHTHALMIC | 11 refills | Status: DC

## 2022-05-16 MED ORDER — AZELASTINE HCL 0.1 % NA SOLN: 1.0000 | Freq: Two times a day (BID) | NASAL | 12 refills | Status: DC

## 2022-05-16 MED ORDER — FLUTICASONE PROPIONATE 50 MCG/ACT NA SUSP: 2.0000 | Freq: Two times a day (BID) | NASAL | 11 refills | Status: DC

## 2022-05-16 MED ORDER — ALBUTEROL SULFATE HFA 108 (90 BASE) MCG/ACT IN AERS: INHALATION_SPRAY | RESPIRATORY_TRACT | 1 refills | Status: DC

## 2022-05-16 MED ORDER — CETIRIZINE HCL 10 MG PO TABS: 10.0000 mg | ORAL_TABLET | Freq: Every day | ORAL | 3 refills | Status: DC

## 2022-05-16 MED ORDER — FAMOTIDINE 40 MG PO TABS: 40.0000 mg | ORAL_TABLET | Freq: Every day | ORAL | 1 refills | Status: DC

## 2022-05-16 MED ORDER — PANTOPRAZOLE SODIUM 40 MG PO TBEC: 40.0000 mg | DELAYED_RELEASE_TABLET | Freq: Every day | ORAL | 1 refills | Status: DC

## 2022-05-16 MED ORDER — QVAR REDIHALER 40 MCG/ACT IN AERB: 1.0000 | INHALATION_SPRAY | Freq: Two times a day (BID) | RESPIRATORY_TRACT | 0 refills | Status: DC

## 2022-05-16 NOTE — Patient Instructions (Addendum)
Return if symptoms worsen or fail to improve.        Great to see you today.  I have refilled the medication(s) we provide.   If labs were collected, we will inform you of lab results once received either by echart message or telephone call.   - echart message- for normal results that have been seen by the patient already.   - telephone call: abnormal results or if patient has not viewed results in their echart.   Adenovirus Infection, Adult Adenoviruses are common viruses that cause many types of infections. These viruses may affect the nose, throat, windpipe, and lungs (respiratory system). They can also affect the eyes, stomach, bowels, bladder, and brain. The most common type of adenovirus infection is the common cold. Most adenovirus infections are not severe. However, they can become severe in people who have another health problem that makes it hard to fight off infection. What are the causes? This condition is caused by an adenovirus entering your body. This can happen when: You touch a surface or object that has the virus on it and then touch your mouth, nose, or eyes with unwashed hands. You come in close physical contact with someone who has this type of infection. This may happen if you hug or shake hands with the person. You breathe in droplets that fly through the air when someone who has the infection talks, coughs, or sneezes. You have contact with stool (feces) that has the virus in it. You use a swimming pool that does not have enough chlorine in it. Chlorine is a chemical that kills germs. Adenoviruses can live outside the body for a long time. They spread easily from person to person (are contagious). What increases the risk? You are more likely to develop this condition if: You spend a lot of time in places where there are many people. These include schools, summer camps, day care centers, community centers, and training centers for people who join the [employer]. You  are an older adult. Your body has a weak defense system (immune system). You have a disease of the respiratory system. You have a heart condition. What are the signs or symptoms? Symptoms of this condition can take up to 14 days to appear. They are usually similar to symptoms of the flu. Common symptoms of this condition include: Lung and breathing problems, such as cough, trouble breathing, and stuffy (congested) or runny nose. Aches and pains, such as headache, stiff neck, sore throat, ear pain, congested ears, or stomachache. Nausea and vomiting. Diarrhea. Fever. Eye problems, such as a red, inflamed eye (pink eye or conjunctivitis). Rash. Less common symptoms include: Being confused or not knowing the time of day, where you are, or who you are (disoriented). Blood in your urine or pain when you urinate. How is this diagnosed? This condition may be diagnosed based on your symptoms and a physical exam. Your health care provider may order tests to make sure that your symptoms are not caused by another problem. Tests can include: Blood tests. Urine tests. Stool tests. Chest X-ray. Tests of tissue or mucus from your throat. How is this treated? This condition goes away on its own with time. Symptoms can be managed by: Getting plenty of rest. Drinking more fluids than usual. Taking over-the-counter medicine to help relieve a sore throat, fever, or headache. Follow these instructions at home:  Activity Rest at home until your symptoms go away. Return to your normal activities as told by your health care  provider. Ask your health care provider what activities are safe for you. Lifestyle Do not drink alcohol. Do not use any products that contain nicotine or tobacco. These products include cigarettes, chewing tobacco, and vaping devices, such as e-cigarettes. If you need help quitting, ask your health care provider. General instructions Drink enough fluid to keep your urine pale  yellow. Take over-the-counter and prescription medicines only as told by your health care provider. If you have a sore throat, gargle with a mixture of salt and water 3-4 times a day or as needed. To make salt water, completely dissolve -1 tsp (3-6 g) of salt in 1 cup (237 mL) of warm water. How is this prevented?     Adenoviruses often are not killed by common cleaning products. They also can stay on surfaces for a long time. To help prevent infection: Wash your hands often with soap and water for at least 20 seconds. If soap and water are not available, use hand sanitizer. Cover your mouth when you cough. Cover your nose and mouth when you sneeze. Do not touch your eyes, nose, or mouth with unwashed hands. Wash your hands after touching your face. Clean commonly used objects often. Do not use a swimming pool that does not have enough chlorine in it. Avoid close contact with people who are sick. Do not go to school or work when you are sick. Do not share cups or eating utensils. Where to find more information Centers for Disease Control and Prevention: [employer] Contact a health care provider if: Your symptoms stay the same after 10 days. Your symptoms get worse. You cannot eat or drink without vomiting. Get help right away if: You have trouble breathing or you are breathing fast. Your skin, lips, or fingernails look blue. You have fast or irregular heartbeats (palpitations). You become confused. You lose consciousness. These symptoms may be an emergency. Get help right away. Call 911. Do not wait to see if the symptoms will go away. Do not drive yourself to the hospital. Summary The most common type of adenovirus infection is the common cold. Adenoviruses can live outside the body for a long time. They spread easily from person to person (are contagious). This condition goes away on its own with time. Rest at home until your symptoms go away. Contact a health care provider if  your symptoms stay the same after 10 days or get worse. This information is not intended to replace advice given to you by your health care provider. Make sure you discuss any questions you have with your health care provider. Document Revised: 08/23/2021 Document Reviewed: 08/23/2021 Elsevier Patient Education  2023 [employer].

## 2022-05-16 NOTE — Progress Notes (Signed)
Alyssa Le , 1986/02/23, 36 y.o., female MRN: [id number] Patient Care Team    Relationship Specialty Notifications Start End  Natalia Leatherwood, DO PCP - General Family Medicine  04/27/16   Edwinna Areola, DO Consulting Physician Obstetrics and Gynecology  05/14/21     Chief Complaint  Patient presents with   Gastroesophageal Reflux    Worsen in the lat 2 mos;    Fever    pt c/o fever and congestion X 1 day     Subjective: Alyssa Le is a 36 y.o. female Pt presents for an OV to discuss   GERD: Patient reports she has taken over-the-counter Nexium in the past.  However over the last few months her reflux and heartburn have worsened.  She states she sometimes feels like her chest is on fire.  She recently had a baby in October.  She currently has been taking Protonix 40 mg daily.  Allergic rhinitis: She had discontinued all of her medications during her pregnancy.  Fever and congestion : daughter was ill, tested negative for flu and covid. Pt complains of fever Tmax 102.76F.  Nausea, headache, congestion.  She has not taken anything for his symptoms as of yet.  She is tolerating p.o. but does not have an appetite.  She reports her son has been playing with her neighbor who was recently diagnosed with an adenovirus.  She denies rash.  She is not breastfeeding.      05/16/2022   10:57 AM 05/14/2021    3:54 PM 10/06/2020    9:09 AM 02/15/2017    1:57 PM  Depression screen PHQ 2/9  Decreased Interest 0 0 0 0  Down, Depressed, Hopeless 0 0 0 0  PHQ - 2 Score 0 0 0 0    Allergies  Allergen Reactions   Amoxicillin-Pot Clavulanate Shortness Of Breath and Other (See Comments)    Reaction:  Chest pain  - pt cannot tolerate Augmentin - but can take Amoxicillin   Other Anaphylaxis and Other (See Comments)    Pt states that she is allergic to fresh fruit and tree nuts.     Tree Extract     Eye swelling   Cephalexin Rash   Latex Rash   Neosporin Original  [Bacitracin-Neomycin-Polymyxin] Rash   Perineal Cleansing [Balneol] Rash   Social History   Social History Narrative   Ms. Mortellaro is from [place]. She lives with her husband, 2 small children, and mother-in-law.She works part-time.  Patient is adopted.    Past Medical History:  Diagnosis Date   Acquired arm deformity 06/14/2013   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 Had surgery to lengthen bone when she was 36 y.o. R humerus is shorter than L, Has markedly decreased ROM in shoulder, had steroid injections in past w/minimal relief. Chronic R shoulder pain.   Allergy    multiple food & environmental allergies (tree pollens & fruits)   Anemia    Arthritis    exercise & allergy induced   Asthma    inhaler last used 2015   Closed displaced fracture of proximal phalanx of lesser toe of left foot 09/13/2016   Complication of anesthesia    post operative pulmonary edema   Diverticular disease of right side of colon 09/24/2014   Headache    History of postpartum hemorrhage    with last 2 deliveries   Hx of gastric ulcer    Kidney stone  Ovarian cyst, right    surgically removed. 36 y.o.   Stress incontinence of urine 05/18/2017   Past Surgical History:  Procedure Laterality Date   APPENDECTOMY     DILATATION & CURETTAGE/HYSTEROSCOPY WITH MYOSURE N/A 04/16/2021   Procedure: DILATATION & CURETTAGE/HYSTEROSCOPY WITH MYOSURE;  Surgeon: Sherlyn Hay, DO;  Location: [facility];  Service: Gynecology;  Laterality: N/A;   HUMERUS SURGERY Right    bone lengthening surgery 36 y.o due to osteomyelitis at growth plate as infant   OVARIAN CYST REMOVAL     stump appendectomy, Pulmonary edema post op   Family History  Adopted: Yes  Problem Relation Age of Onset   Alcohol abuse Neg Hx    Arthritis Neg Hx    Asthma Neg Hx    Birth defects Neg Hx    Cancer Neg Hx    COPD Neg Hx    Depression Neg Hx    Diabetes Neg Hx    Drug abuse Neg  Hx    Early death Neg Hx    Hearing loss Neg Hx    Heart disease Neg Hx    Hyperlipidemia Neg Hx    Hypertension Neg Hx    Kidney disease Neg Hx    Learning disabilities Neg Hx    Mental illness Neg Hx    Mental retardation Neg Hx    Miscarriages / Stillbirths Neg Hx    Stroke Neg Hx    Vision loss Neg Hx    Varicose Veins Neg Hx    Allergies as of 05/16/2022       Reactions   Amoxicillin-pot Clavulanate Shortness Of Breath, Other (See Comments)   Reaction:  Chest pain  - pt cannot tolerate Augmentin - but can take Amoxicillin   Other Anaphylaxis, Other (See Comments)   Pt states that she is allergic to fresh fruit and tree nuts.     Tree Extract    Eye swelling   Cephalexin Rash   Latex Rash   Neosporin Original [bacitracin-neomycin-polymyxin] Rash   Perineal Cleansing [balneol] Rash        Medication List        Accurate as of May 16, 2022 11:29 AM. If you have any questions, ask your nurse or doctor.          STOP taking these medications    ibuprofen 600 MG tablet Commonly known as: ADVIL Stopped by: Howard Pouch, DO   predniSONE 20 MG tablet Commonly known as: DELTASONE Stopped by: Howard Pouch, DO   prenatal multivitamin Tabs tablet Stopped by: Howard Pouch, DO       TAKE these medications    albuterol 108 (90 Base) MCG/ACT inhaler Commonly known as: VENTOLIN HFA Use 2 puffs twice daily for 4-6 days, then PRN q6h cough.   azelastine 0.1 % nasal spray Commonly known as: ASTELIN Place 1 spray into both nostrils 2 (two) times daily. Use in each nostril as directed   cetirizine 10 MG tablet Commonly known as: ZYRTEC Take 1 tablet (10 mg total) by mouth daily.   EPINEPHrine 0.3 mg/0.3 mL Soaj injection Commonly known as: EPI-PEN EpiPen 2-Pak 0.3 mg/0.3 mL injection, auto-injector   famotidine 40 MG tablet Commonly known as: Pepcid Take 1 tablet (40 mg total) by mouth daily. Started by: Howard Pouch, DO   fluticasone 50 MCG/ACT  nasal spray Commonly known as: FLONASE Place 2 sprays into both nostrils in the morning and at bedtime.   montelukast 10 MG tablet Commonly known as: SINGULAIR Take  1 tablet (10 mg total) by mouth at bedtime.   Olopatadine HCl 0.2 % Soln 1 drop each eye daily   pantoprazole 40 MG tablet Commonly known as: PROTONIX Take 1 tablet (40 mg total) by mouth daily.   Qvar RediHaler 40 MCG/ACT inhaler Generic drug: beclomethasone Inhale 1 puff into the lungs 2 (two) times daily.        All past medical history, surgical history, allergies, family history, immunizations andmedications were updated in the EMR today and reviewed under the history and medication portions of their EMR.     Review of Systems  Constitutional:  Positive for fever.  HENT:  Positive for congestion, sinus pain and sore throat. Negative for ear discharge and ear pain.   Respiratory:  Negative for sputum production.   Gastrointestinal:  Positive for nausea. Negative for abdominal pain, diarrhea and vomiting.  Neurological:  Positive for headaches. Negative for dizziness.   Negative, with the exception of above mentioned in HPI   Objective:  BP 103/67   Pulse (!) 113   Temp 100.2 F (37.9 C) (Oral)   Ht 5\' 6"  (1.676 m)   Wt 161 lb (73 kg)   LMP 04/15/2021 (Exact Date)   SpO2 97%   Breastfeeding No   BMI 25.99 kg/m  Body mass index is 25.99 kg/m. Physical Exam Vitals and nursing note reviewed.  Constitutional:      General: She is not in acute distress.    Appearance: Normal appearance. She is not ill-appearing, toxic-appearing or diaphoretic.     Comments: febrile  HENT:     Head: Normocephalic and atraumatic.     Right Ear: Tympanic membrane and ear canal normal.     Left Ear: Tympanic membrane and ear canal normal.     Nose: Congestion present. No rhinorrhea.     Mouth/Throat:     Pharynx: No oropharyngeal exudate or posterior oropharyngeal erythema.  Eyes:     General: No scleral icterus.        Right eye: No discharge.        Left eye: No discharge.     Extraocular Movements: Extraocular movements intact.     Conjunctiva/sclera: Conjunctivae normal.     Pupils: Pupils are equal, round, and reactive to light.  Cardiovascular:     Rate and Rhythm: Normal rate and regular rhythm.     Heart sounds: No murmur heard. Pulmonary:     Effort: Pulmonary effort is normal. No respiratory distress.     Breath sounds: Normal breath sounds. No wheezing, rhonchi or rales.  Musculoskeletal:     Right lower leg: No edema.     Left lower leg: No edema.  Skin:    Findings: No rash.  Neurological:     Mental Status: She is alert and oriented to person, place, and time. Mental status is at baseline.     Motor: No weakness.     Gait: Gait normal.  Psychiatric:        Mood and Affect: Mood normal.        Behavior: Behavior normal.        Thought Content: Thought content normal.        Judgment: Judgment normal.      No results found. No results found. Results for orders placed or performed in visit on 05/16/22 (from the past 24 hour(s))  POC COVID-19 BinaxNow     Status: Normal   Collection Time: 05/16/22 11:21 AM  Result Value Ref Range   SARS  Coronavirus 2 Ag Negative Negative  POCT Influenza A/B     Status: Normal   Collection Time: 05/16/22 11:22 AM  Result Value Ref Range   Influenza A, POC Negative Negative   Influenza B, POC Negative Negative    Assessment/Plan: ELLENDER BAILER is a 36 y.o. female present for OV for  Seasonal allergic rhinitis, unspecified trigger Restart Singulair throughout the year since her allergies are worsening. Continue OTC Zyrtec nightly as needed Restart Flonase  Restart Astelin   Mild intermittent asthma with exacerbation/Cough in adult Stable Continue Qvar 1 puff twice daily   GERD: Worsening since the end of pregnancy. Continue Protonix 40 mg daily Added Pepcid 40 mg daily If symptoms do not start to show improvement over the next  3 months, would consider GI referral at that time.  Viral syndrome: Patient likely has adenovirus. Rest, hydrate.  +/- flonase, mucinex (DM if cough), nettie pot or nasal saline.  It was educated on adenovirus, including a print out for her to read.  Encouraged her to work on hydration.  Symptoms should start to improve after 7 days. Follow-up if symptoms do not improve or rebound after 10 days.   Reviewed expectations re: course of current medical issues. Discussed self-management of symptoms. Outlined signs and symptoms indicating need for more acute intervention. Patient verbalized understanding and all questions were answered. Patient received an After-Visit Summary.    Orders Placed This Encounter  Procedures   POC COVID-19 BinaxNow   POCT Influenza A/B   Meds ordered this encounter  Medications   albuterol (VENTOLIN HFA) 108 (90 Base) MCG/ACT inhaler    Sig: Use 2 puffs twice daily for 4-6 days, then PRN q6h cough.    Dispense:  1 each    Refill:  1    May be any albuterol inhaler on formulary   azelastine (ASTELIN) 0.1 % nasal spray    Sig: Place 1 spray into both nostrils 2 (two) times daily. Use in each nostril as directed    Dispense:  30 mL    Refill:  12   fluticasone (FLONASE) 50 MCG/ACT nasal spray    Sig: Place 2 sprays into both nostrils in the morning and at bedtime.    Dispense:  16 g    Refill:  11   montelukast (SINGULAIR) 10 MG tablet    Sig: Take 1 tablet (10 mg total) by mouth at bedtime.    Dispense:  90 tablet    Refill:  3   Olopatadine HCl 0.2 % SOLN    Sig: 1 drop each eye daily    Dispense:  2.5 mL    Refill:  11   pantoprazole (PROTONIX) 40 MG tablet    Sig: Take 1 tablet (40 mg total) by mouth daily.    Dispense:  90 tablet    Refill:  1   beclomethasone (QVAR REDIHALER) 40 MCG/ACT inhaler    Sig: Inhale 1 puff into the lungs 2 (two) times daily.    Dispense:  1 each    Refill:  0   cetirizine (ZYRTEC) 10 MG tablet    Sig: Take 1  tablet (10 mg total) by mouth daily.    Dispense:  90 tablet    Refill:  3   famotidine (PEPCID) 40 MG tablet    Sig: Take 1 tablet (40 mg total) by mouth daily.    Dispense:  90 tablet    Refill:  1    Referral Orders  No referral(s) requested  today     Note is dictated utilizing voice recognition software. Although note has been proof read prior to signing, occasional typographical errors still can be missed. If any questions arise, please do not hesitate to call for verification.   electronically signed by:  Howard Pouch, DO  [place]

## 2022-05-17 ENCOUNTER — Encounter: Payer: Self-pay | Admitting: Family Medicine

## 2022-05-17 ENCOUNTER — Telehealth: Payer: Self-pay

## 2022-05-17 NOTE — Telephone Encounter (Signed)
Faxed received asking [place] to consider the following medication instead of Qvar.   Advair HFA [place] Asmanex Budesonide Flovent Flovent HFA Pulmicort Flexhaler   Please advise.

## 2022-05-18 ENCOUNTER — Other Ambulatory Visit: Payer: BC Managed Care – PPO

## 2022-05-18 NOTE — Telephone Encounter (Addendum)
Patient did not the inhaler right now. It is if her asthma flares/seasonal.  Therefore, I recommend we wait on the inhaler- since formulary may change again in Jan.  Please let patient know.

## 2022-05-19 NOTE — Telephone Encounter (Signed)
LVM for pt to CB regarding medication.  ?

## 2022-05-20 NOTE — Telephone Encounter (Signed)
Pt informed

## 2022-05-26 ENCOUNTER — Ambulatory Visit
Admission: RE | Admit: 2022-05-26 | Discharge: 2022-05-26 | Disposition: A | Discharge status: Home / Self Care | Payer: BC Managed Care – PPO | Source: Ambulatory Visit | Attending: Gynecology | Admitting: Gynecology

## 2022-05-26 DIAGNOSIS — R1011 Right upper quadrant pain: Secondary | ICD-10-CM

## 2022-06-21 ENCOUNTER — Other Ambulatory Visit: Payer: Self-pay | Admitting: Family Medicine

## 2022-06-23 ENCOUNTER — Other Ambulatory Visit (HOSPITAL_COMMUNITY): Payer: Self-pay

## 2022-06-23 NOTE — Telephone Encounter (Signed)
Per test claim, Pulmicort Flexhaler preferred. Ran test claim, received paid claim. Please change if appropriate or should we continue PA?

## 2022-06-24 MED ORDER — BUDESONIDE 90 MCG/ACT IN AEPB: 1.0000 | INHALATION_SPRAY | Freq: Two times a day (BID) | RESPIRATORY_TRACT | 5 refills | Status: DC

## 2022-06-24 NOTE — Addendum Note (Signed)
Addended by: Beatrix Fetters on: 06/24/2022 02:12 PM   Modules accepted: Orders

## 2022-07-28 DIAGNOSIS — M25571 Pain in right ankle and joints of right foot: Secondary | ICD-10-CM | POA: Diagnosis not present

## 2022-08-23 ENCOUNTER — Ambulatory Visit: Payer: BC Managed Care – PPO | Admitting: Family Medicine

## 2022-08-23 DIAGNOSIS — R300 Dysuria: Secondary | ICD-10-CM | POA: Diagnosis not present

## 2022-08-23 DIAGNOSIS — Z6825 Body mass index (BMI) 25.0-25.9, adult: Secondary | ICD-10-CM | POA: Diagnosis not present

## 2022-08-24 ENCOUNTER — Ambulatory Visit: Payer: BC Managed Care – PPO | Admitting: Family Medicine

## 2022-11-17 DIAGNOSIS — M5136 Other intervertebral disc degeneration, lumbar region: Secondary | ICD-10-CM | POA: Diagnosis not present

## 2022-11-17 DIAGNOSIS — M545 Low back pain, unspecified: Secondary | ICD-10-CM | POA: Diagnosis not present

## 2022-11-22 ENCOUNTER — Ambulatory Visit (INDEPENDENT_AMBULATORY_CARE_PROVIDER_SITE_OTHER): Payer: BC Managed Care – PPO | Admitting: Family Medicine

## 2022-11-22 VITALS — BP 108/68 | HR 84 | Temp 98.0°F | Wt 158.4 lb

## 2022-11-22 DIAGNOSIS — M5136 Other intervertebral disc degeneration, lumbar region: Secondary | ICD-10-CM | POA: Diagnosis not present

## 2022-11-22 DIAGNOSIS — M533 Sacrococcygeal disorders, not elsewhere classified: Secondary | ICD-10-CM

## 2022-11-22 MED ORDER — DICLOFENAC SODIUM ER 100 MG PO TB24: 100.0000 mg | ORAL_TABLET | Freq: Every day | ORAL | 2 refills | Status: DC

## 2022-11-22 NOTE — Progress Notes (Unsigned)
Alyssa Le , 1985/08/13, 37 y.o., female MRN: [id number] Patient Care Team    Relationship Specialty Notifications Start End  Natalia Leatherwood, DO PCP - General Family Medicine  04/27/16   Edwinna Areola, DO Consulting Physician Obstetrics and Gynecology  05/14/21     Chief Complaint  Patient presents with   Back Pain    Went UC on wed night; off [facility] ([facility]) 06/13 in [facility]; lower back pain started after last pregnancy ( 8 months ago)     Subjective: Alyssa Le is a 37 y.o. Pt presents for an OV with complaints of back pain of 8 months ago after last pregnancy. Low back pain radiates to right leg mostly, can be either leg though. Seen at UC last week - dx lumbar degeneration and  Prescribed naproxen, muscle relaxer and prednisone taper. She had been taking advil prior to UC and it was not helping. Since UC she reports she has seen an improvement, but pain is still present. Pain started after the delivery of her 5th child about 8 months ago. No injure prior or currently. Lumbar xray during UC visit  11/17/2022 IMPRESSION: 1. No acute fracture or traumatic malalignment. 2. Mild degenerative disc changes, most pronounced at L5-S1. 3. Grade 1 anterolisthesis at L5-S1.      11/22/2022   11:06 AM 05/16/2022   10:57 AM 05/14/2021    3:54 PM 10/06/2020    9:09 AM 02/15/2017    1:57 PM  Depression screen PHQ 2/9  Decreased Interest 0 0 0 0 0  Down, Depressed, Hopeless 0 0 0 0 0  PHQ - 2 Score 0 0 0 0 0    Allergies  Allergen Reactions   Amoxicillin-Pot Clavulanate Shortness Of Breath and Other (See Comments)    Reaction:  Chest pain  - pt cannot tolerate Augmentin - but can take Amoxicillin   Other Anaphylaxis and Other (See Comments)    Pt states that she is allergic to fresh fruit and tree nuts.     Tree Extract     Eye swelling   Cephalexin Rash   Latex Rash   Neosporin Original [Bacitracin-Neomycin-Polymyxin] Rash   Perineal Cleansing [Balneol]  Rash   Social History   Social History Narrative   Alyssa Le is from [place]. She lives with her husband, 2 small children, and mother-in-law.She works part-time.  Patient is adopted.    Past Medical History:  Diagnosis Date   Acquired arm deformity 06/14/2013   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 Had surgery to lengthen bone when she was 37 y.o. R humerus is shorter than L, Has markedly decreased ROM in shoulder, had steroid injections in past w/minimal relief. Chronic R shoulder pain.   Allergy    multiple food & environmental allergies (tree pollens & fruits)   Anemia    Arthritis    exercise & allergy induced   Asthma    inhaler last used 2015   Closed displaced fracture of proximal phalanx of lesser toe of left foot 09/13/2016   Complication of anesthesia    post operative pulmonary edema   Diverticular disease of right side of colon 09/24/2014   Headache    History of postpartum hemorrhage    with last 2 deliveries   Hx of gastric ulcer    Kidney stone    Ovarian cyst, right    surgically removed. 37 y.o.  Stress incontinence of urine 05/18/2017   Past Surgical History:  Procedure Laterality Date   APPENDECTOMY     DILATATION & CURETTAGE/HYSTEROSCOPY WITH MYOSURE N/A 04/16/2021   Procedure: DILATATION & CURETTAGE/HYSTEROSCOPY WITH MYOSURE;  Surgeon: Edwinna Areola, DO;  Location: [facility] OR;  Service: Gynecology;  Laterality: N/A;   HUMERUS SURGERY Right    bone lengthening surgery 37 y.o due to osteomyelitis at growth plate as infant   OVARIAN CYST REMOVAL     stump appendectomy, Pulmonary edema post op   Family History  Adopted: Yes  Problem Relation Age of Onset   Alcohol abuse Neg Hx    Arthritis Neg Hx    Asthma Neg Hx    Birth defects Neg Hx    Cancer Neg Hx    COPD Neg Hx    Depression Neg Hx    Diabetes Neg Hx    Drug abuse Neg Hx    Early death Neg Hx    Hearing loss Neg Hx    Heart disease  Neg Hx    Hyperlipidemia Neg Hx    Hypertension Neg Hx    Kidney disease Neg Hx    Learning disabilities Neg Hx    Mental illness Neg Hx    Mental retardation Neg Hx    Miscarriages / Stillbirths Neg Hx    Stroke Neg Hx    Vision loss Neg Hx    Varicose Veins Neg Hx    Allergies as of 11/22/2022       Reactions   Amoxicillin-pot Clavulanate Shortness Of Breath, Other (See Comments)   Reaction:  Chest pain  - pt cannot tolerate Augmentin - but can take Amoxicillin   Other Anaphylaxis, Other (See Comments)   Pt states that she is allergic to fresh fruit and tree nuts.     Tree Extract    Eye swelling   Cephalexin Rash   Latex Rash   Neosporin Original [bacitracin-neomycin-polymyxin] Rash   Perineal Cleansing [balneol] Rash        Medication List        Accurate as of November 22, 2022 11:27 AM. If you have any questions, ask your nurse or doctor.          STOP taking these medications    azelastine 0.1 % nasal spray Commonly known as: ASTELIN Stopped by: Felix Pacini, DO   Budesonide 90 MCG/ACT inhaler Stopped by: Felix Pacini, DO   cetirizine 10 MG tablet Commonly known as: ZYRTEC Stopped by: Felix Pacini, DO   famotidine 40 MG tablet Commonly known as: Pepcid Stopped by: Felix Pacini, DO   fluticasone 50 MCG/ACT nasal spray Commonly known as: FLONASE Stopped by: Felix Pacini, DO   montelukast 10 MG tablet Commonly known as: SINGULAIR Stopped by: Felix Pacini, DO   Olopatadine HCl 0.2 % Soln Stopped by: Felix Pacini, DO   pantoprazole 40 MG tablet Commonly known as: PROTONIX Stopped by: Felix Pacini, DO       TAKE these medications    albuterol 108 (90 Base) MCG/ACT inhaler Commonly known as: VENTOLIN HFA Use 2 puffs twice daily for 4-6 days, then PRN q6h cough.   Diclofenac Sodium CR 100 MG 24 hr tablet Take 1 tablet (100 mg total) by mouth daily. With food Started by: Felix Pacini, DO   EPINEPHrine 0.3 mg/0.3 mL Soaj injection Commonly  known as: EPI-PEN EpiPen 2-Pak 0.3 mg/0.3 mL injection, auto-injector        All past medical history, surgical history, allergies, family history,  immunizations andmedications were updated in the EMR today and reviewed under the history and medication portions of their EMR.     ROS Negative, with the exception of above mentioned in HPI   Objective:  BP 108/68   Pulse 84   Temp 98 F (36.7 C)   Wt 158 lb 6.4 oz (71.8 kg)   LMP 11/11/2022   SpO2 98%   Breastfeeding No   BMI 25.57 kg/m  Body mass index is 25.57 kg/m. Physical Exam ***  No results found. No results found. No results found for this or any previous visit (from the past 24 hour(s)).  Assessment/Plan: Alyssa Le is a 37 y.o. female present for OV for  Lumbar degenerative disc disease/ Sacroiliac joint dysfunction of right side *** - Ambulatory referral to Physical Therapy - Ambulatory referral to Sports Medicine  Reviewed expectations re: course of current medical issues. Discussed self-management of symptoms. Outlined signs and symptoms indicating need for more acute intervention. Patient verbalized understanding and all questions were answered. Patient received an After-Visit Summary.    Orders Placed This Encounter  Procedures   Ambulatory referral to Physical Therapy   Ambulatory referral to Sports Medicine   Meds ordered this encounter  Medications   Diclofenac Sodium CR 100 MG 24 hr tablet    Sig: Take 1 tablet (100 mg total) by mouth daily. With food    Dispense:  30 tablet    Refill:  2   Referral Orders         Ambulatory referral to Physical Therapy         Ambulatory referral to Sports Medicine       Note is dictated utilizing voice recognition software. Although note has been proof read prior to signing, occasional typographical errors still can be missed. If any questions arise, please do not hesitate to call for verification.   electronically signed by:  Felix Pacini,  DO   Primary Care - [place]

## 2022-11-24 ENCOUNTER — Ambulatory Visit: Payer: BC Managed Care – PPO | Admitting: Family Medicine

## 2022-11-24 ENCOUNTER — Telehealth: Payer: Self-pay | Admitting: Family Medicine

## 2022-11-24 VITALS — BP 107/71 | HR 78 | Temp 98.0°F | Wt 156.2 lb

## 2022-11-24 DIAGNOSIS — R300 Dysuria: Secondary | ICD-10-CM

## 2022-11-24 DIAGNOSIS — R311 Benign essential microscopic hematuria: Secondary | ICD-10-CM | POA: Diagnosis not present

## 2022-11-24 LAB — POC URINALSYSI DIPSTICK (AUTOMATED)
Bilirubin, UA: NEGATIVE
Glucose, UA: NEGATIVE
Ketones, UA: NEGATIVE
Nitrite, UA: NEGATIVE
Protein, UA: POSITIVE — AB
Spec Grav, UA: 1.025 (ref 1.010–1.025)
Urobilinogen, UA: 0.2 E.U./dL
pH, UA: 6 (ref 5.0–8.0)

## 2022-11-24 MED ORDER — NITROFURANTOIN MONOHYD MACRO 100 MG PO CAPS: 100.0000 mg | ORAL_CAPSULE | Freq: Two times a day (BID) | ORAL | 0 refills | Status: DC

## 2022-11-24 NOTE — Progress Notes (Signed)
Alyssa Le , May 27, 1986, 37 y.o., female MRN: [id number] Patient Care Team    Relationship Specialty Notifications Start End  Alyssa Leatherwood, DO PCP - General Family Medicine  04/27/16   Edwinna Areola, DO Consulting Physician Obstetrics and Gynecology  05/14/21     Chief Complaint  Patient presents with   Urinary Frequency    Burning; bladder fullness     Subjective: Alyssa Le is a 37 y.o. Pt presents for an OV with complaints of with urination and incomplete emptying of bladder of 2 days duration.  Patient denies fevers or chills.  She was seen earlier this week for back pain, back pain was mostly SI joint related and not consistent with CVA tenderness. She does however have a history of kidney stones.  She denies decreased urinary output.  She is currently not on her menstrual cycle.  She had a UTI in March that was E. coli sensitive to Bactrim, Macrobid and fluoroquinolone.  She was treated with Macrobid and felt her symptoms had improved.     11/22/2022   11:06 AM 05/16/2022   10:57 AM 05/14/2021    3:54 PM 10/06/2020    9:09 AM 02/15/2017    1:57 PM  Depression screen PHQ 2/9  Decreased Interest 0 0 0 0 0  Down, Depressed, Hopeless 0 0 0 0 0  PHQ - 2 Score 0 0 0 0 0    Allergies  Allergen Reactions   Amoxicillin-Pot Clavulanate Shortness Of Breath and Other (See Comments)    Reaction:  Chest pain  - pt cannot tolerate Augmentin - but can take Amoxicillin   Other Anaphylaxis and Other (See Comments)    Pt states that she is allergic to fresh fruit and tree nuts.     Tree Extract     Eye swelling   Cephalexin Rash   Latex Rash   Neosporin Original [Bacitracin-Neomycin-Polymyxin] Rash   Perineal Cleansing [Balneol] Rash   Social History   Social History Narrative   Ms. Zaheer is from [place]. She lives with her husband, 2 small children, and mother-in-law.She works part-time.  Patient is adopted.    Past Medical History:  Diagnosis Date    Acquired arm deformity 06/14/2013   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 Had surgery to lengthen bone when she was 37 y.o. R humerus is shorter than L, Has markedly decreased ROM in shoulder, had steroid injections in past w/minimal relief. Chronic R shoulder pain.   Allergy    multiple food & environmental allergies (tree pollens & fruits)   Anemia    Arthritis    exercise & allergy induced   Asthma    inhaler last used 2015   Closed displaced fracture of proximal phalanx of lesser toe of left foot 09/13/2016   Complication of anesthesia    post operative pulmonary edema   Diverticular disease of right side of colon 09/24/2014   Headache    History of postpartum hemorrhage    with last 2 deliveries   Hx of gastric ulcer    Kidney stone    Ovarian cyst, right    surgically removed. 37 y.o.   Stress incontinence of urine 05/18/2017   Past Surgical History:  Procedure Laterality Date   APPENDECTOMY     DILATATION & CURETTAGE/HYSTEROSCOPY WITH MYOSURE N/A 04/16/2021   Procedure: DILATATION & CURETTAGE/HYSTEROSCOPY WITH MYOSURE;  Surgeon: Edwinna Areola, DO;  Location:  [facility] OR;  Service: Gynecology;  Laterality: N/A;   HUMERUS SURGERY Right    bone lengthening surgery 37 y.o due to osteomyelitis at growth plate as infant   OVARIAN CYST REMOVAL     stump appendectomy, Pulmonary edema post op   Family History  Adopted: Yes  Problem Relation Age of Onset   Alcohol abuse Neg Hx    Arthritis Neg Hx    Asthma Neg Hx    Birth defects Neg Hx    Cancer Neg Hx    COPD Neg Hx    Depression Neg Hx    Diabetes Neg Hx    Drug abuse Neg Hx    Early death Neg Hx    Hearing loss Neg Hx    Heart disease Neg Hx    Hyperlipidemia Neg Hx    Hypertension Neg Hx    Kidney disease Neg Hx    Learning disabilities Neg Hx    Mental illness Neg Hx    Mental retardation Neg Hx    Miscarriages / Stillbirths Neg Hx    Stroke Neg Hx    Vision  loss Neg Hx    Varicose Veins Neg Hx    Allergies as of 11/24/2022       Reactions   Amoxicillin-pot Clavulanate Shortness Of Breath, Other (See Comments)   Reaction:  Chest pain  - pt cannot tolerate Augmentin - but can take Amoxicillin   Other Anaphylaxis, Other (See Comments)   Pt states that she is allergic to fresh fruit and tree nuts.     Tree Extract    Eye swelling   Cephalexin Rash   Latex Rash   Neosporin Original [bacitracin-neomycin-polymyxin] Rash   Perineal Cleansing [balneol] Rash        Medication List        Accurate as of November 24, 2022  9:19 AM. If you have any questions, ask your nurse or doctor.          albuterol 108 (90 Base) MCG/ACT inhaler Commonly known as: VENTOLIN HFA Use 2 puffs twice daily for 4-6 days, then PRN q6h cough.   Diclofenac Sodium CR 100 MG 24 hr tablet Take 1 tablet (100 mg total) by mouth daily. With food   EPINEPHrine 0.3 mg/0.3 mL Soaj injection Commonly known as: EPI-PEN EpiPen 2-Pak 0.3 mg/0.3 mL injection, auto-injector   nitrofurantoin (macrocrystal-monohydrate) 100 MG capsule Commonly known as: Macrobid Take 1 capsule (100 mg total) by mouth 2 (two) times daily. Started by: Felix Pacini, DO        All past medical history, surgical history, allergies, family history, immunizations andmedications were updated in the EMR today and reviewed under the history and medication portions of their EMR.     ROS Negative, with the exception of above mentioned in HPI   Objective:  BP 107/71   Pulse 78   Temp 98 F (36.7 C)   Wt 156 lb 3.2 oz (70.9 kg)   LMP 11/11/2022   SpO2 99%   BMI 25.21 kg/m  Body mass index is 25.21 kg/m. Physical Exam Vitals and nursing note reviewed.  Constitutional:      General: She is not in acute distress.    Appearance: Normal appearance. She is normal weight. She is not ill-appearing or toxic-appearing.  HENT:     Head: Normocephalic and atraumatic.  Eyes:     General: No  scleral icterus.       Right eye: No discharge.  Left eye: No discharge.     Extraocular Movements: Extraocular movements intact.     Conjunctiva/sclera: Conjunctivae normal.     Pupils: Pupils are equal, round, and reactive to light.  Abdominal:     General: There is no distension.     Palpations: Abdomen is soft.     Tenderness: There is no abdominal tenderness. There is no right CVA tenderness or left CVA tenderness.  Skin:    Findings: No rash.  Neurological:     Mental Status: She is alert and oriented to person, place, and time. Mental status is at baseline.     Motor: No weakness.     Coordination: Coordination normal.     Gait: Gait normal.  Psychiatric:        Mood and Affect: Mood normal.        Behavior: Behavior normal.        Thought Content: Thought content normal.        Judgment: Judgment normal.      No results found. No results found. Results for orders placed or performed in visit on 11/24/22 (from the past 24 hour(s))  POCT Urinalysis Dipstick (Automated)     Status: Abnormal   Collection Time: 11/24/22  9:09 AM  Result Value Ref Range   Color, UA dark yellow    Clarity, UA clear    Glucose, UA Negative Negative   Bilirubin, UA Negative    Ketones, UA Negative    Spec Grav, UA 1.025 1.010 - 1.025   Blood, UA 3+    pH, UA 6.0 5.0 - 8.0   Protein, UA Positive (A) Negative   Urobilinogen, UA 0.2 0.2 or 1.0 E.U./dL   Nitrite, UA Negative    Leukocytes, UA Small (1+) (A) Negative    Assessment/Plan: BIDDIE FASSBENDER is a 37 y.o. female present for OV for  Dysuria/hematuria - POCT Urinalysis Dipstick (Automated)-small leuks and positive blood 3+. - Urinalysis w microscopic + reflex cultur-pending Given her allergy list, elected to treat with Macrobid while we wait on urine culture. Patient was encouraged to hydrate well and she can take OTC Azo for comfort. Patient will be called with culture results and further plan discussed at that time. She  understands if she experiences flank or abdominal pain, fevers or chills, nausea vomiting or decreased urinary output she should report to the emergency room for to have kidney stone workup urgently.  Reviewed expectations re: course of current medical issues. Discussed self-management of symptoms. Outlined signs and symptoms indicating need for more acute intervention. Patient verbalized understanding and all questions were answered. Patient received an After-Visit Summary.    Orders Placed This Encounter  Procedures   Urinalysis w microscopic + reflex cultur   POCT Urinalysis Dipstick (Automated)   Meds ordered this encounter  Medications   nitrofurantoin, macrocrystal-monohydrate, (MACROBID) 100 MG capsule    Sig: Take 1 capsule (100 mg total) by mouth 2 (two) times daily.    Dispense:  14 capsule    Refill:  0   Referral Orders  No referral(s) requested today     Note is dictated utilizing voice recognition software. Although note has been proof read prior to signing, occasional typographical errors still can be missed. If any questions arise, please do not hesitate to call for verification.   electronically signed by:  Felix Pacini, DO  [facility] - [place]

## 2022-11-24 NOTE — Patient Instructions (Signed)
No follow-ups on file.        Great to see you today.  I have refilled the medication(s) we provide.   If labs were collected, we will inform you of lab results once received either by echart message or telephone call.   - echart message- for normal results that have been seen by the patient already.   - telephone call: abnormal results or if patient has not viewed results in their echart.  

## 2022-11-26 LAB — URINALYSIS W MICROSCOPIC + REFLEX CULTURE
Bacteria, UA: NONE SEEN /HPF
Bilirubin Urine: NEGATIVE
Glucose, UA: NEGATIVE
Hyaline Cast: NONE SEEN /LPF
Ketones, ur: NEGATIVE
Nitrites, Initial: NEGATIVE
RBC / HPF: >=60 /HPF — AB (ref 0–2)
Specific Gravity, Urine: 1.013 (ref 1.001–1.035)
Squamous Epithelial / HPF: NONE SEEN /HPF (ref <=5)
WBC, UA: >=60 /HPF — AB (ref 0–5)
pH: 6.5 (ref 5.0–8.0)

## 2022-11-26 LAB — URINE CULTURE
MICRO NUMBER:: 15111064
SPECIMEN QUALITY:: ADEQUATE

## 2022-11-26 LAB — CULTURE INDICATED

## 2023-02-02 ENCOUNTER — Ambulatory Visit (INDEPENDENT_AMBULATORY_CARE_PROVIDER_SITE_OTHER): Payer: BC Managed Care – PPO | Admitting: Family Medicine

## 2023-02-02 ENCOUNTER — Encounter: Payer: Self-pay | Admitting: Family Medicine

## 2023-02-02 VITALS — BP 104/68 | HR 78 | Temp 97.8°F | Resp 17 | Wt 156.0 lb

## 2023-02-02 DIAGNOSIS — M5441 Lumbago with sciatica, right side: Secondary | ICD-10-CM

## 2023-02-02 DIAGNOSIS — M5442 Lumbago with sciatica, left side: Secondary | ICD-10-CM

## 2023-02-02 MED ORDER — METHYLPREDNISOLONE ACETATE 80 MG/ML IJ SUSP
80.0000 mg | Freq: Once | INTRAMUSCULAR | Status: AC
  Administered 2023-02-02: 80 mg via INTRAMUSCULAR

## 2023-02-02 MED ORDER — PREDNISONE 10 MG PO TABS: ORAL_TABLET | ORAL | 0 refills | Status: DC

## 2023-02-02 NOTE — Patient Instructions (Signed)
Follow up as needed or as scheduled START the Prednisone as directed- 3 pills at the same time x3 days, then 2 pills at the same time x3 days, then 1 pill daily.  Take w/ food starting tomorrow Continue to take Tylenol but avoid any additional ibuprofen Continue to use the pain patches and heating pad Call with any questions or concerns Hang in there!!!

## 2023-02-02 NOTE — Progress Notes (Signed)
   Subjective:    Patient ID: Alyssa Le, female    DOB: 1986/02/12, 37 y.o.   MRN: [id number]  HPI Back pain- pt has hx of back pain that started a few months ago.  Was seen at [facility] and then by PCP.  Sxs started Monday.  No known injury.  No change in routine.  Does have baby to lift.  Pain is in low back.  Pain is R > L.  Will have radiation of pain into butt or leg.  Nothing provides relief- no improvement w/ patches or ibuprofen or heating pad.   Review of Systems For ROS see HPI     Objective:   Physical Exam Vitals reviewed.  Constitutional:      Appearance: She is not ill-appearing.     Comments: Very uncomfortable  HENT:     Head: Normocephalic and atraumatic.  Cardiovascular:     Pulses: Normal pulses.  Musculoskeletal:        General: No swelling, tenderness or deformity.  Skin:    General: Skin is warm and dry.  Neurological:     General: No focal deficit present.     Mental Status: She is alert and oriented to person, place, and time.     Gait: Gait abnormal (antalgic).     Comments: + SLR bilaterally  Psychiatric:        Mood and Affect: Mood normal.        Behavior: Behavior normal.        Thought Content: Thought content normal.           Assessment & Plan:  Low back pain w/ sciatica- new.  Pt reports she has recently had hx of similar and was holding off on PT until kids went back to school.  Sxs started earlier this week and states there was no inciting event or trigger.  No relief w/ OTC medications, pain patches, or heat.  Will give Depomedrol in office to jump start anti-inflammatory response and start Prednisone taper tomorrow.  Pt expressed understanding and is in agreement w/ plan.

## 2023-02-13 ENCOUNTER — Ambulatory Visit: Payer: BC Managed Care – PPO | Admitting: Family Medicine

## 2023-02-13 ENCOUNTER — Encounter: Payer: Self-pay | Admitting: Family Medicine

## 2023-02-13 VITALS — BP 106/68 | HR 80 | Temp 98.1°F | Resp 18 | Wt 156.1 lb

## 2023-02-13 DIAGNOSIS — R300 Dysuria: Secondary | ICD-10-CM | POA: Diagnosis not present

## 2023-02-13 DIAGNOSIS — M5136 Other intervertebral disc degeneration, lumbar region: Secondary | ICD-10-CM | POA: Diagnosis not present

## 2023-02-13 DIAGNOSIS — Z23 Encounter for immunization: Secondary | ICD-10-CM | POA: Diagnosis not present

## 2023-02-13 LAB — POCT URINALYSIS DIPSTICK
Blood, UA: POSITIVE
Glucose, UA: NEGATIVE
Leukocytes, UA: NEGATIVE
Protein, UA: NEGATIVE
Spec Grav, UA: 1.025 (ref 1.010–1.025)
Urobilinogen, UA: 0.2 U/dL
pH, UA: 6 (ref 5.0–8.0)

## 2023-02-13 MED ORDER — NITROFURANTOIN MONOHYD MACRO 100 MG PO CAPS: 100.0000 mg | ORAL_CAPSULE | Freq: Two times a day (BID) | ORAL | 0 refills | Status: DC

## 2023-02-13 MED ORDER — MELOXICAM 15 MG PO TABS: 15.0000 mg | ORAL_TABLET | Freq: Every day | ORAL | 1 refills | Status: DC

## 2023-02-13 NOTE — Progress Notes (Signed)
   Subjective:    Patient ID: [name], female    DOB: Jan 17, 1986, 37 y.o.   MRN: [id number]  HPI Dysuria- pt recently completed Prednisone taper.  Since Saturday she has had increased frequency  and burning.  No blood in urine.  Took Azo this weekend.  Over the weekend felt like she was having bladder spasms.  + urgency.  Back pain- pt reports sxs improved w/ Prednisone but now that she is not taking medication, pain is starting to come back.  Not currently on NSAIDs.  Has Diclofenac at home but is not taking.   Review of Systems For ROS see HPI     Objective:   Physical Exam Vitals reviewed.  Constitutional:      General: She is not in acute distress.    Appearance: Normal appearance. She is not ill-appearing.  HENT:     Head: Normocephalic and atraumatic.  Cardiovascular:     Rate and Rhythm: Normal rate and regular rhythm.  Pulmonary:     Effort: Pulmonary effort is normal. No respiratory distress.  Abdominal:     General: There is no distension.     Palpations: Abdomen is soft.     Tenderness: There is no abdominal tenderness. There is no right CVA tenderness, left CVA tenderness or guarding.  Skin:    General: Skin is warm and dry.  Neurological:     General: No focal deficit present.     Mental Status: She is alert and oriented to person, place, and time.  Psychiatric:        Mood and Affect: Mood normal.        Behavior: Behavior normal.        Thought Content: Thought content normal.           Assessment & Plan:  Burning w/ urination- new.  Pt's sxs are consistent w/ UTI.  UA shows blood.  Will start macrobid as pt is allergic to Keflex.  Encouraged increased water intake.  Will adjust medication based on culture result.  Pt expressed understanding and is in agreement w/ plan.   Lumbar degenerative disc disease- ongoing.  Sciatica improved w/ Prednisone taper.  Unfortunately now that she is done w/ the medication, she feels her sxs are returning.  Will  start daily Meloxicam in hopes of staying ahead of the pain.  Refer to sports med for complete evaluation and tx.  Pt expressed understanding and is in agreement w/ plan.

## 2023-02-13 NOTE — Patient Instructions (Addendum)
Follow up as needed or as scheduled START the Macrobid twice daily for suspected UTI Drink LOTS of water to flush your system START the Meloxicam once daily w/ food for back pain and inflammation We'll call you to schedule your Sports Med appt for the back pain Call with any questions or concerns Hang in there!!

## 2023-02-14 NOTE — Progress Notes (Unsigned)
[facility] [address] [place] [address] Phone: [phone]   Assessment and Plan:    1. Chronic bilateral low back pain with bilateral sciatica 2. DDD (degenerative disc disease), lumbosacral  -Chronic with exacerbation, initial sports medicine visit - Most consistent with flare of lumbosacral DDD at L5-S1 as seen on x-ray imaging leading to centralized low back pain and occasionally bilateral radicular symptoms, typically worse on right.  Radicular symptoms are not reproducible on physical exam today.  Symptoms likely originally caused by patient's fifth pregnancy, generalized weak and core and low back musculature - Start HEP and physical therapy for low back and core - Start meloxicam 15 mg daily x2 weeks.  If still having pain after 2 weeks, complete 3rd-week of meloxicam. May use remaining meloxicam as needed once daily for pain control.  Do not to use additional NSAIDs while taking meloxicam.  May use Tylenol [phone] mg 2 to 3 times a day for breakthrough pain.  Patient was already prescribed meloxicam, but has not started medication. - Patient had mild temporary relief with prednisone courses in the past - X-ray obtained in clinic.  My interpretation: No acute fracture or vertebral collapse.  Decreased disc space between L5 and S1 - DG Lumbar Spine 2-3 Views; Future - Ambulatory referral to Physical Therapy  15 additional minutes spent for educating Therapeutic Home Exercise Program.  This included exercises focusing on stretching, strengthening, with focus on eccentric aspects.   Long term goals include an improvement in range of motion, strength, endurance as well as avoiding reinjury. Patient's frequency would include in 1-2 times a day, 3-5 times a week for a duration of 6-12 weeks. Proper technique shown and discussed handout in great detail with ATC.  All questions were discussed and answered.     Pertinent previous  records reviewed include none   Follow Up: 4 weeks for reevaluation.  Could consider advanced imaging versus OMT based on patient's presentation   Subjective:   I, [name], am serving as a Neurosurgeon for Doctor Richardean Sale  Chief Complaint: low back pain   HPI:   02/15/2023 Patient is a 37 year old female complaining of low back pain. Patient states - pt has hx of back pain that started a few months ago. Was seen at [facility] and then by PCP. Marland Kitchen No known injury. No change in routine. Does have baby to lift. Pain radiates into butt or leg. Nothing provides relief- no improvement w/ patches or ibuprofen or heating pad.   Sciatica improved w/ Prednisone taper.  Unfortunately now that she is done w/ the medication, she feels her sxs are returning.  Will start daily Meloxicam has picked up hasn't started yet .   Relevant Historical Information: GERD  Additional pertinent review of systems negative.   Current Outpatient Medications:    albuterol (VENTOLIN HFA) 108 (90 Base) MCG/ACT inhaler, Use 2 puffs twice daily for 4-6 days, then PRN q6h cough., Disp: 1 each, Rfl: 1   EPINEPHrine 0.3 mg/0.3 mL IJ SOAJ injection, EpiPen 2-Pak 0.3 mg/0.3 mL injection, auto-injector, Disp: , Rfl:    meloxicam (MOBIC) 15 MG tablet, Take 1 tablet (15 mg total) by mouth daily., Disp: 30 tablet, Rfl: 1   nitrofurantoin, macrocrystal-monohydrate, (MACROBID) 100 MG capsule, Take 1 capsule (100 mg total) by mouth 2 (two) times daily., Disp: 14 capsule, Rfl: 0   predniSONE (DELTASONE) 10 MG tablet, 3 tabs x3 days and then 2 tabs x3 days  and then 1 tab x3 days.  Take w/ food., Disp: 18 tablet, Rfl: 0   Objective:     Vitals:   02/15/23 1114  BP: 122/82  Pulse: 79  SpO2: 98%  Weight: 156 lb (70.8 kg)  Height: 5\' 6"  (1.676 m)      Body mass index is 25.18 kg/m.    Physical Exam:    Gen: Appears well, nad, nontoxic and pleasant Psych: Alert and oriented, appropriate mood and affect Neuro: sensation  intact, strength is 5/5 in upper and lower extremities, muscle tone wnl Skin: no susupicious lesions or rashes  Back - Normal skin, Spine with normal alignment and no deformity.   No tenderness to vertebral process palpation.   Paraspinous muscles are not tender and without spasm NTTP gluteal musculature Straight leg raise negative Trendelenberg negative Piriformis Test negative Gait normal  Centralize low back pain with lumbar extension  Electronically signed by:  Alyssa Le [facility] 11:41 AM 02/15/23

## 2023-02-15 ENCOUNTER — Ambulatory Visit (INDEPENDENT_AMBULATORY_CARE_PROVIDER_SITE_OTHER): Payer: BC Managed Care – PPO

## 2023-02-15 ENCOUNTER — Ambulatory Visit (INDEPENDENT_AMBULATORY_CARE_PROVIDER_SITE_OTHER): Payer: BC Managed Care – PPO | Admitting: Sports Medicine

## 2023-02-15 VITALS — BP 122/82 | HR 79 | Ht 66.0 in | Wt 156.0 lb

## 2023-02-15 DIAGNOSIS — G8929 Other chronic pain: Secondary | ICD-10-CM | POA: Diagnosis not present

## 2023-02-15 DIAGNOSIS — M5441 Lumbago with sciatica, right side: Secondary | ICD-10-CM

## 2023-02-15 DIAGNOSIS — M5442 Lumbago with sciatica, left side: Secondary | ICD-10-CM

## 2023-02-15 DIAGNOSIS — M47817 Spondylosis without myelopathy or radiculopathy, lumbosacral region: Secondary | ICD-10-CM | POA: Diagnosis not present

## 2023-02-15 DIAGNOSIS — M545 Low back pain, unspecified: Secondary | ICD-10-CM | POA: Diagnosis not present

## 2023-02-15 DIAGNOSIS — M5137 Other intervertebral disc degeneration, lumbosacral region: Secondary | ICD-10-CM | POA: Diagnosis not present

## 2023-02-15 NOTE — Patient Instructions (Signed)
-   Start meloxicam 15 mg daily x2 weeks.  If still having pain after 2 weeks, complete 3rd-week of meloxicam. May use remaining meloxicam as needed once daily for pain control.  Do not to use additional NSAIDs while taking meloxicam.  May use Tylenol [phone] mg 2 to 3 times a day for breakthrough pain. Low back HEP PT referral   4 week follow up

## 2023-02-16 ENCOUNTER — Telehealth: Payer: Self-pay

## 2023-02-16 LAB — URINE CULTURE
MICRO NUMBER:: 15441109
SPECIMEN QUALITY:: ADEQUATE

## 2023-02-16 NOTE — Telephone Encounter (Signed)
-----   Message from Neena Rhymes sent at 02/16/2023 10:08 AM EDT ----- Your UTI is being treated appropriately with the Macrobid.  I hope you are feeling better!

## 2023-02-16 NOTE — Telephone Encounter (Signed)
Pt seen results Via my chart  

## 2023-02-21 ENCOUNTER — Encounter: Payer: Self-pay | Admitting: Physical Therapy

## 2023-02-21 ENCOUNTER — Ambulatory Visit (INDEPENDENT_AMBULATORY_CARE_PROVIDER_SITE_OTHER): Payer: BC Managed Care – PPO | Admitting: Physical Therapy

## 2023-02-21 DIAGNOSIS — M5459 Other low back pain: Secondary | ICD-10-CM | POA: Diagnosis not present

## 2023-02-21 NOTE — Therapy (Signed)
OUTPATIENT PHYSICAL THERAPY LOWER EXTREMITY EVALUATION   Patient Name: Alyssa Le MRN: [id number] DOB:02-19-1986, 37 y.o., female Today's Date: 02/21/2023  END OF SESSION:  PT End of Session - 02/21/23 0929     Visit Number 1    Number of Visits 16    Date for PT Re-Evaluation 04/18/23    Authorization Type BCBS    PT Start Time [phone]    PT Stop Time 0920    PT Time Calculation (min) 31 min    Activity Tolerance Patient tolerated treatment well    Behavior During Therapy [facility] for tasks assessed/performed             Past Medical History:  Diagnosis Date   Acquired arm deformity 06/14/2013   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 Had surgery to lengthen bone when she was 37 y.o. R humerus is shorter than L, Has markedly decreased ROM in shoulder, had steroid injections in past w/minimal relief. Chronic R shoulder pain.   Allergy    multiple food & environmental allergies (tree pollens & fruits)   Anemia    Arthritis    exercise & allergy induced   Asthma    inhaler last used 2015   Closed displaced fracture of proximal phalanx of lesser toe of left foot 09/13/2016   Complication of anesthesia    post operative pulmonary edema   Diverticular disease of right side of colon 09/24/2014   Headache    History of postpartum hemorrhage    with last 2 deliveries   Hx of gastric ulcer    Kidney stone    Ovarian cyst, right    surgically removed. 37 y.o.   Stress incontinence of urine 05/18/2017   Past Surgical History:  Procedure Laterality Date   APPENDECTOMY     DILATATION & CURETTAGE/HYSTEROSCOPY WITH MYOSURE N/A 04/16/2021   Procedure: DILATATION & CURETTAGE/HYSTEROSCOPY WITH MYOSURE;  Surgeon: Edwinna Areola, DO;  Location: [facility] OR;  Service: Gynecology;  Laterality: N/A;   HUMERUS SURGERY Right    bone lengthening surgery 37 y.o due to osteomyelitis at growth plate as infant   OVARIAN CYST REMOVAL     stump  appendectomy, Pulmonary edema post op   Patient Active Problem List   Diagnosis Date Noted   History of pulmonary embolism 05/16/2022   Eczema 10/06/2020   Seasonal allergic rhinitis 09/02/2020   Asthma 09/02/2020   GERD without esophagitis 01/26/2018     PCP: Felix Pacini  REFERRING PROVIDER: Richardean Sale  REFERRING DIAG: Low back pain  THERAPY DIAG:  Other low back pain  Rationale for Evaluation and Treatment: Rehabilitation  ONSET DATE: June 2024   SUBJECTIVE:   SUBJECTIVE STATEMENT: Pt states onset of Low back pain in June. No injury to report, but had significant pain and some pain down R leg. She had steroids at that time that helped. She had another episode at end of August, and had another round of steroids. Pain has calmed down, but she is very afraid that it will return. She has not done much movement or activity at all, due to fear of pain. She Likes to do yard work and wood work, but has not done.  Exercise: not at this time, does have peloton.  States pain in center of low back, very deep. She has an 60 mo old, which is her 5th child. No c-sections.Did have some back pain with pregnancy, and states it did not resolve.  PERTINENT HISTORY: 5 pregnancies.   PAIN:  Are you having pain? Yes: NPRS scale: 0-3/10 Pain location: center, low back Pain description: sore, severe pain when flared up.  Aggravating factors: none stated Relieving factors: none stated    PRECAUTIONS: None   WEIGHT BEARING RESTRICTIONS: No   FALLS:  Has patient fallen in last 6 months? No   PLOF: Independent  PATIENT GOALS:  Decreased pain   NEXT MD VISIT:   OBJECTIVE:   DIAGNOSTIC FINDINGS:   PATIENT SURVEYS:  Foto: back:    COGNITION: Overall cognitive status: Within functional limits for tasks assessed     SENSATION: WFL  EDEMA:    POSTURE:    No Significant postural limitations  PALPATION:  mild soreness in central low lumbar L3,4/5   LOWER  EXTREMITY ROM:  Lumbar: Flexion: mild limitation, fearful,  Extension:  mild/mod limitation/fearful,  SB:  WFL Hips: WFL,   LOWER EXTREMITY MMT:  MMT Left eval Right  eval  Hip flexion 4 4  Hip extension    Hip abduction 4 4  Hip adduction    Hip internal rotation    Hip external rotation    Knee flexion 5 5  Knee extension 5 5  Ankle dorsiflexion    Ankle plantarflexion    Ankle inversion    Ankle eversion     (Blank rows = not tested)  LOWER EXTREMITY SPECIAL TESTS:  Neg SLR    GAIT: Unremarkable    TODAY'S TREATMENT:                                                                                                                              DATE:   02/21/2023  Therapeutic Exercise: Aerobic: Supine:   bridge x 10, slow with cueing for mechanics;   TA x 10;  Seated: Standing: Stretches:   SKTC 20 sec x 3 bil;  LTR /slow x 10;   Neuromuscular Re-education: Manual Therapy: Therapeutic Activity: Self Care:   PATIENT EDUCATION:  Education details: PT POC, Exam findings, HEP Person educated: Patient Education method: Explanation, Demonstration, Tactile cues, Verbal cues, and Handouts Education comprehension: verbalized understanding, returned demonstration, verbal cues required, tactile cues required, and needs further education   HOME EXERCISE PROGRAM: Access Code: [email or web address] URL: [email or web address] Date: 02/21/2023 Prepared by: Sedalia Muta  Exercises - Hooklying Single Knee to Chest Stretch  - 2 x daily - 3 reps - 30 hold - Supine Transversus Abdominis Bracing - Hands on Ground  - 2 x daily - 1-2 sets - 10 reps - Supine Bridge  - 1 x daily - 1 sets - 10 reps - Supine Lower Trunk Rotation  - 1-2 x daily - 10 reps - 5 hold  ASSESSMENT:  CLINICAL IMPRESSION: Patient presents with primary complaint of  pain in low back, with 2 episodes of severe pain. She has decreased lumbar ROM, and moderate fear of movement for these motions. She has  decreased strength and stability in  hips and core,and had baby less than 1 yr ago. She has poor movement quality and difficulty with child care due to pain. Pt with decreased ability for full functional activities, ADLs, IADLs. Pt will  benefit from skilled PT to improve deficits and pain and to return to PLOF.   OBJECTIVE IMPAIRMENTS: decreased activity tolerance, decreased mobility, decreased ROM, decreased strength, increased muscle spasms, impaired flexibility, improper body mechanics, and pain.   ACTIVITY LIMITATIONS: carrying, lifting, bending, standing, squatting, stairs, hygiene/grooming, locomotion level, and caring for others  PARTICIPATION LIMITATIONS: meal prep, cleaning, laundry, driving, shopping, community activity, and yard work  PERSONAL FACTORS:  none  are also affecting patient's functional outcome.   REHAB POTENTIAL: Good  CLINICAL DECISION MAKING: Stable/uncomplicated  EVALUATION COMPLEXITY: Low   GOALS: Goals reviewed with patient? Yes  SHORT TERM GOALS: Target date: 03/07/2023   Pt to be independent with initial HEP  Goal status: INITIAL  2.  Pt to demo improved ease and fear with movement for lumbar ROM, flex and extension.   Goal status: INITIAL     LONG TERM GOALS: Target date: 04/18/2023   Pt to be independent with final HEP  Goal status: INITIAL  2.  Pt to demo full lumbar ROM to be [facility] and pain free.   Goal status: INITIAL  3.  Pt to demo strength and stability of hips and core to be [facility] for pt age.   Goal status: INITIAL  4.  Pt to demo ability and optimal mechanics for bend, lift, squat motion, to improve ability for child care and IADLs.   Goal status: INITIAL    PLAN:  PT FREQUENCY: 1-2x/week  PT DURATION: 8 weeks  PLANNED INTERVENTIONS: Therapeutic exercises, Therapeutic activity, Neuromuscular re-education, Patient/Family education, Self Care, Joint mobilization, Joint manipulation, Stair training, Orthotic/Fit training,  DME instructions, Aquatic Therapy, Dry Needling, Electrical stimulation, Cryotherapy, Moist heat, Taping, Ultrasound, Ionotophoresis 4mg /ml Dexamethasone, Manual therapy,  Vasopneumatic device, Traction, Spinal manipulation, Spinal mobilization,Balance training, Gait training,   PLAN FOR NEXT SESSION:   Sedalia Muta, PT, DPT 10:36 AM  02/21/23

## 2023-02-23 ENCOUNTER — Ambulatory Visit (INDEPENDENT_AMBULATORY_CARE_PROVIDER_SITE_OTHER): Payer: BC Managed Care – PPO | Admitting: Physical Therapy

## 2023-02-23 ENCOUNTER — Encounter: Payer: Self-pay | Admitting: Physical Therapy

## 2023-02-23 DIAGNOSIS — M5459 Other low back pain: Secondary | ICD-10-CM

## 2023-02-23 NOTE — Therapy (Addendum)
OUTPATIENT PHYSICAL THERAPY LOWER EXTREMITY TREATMENT   Patient Name: Alyssa Le MRN: [id number] DOB:[birth year], 37 y.o., female Today's Date: 02/23/2023  END OF SESSION:  PT End of Session - 02/23/23 1102     Visit Number 2    Number of Visits 16    Date for PT Re-Evaluation 04/18/23    Authorization Type BCBS    PT Start Time 1104    PT Stop Time 1152    PT Time Calculation (min) 48 min    Activity Tolerance Patient tolerated treatment well    Behavior During Therapy [facility] for tasks assessed/performed             Past Medical History:  Diagnosis Date   Acquired arm deformity 06/14/2013   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 Had surgery to lengthen bone when she was 37 y.o. R humerus is shorter than L, Has markedly decreased ROM in shoulder, had steroid injections in past w/minimal relief. Chronic R shoulder pain.   Allergy    multiple food & environmental allergies (tree pollens & fruits)   Anemia    Arthritis    exercise & allergy induced   Asthma    inhaler last used 2015   Closed displaced fracture of proximal phalanx of lesser toe of left foot 09/13/2016   Complication of anesthesia    post operative pulmonary edema   Diverticular disease of right side of colon 09/24/2014   Headache    History of postpartum hemorrhage    with last 2 deliveries   Hx of gastric ulcer    Kidney stone    Ovarian cyst, right    surgically removed. 37 y.o.   Stress incontinence of urine 05/18/2017   Past Surgical History:  Procedure Laterality Date   APPENDECTOMY     DILATATION & CURETTAGE/HYSTEROSCOPY WITH MYOSURE N/A 04/16/2021   Procedure: DILATATION & CURETTAGE/HYSTEROSCOPY WITH MYOSURE;  Surgeon: Edwinna Areola, DO;  Location: [facility] OR;  Service: Gynecology;  Laterality: N/A;   HUMERUS SURGERY Right    bone lengthening surgery 37 y.o due to osteomyelitis at growth plate as infant   OVARIAN CYST REMOVAL     stump  appendectomy, Pulmonary edema post op   Patient Active Problem List   Diagnosis Date Noted   History of pulmonary embolism 05/16/2022   Eczema 10/06/2020   Seasonal allergic rhinitis 09/02/2020   Asthma 09/02/2020   GERD without esophagitis 01/26/2018     PCP: Felix Pacini  REFERRING PROVIDER: Richardean Sale  REFERRING DIAG: Low back pain  THERAPY DIAG:  Other low back pain  Rationale for Evaluation and Treatment: Rehabilitation  ONSET DATE: June 2024   SUBJECTIVE:   SUBJECTIVE STATEMENT:  02/23/2023  Pt states mild soreness in center, low back, She stopped taking meloxicam because it was bothering her stomach.   Eval: Pt states onset of Low back pain in June. No injury to report, but had significant pain and some pain down R leg. She had steroids at that time that helped. She had another episode at end of August, and had another round of steroids. Pain has calmed down, but she is very afraid that it will return. She has not done much movement or activity at all, due to fear of pain. She Likes to do yard work and wood work, but has not done.  Exercise: not at this time, does have peloton.  States pain in center of low back, very deep. She has an  77 mo old, which is her 5th child. No c-sections.Did have some back pain with pregnancy, and states it did not resolve.   PERTINENT HISTORY: 5 pregnancies.   PAIN:  Are you having pain? Yes: NPRS scale: 0-3/10 Pain location: center, low back Pain description: sore, severe pain when flared up.  Aggravating factors: none stated Relieving factors: none stated    PRECAUTIONS: None   WEIGHT BEARING RESTRICTIONS: No   FALLS:  Has patient fallen in last 6 months? No   PLOF: Independent  PATIENT GOALS:  Decreased pain   NEXT MD VISIT:   OBJECTIVE:   DIAGNOSTIC FINDINGS:   PATIENT SURVEYS:  Foto: initial: visit 2:  73   COGNITION: Overall cognitive status: Within functional limits for tasks  assessed     SENSATION: WFL  EDEMA:    POSTURE:    No Significant postural limitations  PALPATION:  mild soreness in central low lumbar L3,4/5   LOWER EXTREMITY ROM:  Lumbar: Flexion: mild limitation, fearful,  Extension:  mild/mod limitation/fearful,  SB:  WFL Hips: WFL,   LOWER EXTREMITY MMT:  MMT Left eval Right  eval  Hip flexion 4 4  Hip extension    Hip abduction 4 4  Hip adduction    Hip internal rotation    Hip external rotation    Knee flexion 5 5  Knee extension 5 5  Ankle dorsiflexion    Ankle plantarflexion    Ankle inversion    Ankle eversion     (Blank rows = not tested)  LOWER EXTREMITY SPECIAL TESTS:  Neg SLR    GAIT: Unremarkable    TODAY'S TREATMENT:                                                                                                                              DATE:    02/23/2023 Therapeutic Exercise: Aerobic: Supine:   bridge x 2 - pain in back on L,   TA x 10;   TA with march x 15;  Seated: Standing: Stretches:   SKTC 20 sec x 3 bil;  LTR /slow x 10;  pelvic tilts 2 x 10  Neuromuscular Re-education: Manual Therapy: long leg distraction on L for lumbar pump ,  STM to L lumbar paraspinals (prone on 1 pillow )  Therapeutic Activity: Self Care: Modalities: Moist heat pack to lumbar region, in sitting, x 10 min, education on home use.    PATIENT EDUCATION:  Education details: updated and reviewed HEP Person educated: Patient Education method: Explanation, Demonstration, Tactile cues, Verbal cues, and Handouts Education comprehension: verbalized understanding, returned demonstration, verbal cues required, tactile cues required, and needs further education   HOME EXERCISE PROGRAM: Access Code: [email or web address] URL: [email or web address] Date: 02/21/2023 Prepared by: Sedalia Muta  Exercises - Hooklying Single Knee to Chest Stretch  - 2 x daily - 3 reps - 30 hold - Supine Transversus Abdominis Bracing - Hands  on Ground  - 2 x daily -  1-2 sets - 10 reps - Supine Bridge  - 1 x daily - 1 sets - 10 reps - Supine Lower Trunk Rotation  - 1-2 x daily - 10 reps - 5 hold  ASSESSMENT:  CLINICAL IMPRESSION: 02/23/2023 Pt with improved ROM with much less apprehension today for Lumbar flexion and SB, as well as with ther ex. Still has slight soreness in central low back with extension. She has painful location at L upper lumbar region with manual today. She has mild soreness in back today overall. She has stopped taking meloxicam. Discussed keeping activities light and using heating pad as needed.   Eval: Patient presents with primary complaint of  pain in low back, with 2 episodes of severe pain. She has decreased lumbar ROM, and moderate fear of movement for these motions. She has decreased strength and stability in hips and core,and had baby less than 1 yr ago. She has poor movement quality and difficulty with child care due to pain. Pt with decreased ability for full functional activities, ADLs, IADLs. Pt will  benefit from skilled PT to improve deficits and pain and to return to PLOF.   OBJECTIVE IMPAIRMENTS: decreased activity tolerance, decreased mobility, decreased ROM, decreased strength, increased muscle spasms, impaired flexibility, improper body mechanics, and pain.   ACTIVITY LIMITATIONS: carrying, lifting, bending, standing, squatting, stairs, hygiene/grooming, locomotion level, and caring for others  PARTICIPATION LIMITATIONS: meal prep, cleaning, laundry, driving, shopping, community activity, and yard work  PERSONAL FACTORS:  none  are also affecting patient's functional outcome.   REHAB POTENTIAL: Good  CLINICAL DECISION MAKING: Stable/uncomplicated  EVALUATION COMPLEXITY: Low   GOALS: Goals reviewed with patient? Yes  SHORT TERM GOALS: Target date: 03/07/2023   Pt to be independent with initial HEP  Goal status: INITIAL  2.  Pt to demo improved ease and fear with movement for  lumbar ROM, flex and extension.   Goal status: INITIAL     LONG TERM GOALS: Target date: 04/18/2023   Pt to be independent with final HEP  Goal status: INITIAL  2.  Pt to demo full lumbar ROM to be [facility] and pain free.   Goal status: INITIAL  3.  Pt to demo strength and stability of hips and core to be [facility] for pt age.   Goal status: INITIAL  4.  Pt to demo ability and optimal mechanics for bend, lift, squat motion, to improve ability for child care and IADLs.   Goal status: INITIAL    PLAN:  PT FREQUENCY: 1-2x/week  PT DURATION: 8 weeks  PLANNED INTERVENTIONS: Therapeutic exercises, Therapeutic activity, Neuromuscular re-education, Patient/Family education, Self Care, Joint mobilization, Joint manipulation, Stair training, Orthotic/Fit training, DME instructions, Aquatic Therapy, Dry Needling, Electrical stimulation, Cryotherapy, Moist heat, Taping, Ultrasound, Ionotophoresis 4mg /ml Dexamethasone, Manual therapy,  Vasopneumatic device, Traction, Spinal manipulation, Spinal mobilization,Balance training, Gait training,   PLAN FOR NEXT SESSION:   Sedalia Muta, PT, DPT 11:45 AM  02/23/23

## 2023-03-02 ENCOUNTER — Ambulatory Visit: Payer: BC Managed Care – PPO | Admitting: Physical Therapy

## 2023-03-02 ENCOUNTER — Encounter: Payer: Self-pay | Admitting: Physical Therapy

## 2023-03-02 DIAGNOSIS — M5459 Other low back pain: Secondary | ICD-10-CM | POA: Diagnosis not present

## 2023-03-02 NOTE — Therapy (Signed)
OUTPATIENT PHYSICAL THERAPY LOWER EXTREMITY TREATMENT   Patient Name: Alyssa Le MRN: [id number] DOB:June 28, 1985, 37 y.o., female Today's Date: 03/02/2023  END OF SESSION:  PT End of Session - 03/02/23 1046     Visit Number 3    Number of Visits 16    Date for PT Re-Evaluation 04/18/23    Authorization Type BCBS    PT Start Time [phone]    PT Stop Time 0930    PT Time Calculation (min) 43 min    Activity Tolerance Patient tolerated treatment well    Behavior During Therapy [facility] for tasks assessed/performed              Past Medical History:  Diagnosis Date   Acquired arm deformity 06/14/2013   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 Had surgery to lengthen bone when she was 37 y.o. R humerus is shorter than L, Has markedly decreased ROM in shoulder, had steroid injections in past w/minimal relief. Chronic R shoulder pain.   Allergy    multiple food & environmental allergies (tree pollens & fruits)   Anemia    Arthritis    exercise & allergy induced   Asthma    inhaler last used 2015   Closed displaced fracture of proximal phalanx of lesser toe of left foot 09/13/2016   Complication of anesthesia    post operative pulmonary edema   Diverticular disease of right side of colon 09/24/2014   Headache    History of postpartum hemorrhage    with last 2 deliveries   Hx of gastric ulcer    Kidney stone    Ovarian cyst, right    surgically removed. 37 y.o.   Stress incontinence of urine 05/18/2017   Past Surgical History:  Procedure Laterality Date   APPENDECTOMY     DILATATION & CURETTAGE/HYSTEROSCOPY WITH MYOSURE N/A 04/16/2021   Procedure: DILATATION & CURETTAGE/HYSTEROSCOPY WITH MYOSURE;  Surgeon: Edwinna Areola, DO;  Location: [facility] OR;  Service: Gynecology;  Laterality: N/A;   HUMERUS SURGERY Right    bone lengthening surgery 37 y.o due to osteomyelitis at growth plate as infant   OVARIAN CYST REMOVAL     stump  appendectomy, Pulmonary edema post op   Patient Active Problem List   Diagnosis Date Noted   History of pulmonary embolism 05/16/2022   Eczema 10/06/2020   Seasonal allergic rhinitis 09/02/2020   Asthma 09/02/2020   GERD without esophagitis 01/26/2018     PCP: Felix Pacini  REFERRING PROVIDER: Richardean Sale  REFERRING DIAG: Low back pain  THERAPY DIAG:  Other low back pain  Rationale for Evaluation and Treatment: Rehabilitation  ONSET DATE: June 2024   SUBJECTIVE:   SUBJECTIVE STATEMENT:  03/02/2023  Pt states mild soreness in center, low back, She had some increased pain for a day or so, but not too bad today. She is doing better, but also some worry for having another flare up.   Eval: Pt states onset of Low back pain in June. No injury to report, but had significant pain and some pain down R leg. She had steroids at that time that helped. She had another episode at end of August, and had another round of steroids. Pain has calmed down, but she is very afraid that it will return. She has not done much movement or activity at all, due to fear of pain. She Likes to do yard work and wood work, but has not done.  Exercise: not  at this time, does have peloton.  States pain in center of low back, very deep. She has an 58 mo old, which is her 5th child. No c-sections.Did have some back pain with pregnancy, and states it did not resolve.   PERTINENT HISTORY: 5 pregnancies.   PAIN:  Are you having pain? Yes: NPRS scale: 0-3/10 Pain location: center, low back Pain description: sore, severe pain when flared up.  Aggravating factors: none stated Relieving factors: none stated    PRECAUTIONS: None   WEIGHT BEARING RESTRICTIONS: No   FALLS:  Has patient fallen in last 6 months? No   PLOF: Independent  PATIENT GOALS:  Decreased pain   NEXT MD VISIT:   OBJECTIVE:   DIAGNOSTIC FINDINGS:   PATIENT SURVEYS:  Foto: initial: visit 2:  53   COGNITION: Overall  cognitive status: Within functional limits for tasks assessed     SENSATION: WFL  EDEMA:    POSTURE:    No Significant postural limitations  PALPATION:  mild soreness in central low lumbar L3,4/5   LOWER EXTREMITY ROM:  03/02/23: R shoulder screen: severe joint limitation to about 90 deg of flexion. Tightness in surrounding musculature  in scapular and lat on R.   Lumbar: Flexion: mild limitation, fearful,  Extension:  mild/mod limitation/fearful,  SB:  WFL Hips: WFL,   LOWER EXTREMITY MMT:  MMT Left eval Right  eval  Hip flexion 4 4  Hip extension    Hip abduction 4 4  Hip adduction    Hip internal rotation    Hip external rotation    Knee flexion 5 5  Knee extension 5 5  Ankle dorsiflexion    Ankle plantarflexion    Ankle inversion    Ankle eversion     (Blank rows = not tested)  LOWER EXTREMITY SPECIAL TESTS:  Neg SLR    GAIT: Unremarkable    TODAY'S TREATMENT:                                                                                                                              DATE:    03/02/2023 Therapeutic Exercise: Aerobic: Supine:     TA x 10;   TA with march x 15;   SLR x 10 ea bil with TA;  Seated: Standing:  paloff press 1 BTB x 15 bil;  Stretches:  Piriformis 30 sec x 3 bil;   Cat cow x 10 ( R hand on towel)   Neuromuscular Re-education: Manual Therapy: long leg distraction on L for lumbar pump ,   Therapeutic Activity: Self Care: Other: ambulation for assessment of gait: Slight increase in R SB (likely baseline)    Therapeutic Exercise: Aerobic: Supine:   bridge x 2 - pain in back on L,   TA x 10;   TA with march x 15;  Seated: Standing: Stretches:   SKTC 20 sec x 3 bil;  LTR /slow x 10;  pelvic tilts 2 x  10  Neuromuscular Re-education: Manual Therapy: long leg distraction on L for lumbar pump ,  STM to L lumbar paraspinals (prone on 1 pillow )  Therapeutic Activity: Self Care: Modalities: Moist heat pack to lumbar region, in  sitting, x 10 min, education on home use.    PATIENT EDUCATION:  Education details: updated and reviewed HEP Person educated: Patient Education method: Explanation, Demonstration, Tactile cues, Verbal cues, and Handouts Education comprehension: verbalized understanding, returned demonstration, verbal cues required, tactile cues required, and needs further education   HOME EXERCISE PROGRAM: Access Code: [email or web address]   ASSESSMENT:  CLINICAL IMPRESSION: 03/02/2023  Pt with noted/mild gait asymmetry, with increased SB to R with R initial contact. Also screened R shoulder, pt with baseline stiffness, very limited joint motion, to about 90 deg from surgery as a child. She has limited arm swing on this side, and some increased muscle tension in lat and scapular region from shoulder. Gait deficit seems to be more from this vs having a lateral shift from back pain.  Pt with improved ROM for all motions, and was able to progress light strength today. Plan to progress as tolerated.   Eval: Patient presents with primary complaint of  pain in low back, with 2 episodes of severe pain. She has decreased lumbar ROM, and moderate fear of movement for these motions. She has decreased strength and stability in hips and core,and had baby less than 1 yr ago. She has poor movement quality and difficulty with child care due to pain. Pt with decreased ability for full functional activities, ADLs, IADLs. Pt will  benefit from skilled PT to improve deficits and pain and to return to PLOF.   OBJECTIVE IMPAIRMENTS: decreased activity tolerance, decreased mobility, decreased ROM, decreased strength, increased muscle spasms, impaired flexibility, improper body mechanics, and pain.   ACTIVITY LIMITATIONS: carrying, lifting, bending, standing, squatting, stairs, hygiene/grooming, locomotion level, and caring for others  PARTICIPATION LIMITATIONS: meal prep, cleaning, laundry, driving, shopping, community activity, and yard  work  PERSONAL FACTORS:  none  are also affecting patient's functional outcome.   REHAB POTENTIAL: Good  CLINICAL DECISION MAKING: Stable/uncomplicated  EVALUATION COMPLEXITY: Low   GOALS: Goals reviewed with patient? Yes  SHORT TERM GOALS: Target date: 03/07/2023   Pt to be independent with initial HEP  Goal status: INITIAL  2.  Pt to demo improved ease and fear with movement for lumbar ROM, flex and extension.   Goal status: INITIAL     LONG TERM GOALS: Target date: 04/18/2023   Pt to be independent with final HEP  Goal status: INITIAL  2.  Pt to demo full lumbar ROM to be [facility] and pain free.   Goal status: INITIAL  3.  Pt to demo strength and stability of hips and core to be [facility] for pt age.   Goal status: INITIAL  4.  Pt to demo ability and optimal mechanics for bend, lift, squat motion, to improve ability for child care and IADLs.   Goal status: INITIAL    PLAN:  PT FREQUENCY: 1-2x/week  PT DURATION: 8 weeks  PLANNED INTERVENTIONS: Therapeutic exercises, Therapeutic activity, Neuromuscular re-education, Patient/Family education, Self Care, Joint mobilization, Joint manipulation, Stair training, Orthotic/Fit training, DME instructions, Aquatic Therapy, Dry Needling, Electrical stimulation, Cryotherapy, Moist heat, Taping, Ultrasound, Ionotophoresis 4mg /ml Dexamethasone, Manual therapy,  Vasopneumatic device, Traction, Spinal manipulation, Spinal mobilization,Balance training, Gait training,   PLAN FOR NEXT SESSION:   Sedalia Muta, PT, DPT 10:51 AM  03/02/23

## 2023-03-07 ENCOUNTER — Encounter: Payer: BC Managed Care – PPO | Admitting: Physical Therapy

## 2023-03-09 ENCOUNTER — Encounter: Payer: BC Managed Care – PPO | Admitting: Physical Therapy

## 2023-03-14 ENCOUNTER — Encounter: Payer: Self-pay | Admitting: Physical Therapy

## 2023-03-14 ENCOUNTER — Ambulatory Visit (INDEPENDENT_AMBULATORY_CARE_PROVIDER_SITE_OTHER): Payer: BC Managed Care – PPO | Admitting: Physical Therapy

## 2023-03-14 DIAGNOSIS — M5459 Other low back pain: Secondary | ICD-10-CM

## 2023-03-14 NOTE — Progress Notes (Unsigned)
    Alyssa Le D.[name] [address] [place] [address] Phone: [phone]   Assessment and Plan:     There are no diagnoses linked to this encounter.  ***   Pertinent previous records reviewed include ***   Follow Up: ***     Subjective:   I, Alyssa Le, am serving as a Neurosurgeon for Doctor Richardean Sale   Chief Complaint: low back pain    HPI:    02/15/2023 Patient is a 37 year old female complaining of low back pain. Patient states - pt has hx of back pain that started a few months ago. Was seen at [facility] and then by PCP. Marland Kitchen No known injury. No change in routine. Does have baby to lift. Pain radiates into butt or leg. Nothing provides relief- no improvement w/ patches or ibuprofen or heating pad.    Sciatica improved w/ Prednisone taper.  Unfortunately now that she is done w/ the medication, she feels her sxs are returning.  Will start daily Meloxicam has picked up hasn't started yet .   03/15/2023 Patient states   Relevant Historical Information: GERD  Additional pertinent review of systems negative.   Current Outpatient Medications:    albuterol (VENTOLIN HFA) 108 (90 Base) MCG/ACT inhaler, Use 2 puffs twice daily for 4-6 days, then PRN q6h cough., Disp: 1 each, Rfl: 1   EPINEPHrine 0.3 mg/0.3 mL IJ SOAJ injection, EpiPen 2-Pak 0.3 mg/0.3 mL injection, auto-injector, Disp: , Rfl:    meloxicam (MOBIC) 15 MG tablet, Take 1 tablet (15 mg total) by mouth daily., Disp: 30 tablet, Rfl: 1   nitrofurantoin, macrocrystal-monohydrate, (MACROBID) 100 MG capsule, Take 1 capsule (100 mg total) by mouth 2 (two) times daily., Disp: 14 capsule, Rfl: 0   predniSONE (DELTASONE) 10 MG tablet, 3 tabs x3 days and then 2 tabs x3 days and then 1 tab x3 days.  Take w/ food., Disp: 18 tablet, Rfl: 0   Objective:     There were no vitals filed for this visit.    There is no height or weight on file to calculate BMI.    Physical Exam:     ***   Electronically signed by:  Alyssa Le D.[name] 8:28 AM 03/14/23

## 2023-03-14 NOTE — Therapy (Signed)
OUTPATIENT PHYSICAL THERAPY LOWER EXTREMITY TREATMENT   Patient Name: Alyssa Le MRN: [id number] DOB:1986/05/03, 37 y.o., female Today's Date: 03/14/2023  END OF SESSION:  PT End of Session - 03/14/23 0911     Visit Number 4    Number of Visits 16    Date for PT Re-Evaluation 04/18/23    Authorization Type BCBS    PT Start Time [phone]    PT Stop Time 0930    PT Time Calculation (min) 36 min    Activity Tolerance Patient tolerated treatment well    Behavior During Therapy [facility] for tasks assessed/performed               Past Medical History:  Diagnosis Date   Acquired arm deformity 06/14/2013   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 Had surgery to lengthen bone when she was 37 y.o. R humerus is shorter than L, Has markedly decreased ROM in shoulder, had steroid injections in past w/minimal relief. Chronic R shoulder pain.   Allergy    multiple food & environmental allergies (tree pollens & fruits)   Anemia    Arthritis    exercise & allergy induced   Asthma    inhaler last used 2015   Closed displaced fracture of proximal phalanx of lesser toe of left foot 09/13/2016   Complication of anesthesia    post operative pulmonary edema   Diverticular disease of right side of colon 09/24/2014   Headache    History of postpartum hemorrhage    with last 2 deliveries   Hx of gastric ulcer    Kidney stone    Ovarian cyst, right    surgically removed. 37 y.o.   Stress incontinence of urine 05/18/2017   Past Surgical History:  Procedure Laterality Date   APPENDECTOMY     DILATATION & CURETTAGE/HYSTEROSCOPY WITH MYOSURE N/A 04/16/2021   Procedure: DILATATION & CURETTAGE/HYSTEROSCOPY WITH MYOSURE;  Surgeon: Edwinna Areola, DO;  Location: [facility] OR;  Service: Gynecology;  Laterality: N/A;   HUMERUS SURGERY Right    bone lengthening surgery 37 y.o due to osteomyelitis at growth plate as infant   OVARIAN CYST REMOVAL     stump  appendectomy, Pulmonary edema post op   Patient Active Problem List   Diagnosis Date Noted   History of pulmonary embolism 05/16/2022   Eczema 10/06/2020   Seasonal allergic rhinitis 09/02/2020   Asthma 09/02/2020   GERD without esophagitis 01/26/2018     PCP: Felix Pacini  REFERRING PROVIDER: Richardean Sale  REFERRING DIAG: Low back pain  THERAPY DIAG:  Other low back pain  Rationale for Evaluation and Treatment: Rehabilitation  ONSET DATE: June 2024   SUBJECTIVE:   SUBJECTIVE STATEMENT:  03/14/2023  Pt states much improved pain since last visit 2 weeks ago. She has had little pain, and has been able to resume riding exercise bike as well as being more consistent with pilates and HEP.   Eval: Pt states onset of Low back pain in June. No injury to report, but had significant pain and some pain down R leg. She had steroids at that time that helped. She had another episode at end of August, and had another round of steroids. Pain has calmed down, but she is very afraid that it will return. She has not done much movement or activity at all, due to fear of pain. She Likes to do yard work and wood work, but has not done.  Exercise: not  at this time, does have peloton.  States pain in center of low back, very deep. She has an 15 mo old, which is her 5th child. No c-sections.Did have some back pain with pregnancy, and states it did not resolve.   PERTINENT HISTORY: 5 pregnancies.   PAIN:  Are you having pain? Yes: NPRS scale: 0-3/10 Pain location: center, low back Pain description: sore, severe pain when flared up.  Aggravating factors: none stated Relieving factors: none stated    PRECAUTIONS: None   WEIGHT BEARING RESTRICTIONS: No   FALLS:  Has patient fallen in last 6 months? No   PLOF: Independent  PATIENT GOALS:  Decreased pain   NEXT MD VISIT:   OBJECTIVE:   DIAGNOSTIC FINDINGS:   PATIENT SURVEYS:  Foto: initial:  visit 2:   30 Foto: visit 4:    71.2   COGNITION: Overall cognitive status: Within functional limits for tasks assessed     SENSATION: WFL  EDEMA:    POSTURE:    No Significant postural limitations  PALPATION:  mild soreness in central low lumbar L3,4/5   LOWER EXTREMITY ROM:  03/02/23: R shoulder screen: severe joint limitation to about 90 deg of flexion. Tightness in surrounding musculature  in scapular and lat on R.   Lumbar: Flexion: mild limitation, fearful,  Extension:  mild/mod limitation/fearful,  SB:  WFL Hips: WFL,   LOWER EXTREMITY MMT:  MMT Left eval Right  eval  Hip flexion 4 4  Hip extension    Hip abduction 4 4  Hip adduction    Hip internal rotation    Hip external rotation    Knee flexion 5 5  Knee extension 5 5  Ankle dorsiflexion    Ankle plantarflexion    Ankle inversion    Ankle eversion     (Blank rows = not tested)  LOWER EXTREMITY SPECIAL TESTS:  Neg SLR    GAIT: Unremarkable    TODAY'S TREATMENT:                                                                                                                              DATE:    03/14/2023 Therapeutic Exercise: Aerobic: Supine:   Bridging 2 x 10 (mild pinch on L if going up too far ) ;    Review of TA with March x 10;   High plank- on knees 15 sec x 3 cuing for mechanics and position of R arm.   Seated: Standing:  Stretches:   Seated Piriformis 30 sec x 3 bil;  Cat cow x 15 ( R hand on towel) ; Marjo Bicker pose (modified arm position) for R lat and side body stretch x 3;  seated lat stretch for HEP x 3;  Neuromuscular Re-education: Manual Therapy:      Therapeutic Exercise: Aerobic: Supine:     TA x 10;   TA with march x 15;   SLR x 10 ea bil with TA;  Seated:  Standing:  paloff press 1 BTB x 15 bil;  Stretches:  Piriformis 30 sec x 3 bil;   Cat cow x 10 ( R hand on towel)   Neuromuscular Re-education: Manual Therapy: long leg distraction on L for lumbar pump ,   Therapeutic Activity: Self Care: Other:  ambulation for assessment of gait: Slight increase in R SB (likely baseline)    Therapeutic Exercise: Aerobic: Supine:   bridge x 2 - pain in back on L,   TA x 10;   TA with march x 15;  Seated: Standing: Stretches:   SKTC 20 sec x 3 bil;  LTR /slow x 10;  pelvic tilts 2 x 10  Neuromuscular Re-education: Manual Therapy: long leg distraction on L for lumbar pump ,  STM to L lumbar paraspinals (prone on 1 pillow )  Therapeutic Activity: Self Care: Modalities: Moist heat pack to lumbar region, in sitting, x 10 min, education on home use.    PATIENT EDUCATION:  Education details: updated and reviewed HEP Person educated: Patient Education method: Explanation, Demonstration, Tactile cues, Verbal cues, and Handouts Education comprehension: verbalized understanding, returned demonstration, verbal cues required, tactile cues required, and needs further education   HOME EXERCISE PROGRAM: Access Code: [email or web address]   ASSESSMENT:  CLINICAL IMPRESSION: 03/14/2023  Pt with much improved movement quality and ROM today. She has improved ability for functional activity at home as well as being able to do some exercise. Discussed continuing HEP and piliates for core and hip strength as tolerated. She will benefit from continued care at 1x/wk for progressing strength. She is doing much better with pain levels.   Eval: Patient presents with primary complaint of  pain in low back, with 2 episodes of severe pain. She has decreased lumbar ROM, and moderate fear of movement for these motions. She has decreased strength and stability in hips and core,and had baby less than 1 yr ago. She has poor movement quality and difficulty with child care due to pain. Pt with decreased ability for full functional activities, ADLs, IADLs. Pt will  benefit from skilled PT to improve deficits and pain and to return to PLOF.   OBJECTIVE IMPAIRMENTS: decreased activity tolerance, decreased mobility, decreased ROM, decreased  strength, increased muscle spasms, impaired flexibility, improper body mechanics, and pain.   ACTIVITY LIMITATIONS: carrying, lifting, bending, standing, squatting, stairs, hygiene/grooming, locomotion level, and caring for others  PARTICIPATION LIMITATIONS: meal prep, cleaning, laundry, driving, shopping, community activity, and yard work  PERSONAL FACTORS:  none  are also affecting patient's functional outcome.   REHAB POTENTIAL: Good  CLINICAL DECISION MAKING: Stable/uncomplicated  EVALUATION COMPLEXITY: Low   GOALS: Goals reviewed with patient? Yes  SHORT TERM GOALS: Target date: 03/07/2023   Pt to be independent with initial HEP  Goal status: MET  2.  Pt to demo improved ease and fear with movement for lumbar ROM, flex and extension.   Goal status: MET    LONG TERM GOALS: Target date: 04/18/2023   Pt to be independent with final HEP  Goal status: INITIAL  2.  Pt to demo full lumbar ROM to be [facility] and pain free.   Goal status: INITIAL  3.  Pt to demo strength and stability of hips and core to be [facility] for pt age.   Goal status: INITIAL  4.  Pt to demo ability and optimal mechanics for bend, lift, squat motion, to improve ability for child care and IADLs.   Goal status: INITIAL    PLAN:  PT  FREQUENCY: 1-2x/week  PT DURATION: 8 weeks  PLANNED INTERVENTIONS: Therapeutic exercises, Therapeutic activity, Neuromuscular re-education, Patient/Family education, Self Care, Joint mobilization, Joint manipulation, Stair training, Orthotic/Fit training, DME instructions, Aquatic Therapy, Dry Needling, Electrical stimulation, Cryotherapy, Moist heat, Taping, Ultrasound, Ionotophoresis 4mg /ml Dexamethasone, Manual therapy,  Vasopneumatic device, Traction, Spinal manipulation, Spinal mobilization,Balance training, Gait training,   PLAN FOR NEXT SESSION:  hip hinge, squat motion, core strength, bridge, review plank, dead bug,    Sedalia Muta, PT, DPT 12:21 PM   03/14/23

## 2023-03-15 ENCOUNTER — Ambulatory Visit: Payer: BC Managed Care – PPO | Admitting: Sports Medicine

## 2023-03-15 VITALS — BP 120/80 | HR 106 | Ht 66.0 in | Wt 156.0 lb

## 2023-03-15 DIAGNOSIS — M5441 Lumbago with sciatica, right side: Secondary | ICD-10-CM | POA: Diagnosis not present

## 2023-03-15 DIAGNOSIS — M5137 Other intervertebral disc degeneration, lumbosacral region with discogenic back pain only: Secondary | ICD-10-CM | POA: Diagnosis not present

## 2023-03-15 DIAGNOSIS — M5442 Lumbago with sciatica, left side: Secondary | ICD-10-CM

## 2023-03-15 DIAGNOSIS — G8929 Other chronic pain: Secondary | ICD-10-CM | POA: Diagnosis not present

## 2023-03-16 ENCOUNTER — Ambulatory Visit (INDEPENDENT_AMBULATORY_CARE_PROVIDER_SITE_OTHER): Payer: BC Managed Care – PPO | Admitting: Physical Therapy

## 2023-03-16 ENCOUNTER — Encounter: Payer: Self-pay | Admitting: Physical Therapy

## 2023-03-16 DIAGNOSIS — M5459 Other low back pain: Secondary | ICD-10-CM | POA: Diagnosis not present

## 2023-03-16 NOTE — Therapy (Signed)
OUTPATIENT PHYSICAL THERAPY LOWER EXTREMITY TREATMENT   Patient Name: Alyssa Le MRN: [id number] DOB:23-Nov-1985, 37 y.o., female Today's Date: 03/16/2023  END OF SESSION:  PT End of Session - 03/16/23 0923     Visit Number 5    Number of Visits 16    Date for PT Re-Evaluation 04/18/23    Authorization Type BCBS    PT Start Time 0848    PT Stop Time 0926    PT Time Calculation (min) 38 min    Activity Tolerance Patient tolerated treatment well    Behavior During Therapy [facility] for tasks assessed/performed                Past Medical History:  Diagnosis Date   Acquired arm deformity 06/14/2013   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 Had surgery to lengthen bone when she was 37 y.o. R humerus is shorter than L, Has markedly decreased ROM in shoulder, had steroid injections in past w/minimal relief. Chronic R shoulder pain.   Allergy    multiple food & environmental allergies (tree pollens & fruits)   Anemia    Arthritis    exercise & allergy induced   Asthma    inhaler last used 2015   Closed displaced fracture of proximal phalanx of lesser toe of left foot 09/13/2016   Complication of anesthesia    post operative pulmonary edema   Diverticular disease of right side of colon 09/24/2014   Headache    History of postpartum hemorrhage    with last 2 deliveries   Hx of gastric ulcer    Kidney stone    Ovarian cyst, right    surgically removed. 37 y.o.   Stress incontinence of urine 05/18/2017   Past Surgical History:  Procedure Laterality Date   APPENDECTOMY     DILATATION & CURETTAGE/HYSTEROSCOPY WITH MYOSURE N/A 04/16/2021   Procedure: DILATATION & CURETTAGE/HYSTEROSCOPY WITH MYOSURE;  Surgeon: Edwinna Areola, DO;  Location: [facility] OR;  Service: Gynecology;  Laterality: N/A;   HUMERUS SURGERY Right    bone lengthening surgery 37 y.o due to osteomyelitis at growth plate as infant   OVARIAN CYST REMOVAL     stump  appendectomy, Pulmonary edema post op   Patient Active Problem List   Diagnosis Date Noted   History of pulmonary embolism 05/16/2022   Eczema 10/06/2020   Seasonal allergic rhinitis 09/02/2020   Asthma 09/02/2020   GERD without esophagitis 01/26/2018     PCP: Felix Pacini  REFERRING PROVIDER: Richardean Sale  REFERRING DIAG: Low back pain  THERAPY DIAG:  Other low back pain  Rationale for Evaluation and Treatment: Rehabilitation  ONSET DATE: June 2024   SUBJECTIVE:   SUBJECTIVE STATEMENT:  03/16/2023  Pt continues to have much improved pain overall.   Eval: Pt states onset of Low back pain in June. No injury to report, but had significant pain and some pain down R leg. She had steroids at that time that helped. She had another episode at end of August, and had another round of steroids. Pain has calmed down, but she is very afraid that it will return. She has not done much movement or activity at all, due to fear of pain. She Likes to do yard work and wood work, but has not done.  Exercise: not at this time, does have peloton.  States pain in center of low back, very deep. She has an 29 mo old, which is her 5th  child. No c-sections.Did have some back pain with pregnancy, and states it did not resolve.   PERTINENT HISTORY: 5 pregnancies.   PAIN:  Are you having pain? Yes: NPRS scale: 0-3/10 Pain location: center, low back Pain description: sore, severe pain when flared up.  Aggravating factors: none stated Relieving factors: none stated    PRECAUTIONS: None   WEIGHT BEARING RESTRICTIONS: No   FALLS:  Has patient fallen in last 6 months? No   PLOF: Independent  PATIENT GOALS:  Decreased pain   NEXT MD VISIT:   OBJECTIVE:   DIAGNOSTIC FINDINGS:   PATIENT SURVEYS:  Foto: initial:  visit 2:   41 Foto: visit 4:   71.2   COGNITION: Overall cognitive status: Within functional limits for tasks assessed     SENSATION: WFL  EDEMA:    POSTURE:     No Significant postural limitations  PALPATION:  mild soreness in central low lumbar L3,4/5   LOWER EXTREMITY ROM:  03/02/23: R shoulder screen: severe joint limitation to about 90 deg of flexion. Tightness in surrounding musculature  in scapular and lat on R.   Lumbar: Flexion: mild limitation, fearful,  Extension:  mild/mod limitation/fearful,  SB:  WFL Hips: WFL,   LOWER EXTREMITY MMT:  MMT Left eval Right  eval  Hip flexion 4 4  Hip extension    Hip abduction 4 4  Hip adduction    Hip internal rotation    Hip external rotation    Knee flexion 5 5  Knee extension 5 5  Ankle dorsiflexion    Ankle plantarflexion    Ankle inversion    Ankle eversion     (Blank rows = not tested)  LOWER EXTREMITY SPECIAL TESTS:  Neg SLR   GAIT: Unremarkable    TODAY'S TREATMENT:                                                                                                                              DATE:    03/16/2023 Therapeutic Exercise: Aerobic: Supine:   Dead bug x 15;  Bridging 2 x 10 (no pain)  Prone: hip ext x 10,  locus (upper body only) 3 sec x 10;  Seated: sit to stand x 10 with cuing for mechanics,  hip hinge x 10 Standing:  mini squats x 10, then 10 with 10lb weight;  paloff press Blue TB x 15 bil;  Stretches:  Seated Piriformis 30 sec x 2 bil;  Cat cow x 15 ( R hand on towel); Marjo Bicker pose (modified arm position) for R lat and side body stretch, L, R , Center,  x 3;   Neuromuscular Re-education: Manual Therapy:     Therapeutic Exercise: Aerobic: Supine:   Bridging 2 x 10 (mild pinch on L if going up too far ) ;  Review of TA with March x 10;   High plank- on knees 15 sec x 3 cuing for mechanics and position of R arm.  Seated: Standing:  Stretches:   Seated Piriformis 30 sec x 2 bil;  Cat cow x 15 ( R hand on towel) ; Marjo Bicker pose (modified arm position) for R lat and side body stretch x 3;  seated lat stretch for HEP x 3;  Neuromuscular Re-education: Manual  Therapy:      Therapeutic Exercise: Aerobic: Supine:     TA x 10;   TA with march x 15;   SLR x 10 ea bil with TA;  Seated: Standing:  paloff press 1 BTB x 15 bil;  Stretches:  Piriformis 30 sec x 3 bil;   Cat cow x 10 ( R hand on towel)   Neuromuscular Re-education: Manual Therapy: long leg distraction on L for lumbar pump ,   Therapeutic Activity: Self Care: Other: ambulation for assessment of gait: Slight increase in R SB (likely baseline)    Therapeutic Exercise: Aerobic: Supine:   bridge x 2 - pain in back on L,   TA x 10;   TA with march x 15;  Seated: Standing: Stretches:   SKTC 20 sec x 3 bil;  LTR /slow x 10;  pelvic tilts 2 x 10  Neuromuscular Re-education: Manual Therapy: long leg distraction on L for lumbar pump ,  STM to L lumbar paraspinals (prone on 1 pillow )  Therapeutic Activity: Self Care: Modalities: Moist heat pack to lumbar region, in sitting, x 10 min, education on home use.    PATIENT EDUCATION:  Education details: updated and reviewed HEP Person educated: Patient Education method: Explanation, Demonstration, Tactile cues, Verbal cues, and Handouts Education comprehension: verbalized understanding, returned demonstration, verbal cues required, tactile cues required, and needs further education   HOME EXERCISE PROGRAM: Access Code: [email or web address]   ASSESSMENT:  CLINICAL IMPRESSION: 03/16/2023  Pt progressing well. Able to progress light strengthening today, no pain with bridging or ther ex. Still with slight pinch with end range lumbar extension, but much improved ROM and movement quality. Plan to progress strength as tolerated, and reduce to 1x/wk.    Eval: Patient presents with primary complaint of  pain in low back, with 2 episodes of severe pain. She has decreased lumbar ROM, and moderate fear of movement for these motions. She has decreased strength and stability in hips and core,and had baby less than 1 yr ago. She has poor movement quality and  difficulty with child care due to pain. Pt with decreased ability for full functional activities, ADLs, IADLs. Pt will  benefit from skilled PT to improve deficits and pain and to return to PLOF.   OBJECTIVE IMPAIRMENTS: decreased activity tolerance, decreased mobility, decreased ROM, decreased strength, increased muscle spasms, impaired flexibility, improper body mechanics, and pain.   ACTIVITY LIMITATIONS: carrying, lifting, bending, standing, squatting, stairs, hygiene/grooming, locomotion level, and caring for others  PARTICIPATION LIMITATIONS: meal prep, cleaning, laundry, driving, shopping, community activity, and yard work  PERSONAL FACTORS:  none  are also affecting patient's functional outcome.   REHAB POTENTIAL: Good  CLINICAL DECISION MAKING: Stable/uncomplicated  EVALUATION COMPLEXITY: Low   GOALS: Goals reviewed with patient? Yes  SHORT TERM GOALS: Target date: 03/07/2023   Pt to be independent with initial HEP  Goal status: MET  2.  Pt to demo improved ease and fear with movement for lumbar ROM, flex and extension.   Goal status: MET    LONG TERM GOALS: Target date: 04/18/2023   Pt to be independent with final HEP  Goal status: INITIAL  2.  Pt to demo full lumbar  ROM to be [facility] and pain free.   Goal status: INITIAL  3.  Pt to demo strength and stability of hips and core to be [facility] for pt age.   Goal status: INITIAL  4.  Pt to demo ability and optimal mechanics for bend, lift, squat motion, to improve ability for child care and IADLs.   Goal status: INITIAL    PLAN:  PT FREQUENCY: 1-2x/week  PT DURATION: 8 weeks  PLANNED INTERVENTIONS: Therapeutic exercises, Therapeutic activity, Neuromuscular re-education, Patient/Family education, Self Care, Joint mobilization, Joint manipulation, Stair training, Orthotic/Fit training, DME instructions, Aquatic Therapy, Dry Needling, Electrical stimulation, Cryotherapy, Moist heat, Taping, Ultrasound,  Ionotophoresis 4mg /ml Dexamethasone, Manual therapy,  Vasopneumatic device, Traction, Spinal manipulation, Spinal mobilization,Balance training, Gait training,   PLAN FOR NEXT SESSION:  hip hinge, squat motion, core strength, bridge, review plank, dead bug,    Sedalia Muta, PT, DPT 9:26 AM  03/16/23

## 2023-03-21 ENCOUNTER — Encounter: Payer: BC Managed Care – PPO | Admitting: Physical Therapy

## 2023-03-28 ENCOUNTER — Encounter: Payer: Self-pay | Admitting: Physical Therapy

## 2023-03-28 ENCOUNTER — Ambulatory Visit (INDEPENDENT_AMBULATORY_CARE_PROVIDER_SITE_OTHER): Payer: BC Managed Care – PPO | Admitting: Physical Therapy

## 2023-03-28 DIAGNOSIS — M5459 Other low back pain: Secondary | ICD-10-CM | POA: Diagnosis not present

## 2023-03-28 DIAGNOSIS — Z124 Encounter for screening for malignant neoplasm of cervix: Secondary | ICD-10-CM | POA: Diagnosis not present

## 2023-03-28 DIAGNOSIS — Z130 Encounter for screening for diseases of the blood and blood-forming organs and certain disorders involving the immune mechanism: Secondary | ICD-10-CM | POA: Diagnosis not present

## 2023-03-28 DIAGNOSIS — Z01419 Encounter for gynecological examination (general) (routine) without abnormal findings: Secondary | ICD-10-CM | POA: Diagnosis not present

## 2023-03-28 LAB — HM PAP SMEAR: HM Pap smear: NORMAL

## 2023-03-28 NOTE — Therapy (Signed)
OUTPATIENT PHYSICAL THERAPY LOWER EXTREMITY TREATMENT   Patient Name: Alyssa Le MRN: [id number] DOB:22-Mar-1986, 37 y.o., female Today's Date: 03/28/2023  END OF SESSION:  PT End of Session - 03/28/23 0848     Visit Number 6    Number of Visits 16    Date for PT Re-Evaluation 04/18/23    Authorization Type BCBS    PT Start Time 0849    PT Stop Time 0928    PT Time Calculation (min) 39 min    Activity Tolerance Patient tolerated treatment well    Behavior During Therapy [facility] for tasks assessed/performed                 Past Medical History:  Diagnosis Date   Acquired arm deformity 06/14/2013   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 Had surgery to lengthen bone when she was 37 y.o. R humerus is shorter than L, Has markedly decreased ROM in shoulder, had steroid injections in past w/minimal relief. Chronic R shoulder pain.   Allergy    multiple food & environmental allergies (tree pollens & fruits)   Anemia    Arthritis    exercise & allergy induced   Asthma    inhaler last used 2015   Closed displaced fracture of proximal phalanx of lesser toe of left foot 09/13/2016   Complication of anesthesia    post operative pulmonary edema   Diverticular disease of right side of colon 09/24/2014   Headache    History of postpartum hemorrhage    with last 2 deliveries   Hx of gastric ulcer    Kidney stone    Ovarian cyst, right    surgically removed. 37 y.o.   Stress incontinence of urine 05/18/2017   Past Surgical History:  Procedure Laterality Date   APPENDECTOMY     DILATATION & CURETTAGE/HYSTEROSCOPY WITH MYOSURE N/A 04/16/2021   Procedure: DILATATION & CURETTAGE/HYSTEROSCOPY WITH MYOSURE;  Surgeon: Edwinna Areola, DO;  Location: [facility] OR;  Service: Gynecology;  Laterality: N/A;   HUMERUS SURGERY Right    bone lengthening surgery 37 y.o due to osteomyelitis at growth plate as infant   OVARIAN CYST REMOVAL     stump  appendectomy, Pulmonary edema post op   Patient Active Problem List   Diagnosis Date Noted   History of pulmonary embolism 05/16/2022   Eczema 10/06/2020   Seasonal allergic rhinitis 09/02/2020   Asthma 09/02/2020   GERD without esophagitis 01/26/2018     PCP: Felix Pacini  REFERRING PROVIDER: Richardean Sale  REFERRING DIAG: Low back pain  THERAPY DIAG:  Other low back pain  Rationale for Evaluation and Treatment: Rehabilitation  ONSET DATE: June 2024   SUBJECTIVE:   SUBJECTIVE STATEMENT:  03/28/2023  Pt states doing well. She has had some soreness in the mornings for about a week. She has not been able to exercise as much. After about 8:30-9 am back is feeling good.   Eval: Pt states onset of Low back pain in June. No injury to report, but had significant pain and some pain down R leg. She had steroids at that time that helped. She had another episode at end of August, and had another round of steroids. Pain has calmed down, but she is very afraid that it will return. She has not done much movement or activity at all, due to fear of pain. She Likes to do yard work and wood work, but has not done.  Exercise: not  at this time, does have peloton.  States pain in center of low back, very deep. She has an 2 mo old, which is her 5th child. No c-sections.Did have some back pain with pregnancy, and states it did not resolve.   PERTINENT HISTORY: 5 pregnancies.   PAIN:  Are you having pain? Yes: NPRS scale: 0-3/10 Pain location: center, low back Pain description: sore, severe pain when flared up.  Aggravating factors: none stated Relieving factors: none stated    PRECAUTIONS: None   WEIGHT BEARING RESTRICTIONS: No   FALLS:  Has patient fallen in last 6 months? No   PLOF: Independent  PATIENT GOALS:  Decreased pain   NEXT MD VISIT:   OBJECTIVE:   DIAGNOSTIC FINDINGS:   PATIENT SURVEYS:  Foto: initial:  visit 2:   23 Foto: visit 4:    71.2    COGNITION: Overall cognitive status: Within functional limits for tasks assessed     SENSATION: WFL  EDEMA:    POSTURE:    No Significant postural limitations  PALPATION:  mild soreness in central low lumbar L3,4/5   LOWER EXTREMITY ROM:  03/02/23: R shoulder screen: severe joint limitation to about 90 deg of flexion. Tightness in surrounding musculature  in scapular and lat on R.   Lumbar: Flexion: mild limitation, fearful,  Extension:  mild/mod limitation/fearful,  SB:  WFL Hips: WFL,   LOWER EXTREMITY MMT:  MMT Left eval Right  eval  Hip flexion 4 4  Hip extension    Hip abduction 4 4  Hip adduction    Hip internal rotation    Hip external rotation    Knee flexion 5 5  Knee extension 5 5  Ankle dorsiflexion    Ankle plantarflexion    Ankle inversion    Ankle eversion     (Blank rows = not tested)  LOWER EXTREMITY SPECIAL TESTS:  Neg SLR   GAIT: Unremarkable    TODAY'S TREATMENT:                                                                                                                              DATE:   03/28/2023 Therapeutic Exercise: Aerobic: Supine:   Dead bug 2 x 10; Bridging x 10, SL bridge 2 x 5 bil;  Seated:  Standing: squats x 10 with cueing for form, x 15, 20lb KB;   paloff press Blue TB x 15 bil;  Stretches: Cat cow x 15 ( R hand on towel); Marjo Bicker pose (modified arm position) for R lat and side body stretch, L, R , Center,  x 3;   Neuromuscular Re-education: Manual Therapy:     Therapeutic Exercise: Aerobic: Supine:   Dead bug x 15;   Bridging 2 x 10 (no pain)  Seated: sit to stand x 10 with cuing for mechanics,  hip hinge x 10 Standing:  mini squats x 10, then 10 with 10lb weight;  paloff press Blue TB x 15 bil;  Stretches:  Seated Piriformis 30 sec x 2 bil;  Cat cow x 15 ( R hand on towel);  Marjo Bicker pose (modified arm position) for R lat and side body stretch, L, R , Center, x 3;   Neuromuscular Re-education: Manual  Therapy:     Therapeutic Exercise: Aerobic: Supine:   Bridging 2 x 10 (mild pinch on L if going up too far ) ;  Review of TA with March x 10;   High plank- on knees 15 sec x 3 cuing for mechanics and position of R arm.   Seated: Standing:  Stretches:   Seated Piriformis 30 sec x 2 bil;  Cat cow x 15 ( R hand on towel) ; Marjo Bicker pose (modified arm position) for R lat and side body stretch x 3;  seated lat stretch for HEP x 3;  Neuromuscular Re-education: Manual Therapy:     Therapeutic Exercise: Aerobic: Supine:     TA x 10;   TA with march x 15;   SLR x 10 ea bil with TA;  Seated: Standing:  paloff press 1 BTB x 15 bil;  Stretches:  Piriformis 30 sec x 3 bil;   Cat cow x 10 ( R hand on towel)   Neuromuscular Re-education: Manual Therapy: long leg distraction on L for lumbar pump ,   Therapeutic Activity: Self Care: Other: ambulation for assessment of gait: Slight increase in R SB (likely baseline)    PATIENT EDUCATION:  Education details: updated and reviewed HEP Person educated: Patient Education method: Explanation, Demonstration, Tactile cues, Verbal cues, and Handouts Education comprehension: verbalized understanding, returned demonstration, verbal cues required, tactile cues required, and needs further education   HOME EXERCISE PROGRAM: Access Code: [email or web address]   ASSESSMENT:  CLINICAL IMPRESSION:  03/28/2023  Pt with slightly more pain this week in [place]. Discussed trying 1-2 stretches first thing to help with this. She is still going much better overall with less pain. Able to perform strengthening today without pain. Improved mechanics for squat after education and practice today. Pt to benefit from continued care, and progressive strength as able.   Eval: Patient presents with primary complaint of  pain in low back, with 2 episodes of severe pain. She has decreased lumbar ROM, and moderate fear of movement for these motions. She has decreased strength and stability in  hips and core,and had baby less than 1 yr ago. She has poor movement quality and difficulty with child care due to pain. Pt with decreased ability for full functional activities, ADLs, IADLs. Pt will  benefit from skilled PT to improve deficits and pain and to return to PLOF.   OBJECTIVE IMPAIRMENTS: decreased activity tolerance, decreased mobility, decreased ROM, decreased strength, increased muscle spasms, impaired flexibility, improper body mechanics, and pain.   ACTIVITY LIMITATIONS: carrying, lifting, bending, standing, squatting, stairs, hygiene/grooming, locomotion level, and caring for others  PARTICIPATION LIMITATIONS: meal prep, cleaning, laundry, driving, shopping, community activity, and yard work  PERSONAL FACTORS:  none  are also affecting patient's functional outcome.   REHAB POTENTIAL: Good  CLINICAL DECISION MAKING: Stable/uncomplicated  EVALUATION COMPLEXITY: Low   GOALS: Goals reviewed with patient? Yes  SHORT TERM GOALS: Target date: 03/07/2023   Pt to be independent with initial HEP  Goal status: MET  2.  Pt to demo improved ease and fear with movement for lumbar ROM, flex and extension.   Goal status: MET    LONG TERM GOALS: Target date: 04/18/2023   Pt to be independent with final HEP  Goal status: INITIAL  2.  Pt to demo full lumbar ROM to be [facility] and pain free.   Goal status: INITIAL  3.  Pt to demo strength and stability of hips and core to be [facility] for pt age.   Goal status: INITIAL  4.  Pt to demo ability and optimal mechanics for bend, lift, squat motion, to improve ability for child care and IADLs.   Goal status: INITIAL    PLAN:  PT FREQUENCY: 1-2x/week  PT DURATION: 8 weeks  PLANNED INTERVENTIONS: Therapeutic exercises, Therapeutic activity, Neuromuscular re-education, Patient/Family education, Self Care, Joint mobilization, Joint manipulation, Stair training, Orthotic/Fit training, DME instructions, Aquatic Therapy, Dry  Needling, Electrical stimulation, Cryotherapy, Moist heat, Taping, Ultrasound, Ionotophoresis 4mg /ml Dexamethasone, Manual therapy,  Vasopneumatic device, Traction, Spinal manipulation, Spinal mobilization,Balance training, Gait training,   PLAN FOR NEXT SESSION:  hip hinge, squat motion, core strength, bridge, review plank, dead bug,    Sedalia Muta, PT, DPT 3:52 PM  03/28/23

## 2023-04-04 ENCOUNTER — Encounter: Payer: Self-pay | Admitting: Physical Therapy

## 2023-04-04 ENCOUNTER — Ambulatory Visit (INDEPENDENT_AMBULATORY_CARE_PROVIDER_SITE_OTHER): Payer: BC Managed Care – PPO | Admitting: Physical Therapy

## 2023-04-04 DIAGNOSIS — M5459 Other low back pain: Secondary | ICD-10-CM | POA: Diagnosis not present

## 2023-04-04 NOTE — Therapy (Signed)
OUTPATIENT PHYSICAL THERAPY LOWER EXTREMITY TREATMENT   Patient Name: Alyssa Le MRN: [id number] DOB:1986-02-06, 37 y.o., female Today's Date: 04/04/2023  END OF SESSION:  PT End of Session - 04/04/23 0941     Visit Number 7    Number of Visits 16    Date for PT Re-Evaluation 04/18/23    Authorization Type [employer]    PT Start Time 0848    PT Stop Time 0927    PT Time Calculation (min) 39 min    Activity Tolerance Patient tolerated treatment well    Behavior During Therapy [facility] for tasks assessed/performed                  Past Medical History:  Diagnosis Date   Acquired arm deformity 06/14/2013   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 Had surgery to lengthen bone when she was 37 y.o. R humerus is shorter than L, Has markedly decreased ROM in shoulder, had steroid injections in past w/minimal relief. Chronic R shoulder pain.   Allergy    multiple food & environmental allergies (tree pollens & fruits)   Anemia    Arthritis    exercise & allergy induced   Asthma    inhaler last used 2015   Closed displaced fracture of proximal phalanx of lesser toe of left foot 09/13/2016   Complication of anesthesia    post operative pulmonary edema   Diverticular disease of right side of colon 09/24/2014   Headache    History of postpartum hemorrhage    with last 2 deliveries   Hx of gastric ulcer    Kidney stone    Ovarian cyst, right    surgically removed. 37 y.o.   Stress incontinence of urine 05/18/2017   Past Surgical History:  Procedure Laterality Date   APPENDECTOMY     DILATATION & CURETTAGE/HYSTEROSCOPY WITH MYOSURE N/A 04/16/2021   Procedure: DILATATION & CURETTAGE/HYSTEROSCOPY WITH MYOSURE;  Surgeon: Edwinna Areola, DO;  Location: [facility] OR;  Service: Gynecology;  Laterality: N/A;   HUMERUS SURGERY Right    bone lengthening surgery 37 y.o due to osteomyelitis at growth plate as infant   OVARIAN CYST REMOVAL      stump appendectomy, Pulmonary edema post op   Patient Active Problem List   Diagnosis Date Noted   History of pulmonary embolism 05/16/2022   Eczema 10/06/2020   Seasonal allergic rhinitis 09/02/2020   Asthma 09/02/2020   GERD without esophagitis 01/26/2018     PCP: Felix Pacini  REFERRING PROVIDER: Richardean Sale  REFERRING DIAG: Low back pain  THERAPY DIAG:  Other low back pain  Rationale for Evaluation and Treatment: Rehabilitation  ONSET DATE: June 2024   SUBJECTIVE:   SUBJECTIVE STATEMENT:  04/04/2023  Pt states doing well. She has had mild soreness in the mornings, but eases after activity, and no pain during day or in evening. She has been doing well with HEP.   Eval: Pt states onset of Low back pain in June. No injury to report, but had significant pain and some pain down R leg. She had steroids at that time that helped. She had another episode at end of August, and had another round of steroids. Pain has calmed down, but she is very afraid that it will return. She has not done much movement or activity at all, due to fear of pain. She Likes to do yard work and wood work, but has not done.  Exercise: not at  this time, does have peloton.  States pain in center of low back, very deep. She has an 46 mo old, which is her 5th child. No c-sections.Did have some back pain with pregnancy, and states it did not resolve.   PERTINENT HISTORY: 5 pregnancies.   PAIN:  Are you having pain? Yes: NPRS scale: 0-1/10 Pain location: center, low back Pain description: sore, severe pain when flared up.  Aggravating factors: none stated Relieving factors: none stated    PRECAUTIONS: None   WEIGHT BEARING RESTRICTIONS: No   FALLS:  Has patient fallen in last 6 months? No   PLOF: Independent  PATIENT GOALS:  Decreased pain   NEXT MD VISIT:   OBJECTIVE:  updated 04/04/23  DIAGNOSTIC FINDINGS:   PATIENT SURVEYS:  Foto: initial:  visit 2:   87 Foto: visit 4:    71.2 Vist 7: d/c: 44    COGNITION: Overall cognitive status: Within functional limits for tasks assessed     SENSATION: WFL  EDEMA:   POSTURE:    No Significant postural limitations  PALPATION:   LOWER EXTREMITY ROM:  03/02/23: R shoulder screen: severe joint limitation to about 90 deg of flexion. Tightness in surrounding musculature  in scapular and lat on R.   Lumbar: [facility]  LOWER EXTREMITY MMT:  MMT Left 04/04/23 Right  04/04/23  Hip flexion 4+ 4+  Hip extension    Hip abduction 5 5  Hip adduction    Hip internal rotation    Hip external rotation    Knee flexion 5 5  Knee extension 5 5  Ankle dorsiflexion    Ankle plantarflexion    Ankle inversion    Ankle eversion     (Blank rows = not tested)  LOWER EXTREMITY SPECIAL TESTS:  Neg SLR   GAIT: Unremarkable    TODAY'S TREATMENT:                                                                                                                              DATE:   04/04/2023 Therapeutic Exercise: Aerobic: Supine:   Dead bug 2 x 10; Bridging x 10, SL bridge 2 x 10  bil; SLR x 15 bil;  Bird dog, LEs only 2 x 10;  Seated:  Standing: squats x 10 with cueing for form, x 15, 20lb KB;   Rows Blue TB x 20;  Stretches: Cat cow x 15 ( R hand on towel); Marjo Bicker pose (modified arm position) for R lat and side body stretch, L, R , Center,  x 3;   Neuromuscular Re-education: Manual Therapy:     Therapeutic Exercise: Aerobic: Supine:   Dead bug x 15;   Bridging 2 x 10 (no pain)  Seated: sit to stand x 10 with cuing for mechanics,  hip hinge x 10 Standing:  mini squats x 10, then 10 with 10lb weight;  paloff press Blue TB x 15 bil;  Stretches:  Seated Piriformis 30 sec x  2 bil;  Cat cow x 15 ( R hand on towel);  Marjo Bicker pose (modified arm position) for R lat and side body stretch, L, R , Center, x 3;   Neuromuscular Re-education: Manual Therapy:     PATIENT EDUCATION:  Education details: updated and reviewed  HEP Person educated: Patient Education method: Explanation, Demonstration, Tactile cues, Verbal cues, and Handouts Education comprehension: verbalized understanding, returned demonstration, verbal cues required, tactile cues required, and needs further education   HOME EXERCISE PROGRAM: Access Code: [email or web address]   ASSESSMENT:  CLINICAL IMPRESSION:  04/04/2023  Pt has made very good progress. She has met goals at this time, and is ready for d/c. She has much improved ROM and movement quality, as well as good ability for squat, bend, lift and childcare/IADLS. Pt in agreement with plan. Will f/u with Sports med as needed.   Eval: Patient presents with primary complaint of  pain in low back, with 2 episodes of severe pain. She has decreased lumbar ROM, and moderate fear of movement for these motions. She has decreased strength and stability in hips and core,and had baby less than 1 yr ago. She has poor movement quality and difficulty with child care due to pain. Pt with decreased ability for full functional activities, ADLs, IADLs. Pt will  benefit from skilled PT to improve deficits and pain and to return to PLOF.   OBJECTIVE IMPAIRMENTS: decreased activity tolerance, decreased mobility, decreased ROM, decreased strength, increased muscle spasms, impaired flexibility, improper body mechanics, and pain.   ACTIVITY LIMITATIONS: carrying, lifting, bending, standing, squatting, stairs, hygiene/grooming, locomotion level, and caring for others  PARTICIPATION LIMITATIONS: meal prep, cleaning, laundry, driving, shopping, community activity, and yard work  PERSONAL FACTORS:  none  are also affecting patient's functional outcome.   REHAB POTENTIAL: Good  CLINICAL DECISION MAKING: Stable/uncomplicated  EVALUATION COMPLEXITY: Low   GOALS: Goals reviewed with patient? Yes  SHORT TERM GOALS: Target date: 03/07/2023   Pt to be independent with initial HEP  Goal status: MET  2.  Pt to demo  improved ease and fear with movement for lumbar ROM, flex and extension.   Goal status: MET    LONG TERM GOALS: Target date: 04/18/2023   Pt to be independent with final HEP  Goal status: MET  2.  Pt to demo full lumbar ROM to be [facility] and pain free.   Goal status: MET  3.  Pt to demo strength and stability of hips and core to be [facility] for pt age.   Goal status: MET  4.  Pt to demo ability and optimal mechanics for bend, lift, squat motion, to improve ability for child care and IADLs.   Goal status: MET   PLAN:  PT FREQUENCY: 1-2x/week  PT DURATION: 8 weeks  PLANNED INTERVENTIONS: Therapeutic exercises, Therapeutic activity, Neuromuscular re-education, Patient/Family education, Self Care, Joint mobilization, Joint manipulation, Stair training, Orthotic/Fit training, DME instructions, Aquatic Therapy, Dry Needling, Electrical stimulation, Cryotherapy, Moist heat, Taping, Ultrasound, Ionotophoresis 4mg /ml Dexamethasone, Manual therapy,  Vasopneumatic device, Traction, Spinal manipulation, Spinal mobilization,Balance training, Gait training,   PLAN FOR NEXT SESSION:  hip hinge, squat motion, core strength, bridge, review plank, dead bug,    Sedalia Muta, PT, DPT 9:42 AM  04/04/23   PHYSICAL THERAPY DISCHARGE SUMMARY  Visits from Start of Care: 7   Plan: Patient agrees to discharge.  Patient goals were met. Patient is being discharged due to meeting the stated rehab goals.      Sedalia Muta, PT, DPT 9:57 AM  04/04/23    

## 2023-04-28 ENCOUNTER — Ambulatory Visit (INDEPENDENT_AMBULATORY_CARE_PROVIDER_SITE_OTHER): Payer: BC Managed Care – PPO | Admitting: Sports Medicine

## 2023-04-28 VITALS — BP 122/82 | HR 86 | Ht 66.0 in | Wt 156.0 lb

## 2023-04-28 DIAGNOSIS — M5137 Other intervertebral disc degeneration, lumbosacral region with discogenic back pain only: Secondary | ICD-10-CM

## 2023-04-28 DIAGNOSIS — M545 Low back pain, unspecified: Secondary | ICD-10-CM

## 2023-04-28 DIAGNOSIS — G8929 Other chronic pain: Secondary | ICD-10-CM

## 2023-04-28 MED ORDER — METHYLPREDNISOLONE ACETATE 80 MG/ML IJ SUSP
80.0000 mg | Freq: Once | INTRAMUSCULAR | Status: AC
Start: 2023-04-28 — End: 2023-04-28
  Administered 2023-04-28: 80 mg via INTRAMUSCULAR

## 2023-04-28 MED ORDER — KETOROLAC TROMETHAMINE 60 MG/2ML IM SOLN
60.0000 mg | Freq: Once | INTRAMUSCULAR | Status: AC
Start: 2023-04-28 — End: 2023-04-28
  Administered 2023-04-28: 60 mg via INTRAMUSCULAR

## 2023-04-28 MED ORDER — CELECOXIB 200 MG PO CAPS: 200.0000 mg | ORAL_CAPSULE | Freq: Two times a day (BID) | ORAL | 0 refills | Status: DC

## 2023-04-28 NOTE — Progress Notes (Signed)
Alyssa Le D.[facility] [address] [place] [address] Phone: [phone]   Assessment and Plan:     1. Chronic bilateral low back pain without sciatica 2. Degeneration of intervertebral disc of lumbosacral region with discogenic back pain  -Chronic with exacerbation, subsequent visit - Consistent with flare of lumbosacral discogenic pain likely from increased physical activity - Start Celebrex 200 mg twice daily x2 weeks.  If still having pain after 2 weeks, complete 3rd-week of NSAID. May use remaining NSAID as needed once daily for pain control.  Do not to use additional over-the-counter NSAIDs (ibuprofen, naproxen, Advil, Aleve) while taking prescription NSAIDs.  May use Tylenol [phone] mg 2 to 3 times a day for breakthrough pain.  Patient was not able to tolerate meloxicam due to GI discomfort.  Recommend taking Celebrex with food, PPI - Patient elected for IM injection of methylprednisone 80 mg/Toradol 60 mg.  Injection given in clinic today and tolerated well. - Continue HEP as tolerated  Pertinent previous records reviewed include none  Follow Up: 3 weeks for reevaluation.  Could consider OMT versus prednisone course versus advanced imaging   Subjective:   I, Alyssa Le, am serving as a Neurosurgeon for Doctor Richardean Sale   Chief Complaint: low back pain    HPI:    02/15/2023 Patient is a 37 year old female complaining of low back pain. Patient states - pt has hx of back pain that started a few months ago. Was seen at [facility] and then by PCP. Marland Kitchen No known injury. No change in routine. Does have baby to lift. Pain radiates into butt or leg. Nothing provides relief- no improvement w/ patches or ibuprofen or heating pad.    Sciatica improved w/ Prednisone taper.  Unfortunately now that she is done w/ the medication, she feels her sxs are returning.  Will start daily Meloxicam has picked up hasn't started yet .    03/15/2023 Patient  states that she is great   04/28/2023 Patient states states she was doing really well, but now she is in flare . She did an exercise class. She isnt able to put her socks on.  had to take a meloxicam yesterday    Relevant Historical Information: GERD  Additional pertinent review of systems negative.   Current Outpatient Medications:    albuterol (VENTOLIN HFA) 108 (90 Base) MCG/ACT inhaler, Use 2 puffs twice daily for 4-6 days, then PRN q6h cough., Disp: 1 each, Rfl: 1   celecoxib (CELEBREX) 200 MG capsule, Take 1 capsule (200 mg total) by mouth 2 (two) times daily., Disp: 60 capsule, Rfl: 0   EPINEPHrine 0.3 mg/0.3 mL IJ SOAJ injection, EpiPen 2-Pak 0.3 mg/0.3 mL injection, auto-injector, Disp: , Rfl:    meloxicam (MOBIC) 15 MG tablet, Take 1 tablet (15 mg total) by mouth daily., Disp: 30 tablet, Rfl: 1   nitrofurantoin, macrocrystal-monohydrate, (MACROBID) 100 MG capsule, Take 1 capsule (100 mg total) by mouth 2 (two) times daily., Disp: 14 capsule, Rfl: 0   predniSONE (DELTASONE) 10 MG tablet, 3 tabs x3 days and then 2 tabs x3 days and then 1 tab x3 days.  Take w/ food., Disp: 18 tablet, Rfl: 0   Objective:     Vitals:   04/28/23 0826  BP: 122/82  Pulse: 86  SpO2: (!) 10%  Weight: 156 lb (70.8 kg)  Height: 5\' 6"  (1.676 m)      Body mass index is 25.18 kg/m.    Physical Exam:  Gen: Appears well, nad, nontoxic and pleasant Psych: Alert and oriented, appropriate mood and affect Neuro: sensation intact, strength is 5/5 in upper and lower extremities, muscle tone wnl Skin: no susupicious lesions or rashes   Back - Normal skin, Spine with normal alignment and no deformity.   No tenderness to vertebral process palpation.   Paraspinous muscles are not tender and without spasm NTTP gluteal musculature Straight leg raise negative Trendelenberg negative Piriformis Test negative Gait normal  Centralize low back pain with lumbar extension    Electronically signed by:   Alyssa Le D.[facility] 8:41 AM 04/28/23

## 2023-04-28 NOTE — Patient Instructions (Addendum)
-   Start meloxicam 200 mg daily x2 a day .  If still having pain after 2 weeks, complete 3rd-week of Celebrex. May use remaining Celebrex as needed once daily for pain control.  Do not to use additional NSAIDs while taking Celebrex.  May use Tylenol [phone] mg 2 to 3 times a day for breakthrough pain. 3 week follow up

## 2023-04-28 NOTE — Addendum Note (Signed)
Addended by: Evon Slack on: 04/28/2023 08:48 AM   Modules accepted: Orders

## 2023-05-18 NOTE — Progress Notes (Unsigned)
    Aleen Sells D.[name] [address] [place] [address] Phone: [phone]   Assessment and Plan:     There are no diagnoses linked to this encounter.  ***   Pertinent previous records reviewed include ***    Follow Up: ***     Subjective:   I, Alyssa Le, am serving as a Neurosurgeon for Doctor Richardean Sale   Chief Complaint: low back pain    HPI:    02/15/2023 Alyssa Le is a 37 year old female complaining of low back pain. Alyssa Le states - pt has hx of back pain that started a few months ago. Was seen at [facility] and then by PCP. Marland Kitchen No known injury. No change in routine. Does have baby to lift. Pain radiates into butt or leg. Nothing provides relief- no improvement w/ patches or ibuprofen or heating pad.    Sciatica improved w/ Prednisone taper.  Unfortunately now that she is done w/ the medication, she feels her sxs are returning.  Will start daily Meloxicam has picked up hasn't started yet .    03/15/2023 Alyssa Le states that she is great    04/28/2023 Alyssa Le states states she was doing really well, but now she is in flare . She did an exercise class. She isnt able to put her socks on.  had to take a meloxicam yesterday    05/19/2023 Alyssa Le states   Relevant Historical Information: GERD Additional pertinent review of systems negative.   Current Outpatient Medications:    albuterol (VENTOLIN HFA) 108 (90 Base) MCG/ACT inhaler, Use 2 puffs twice daily for 4-6 days, then PRN q6h cough., Disp: 1 each, Rfl: 1   celecoxib (CELEBREX) 200 MG capsule, Take 1 capsule (200 mg total) by mouth 2 (two) times daily., Disp: 60 capsule, Rfl: 0   EPINEPHrine 0.3 mg/0.3 mL IJ SOAJ injection, EpiPen 2-Pak 0.3 mg/0.3 mL injection, auto-injector, Disp: , Rfl:    meloxicam (MOBIC) 15 MG tablet, Take 1 tablet (15 mg total) by mouth daily., Disp: 30 tablet, Rfl: 1   nitrofurantoin, macrocrystal-monohydrate, (MACROBID) 100 MG capsule, Take 1 capsule  (100 mg total) by mouth 2 (two) times daily., Disp: 14 capsule, Rfl: 0   predniSONE (DELTASONE) 10 MG tablet, 3 tabs x3 days and then 2 tabs x3 days and then 1 tab x3 days.  Take w/ food., Disp: 18 tablet, Rfl: 0   Objective:     There were no vitals filed for this visit.    There is no height or weight on file to calculate BMI.    Physical Exam:    ***   Electronically signed by:  Aleen Sells D.[name] 7:32 AM 05/18/23

## 2023-05-19 ENCOUNTER — Ambulatory Visit: Payer: BC Managed Care – PPO | Admitting: Sports Medicine

## 2023-06-02 ENCOUNTER — Ambulatory Visit: Payer: BC Managed Care – PPO | Admitting: Family Medicine

## 2023-06-02 VITALS — BP 102/70 | HR 82 | Temp 98.7°F | Wt 151.4 lb

## 2023-06-02 DIAGNOSIS — M545 Low back pain, unspecified: Secondary | ICD-10-CM | POA: Diagnosis not present

## 2023-06-02 DIAGNOSIS — M5441 Lumbago with sciatica, right side: Secondary | ICD-10-CM

## 2023-06-02 MED ORDER — METHYLPREDNISOLONE ACETATE 80 MG/ML IJ SUSP
80.0000 mg | Freq: Once | INTRAMUSCULAR | Status: AC
Start: 2023-06-02 — End: 2023-06-02
  Administered 2023-06-02: 80 mg via INTRAMUSCULAR

## 2023-06-02 MED ORDER — KETOROLAC TROMETHAMINE 60 MG/2ML IM SOLN
60.0000 mg | Freq: Once | INTRAMUSCULAR | Status: AC
  Administered 2023-06-02: 60 mg via INTRAMUSCULAR

## 2023-06-02 NOTE — Progress Notes (Signed)
OFFICE VISIT  06/02/2023  CC:  Chief Complaint  Patient presents with   Back Pain    Lower back pain going down right leg; started yesterday    Patient is a 37 y.o. female who presents for back pain.  HPI: Alyssa Le has a history of chronic low back pain without sciatica.  04/28/23 she saw Dr. Jean Rosenthal, [facility] MD, dx'd with discogenic back pain, was rx'd celebrex and given depomedrol 80mg  IM and toradol 60 IM. Lumbar spine radiographs 02/15/2023 showed mild degenerative joint changes at L5-S1.  Her back had improved and she had essentially been asymptomatic about a week after her last visit with Dr. Jean Rosenthal. Then she had acute onset of low back pain that radiates down into the buttocks region and anterolaterally down the the right thigh.  No tingling, numbness, or leg weakness.  Bowel and bladder function normal. No saddle anesthesia. Any motion hurts, even sitting on the right side of her buttock hurts. She is a mother of 5, has need to repeatedly bend and lift and finds her current pain incapacitating. Review of systems: No weight loss, no fevers or night sweats, no rash  Past Medical History:  Diagnosis Date   Acquired arm deformity 06/14/2013   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 Had surgery to lengthen bone when she was 37 y.o. R humerus is shorter than L, Has markedly decreased ROM in shoulder, had steroid injections in past w/minimal relief. Chronic R shoulder pain.   Allergy    multiple food & environmental allergies (tree pollens & fruits)   Anemia    Arthritis    exercise & allergy induced   Asthma    inhaler last used 2015   Closed displaced fracture of proximal phalanx of lesser toe of left foot 09/13/2016   Complication of anesthesia    post operative pulmonary edema   Diverticular disease of right side of colon 09/24/2014   Headache    History of postpartum hemorrhage    with last 2 deliveries   Hx of gastric ulcer     Kidney stone    Ovarian cyst, right    surgically removed. 37 y.o.   Stress incontinence of urine 05/18/2017    Past Surgical History:  Procedure Laterality Date   APPENDECTOMY     DILATATION & CURETTAGE/HYSTEROSCOPY WITH MYOSURE N/A 04/16/2021   Procedure: DILATATION & CURETTAGE/HYSTEROSCOPY WITH MYOSURE;  Surgeon: Edwinna Areola, DO;  Location: [facility] OR;  Service: Gynecology;  Laterality: N/A;   HUMERUS SURGERY Right    bone lengthening surgery 37 y.o due to osteomyelitis at growth plate as infant   OVARIAN CYST REMOVAL     stump appendectomy, Pulmonary edema post op    Outpatient Medications Prior to Visit  Medication Sig Dispense Refill   albuterol (VENTOLIN HFA) 108 (90 Base) MCG/ACT inhaler Use 2 puffs twice daily for 4-6 days, then PRN q6h cough. 1 each 1   celecoxib (CELEBREX) 200 MG capsule Take 1 capsule (200 mg total) by mouth 2 (two) times daily. 60 capsule 0   EPINEPHrine 0.3 mg/0.3 mL IJ SOAJ injection EpiPen 2-Pak 0.3 mg/0.3 mL injection, auto-injector     meloxicam (MOBIC) 15 MG tablet Take 1 tablet (15 mg total) by mouth daily. 30 tablet 1   nitrofurantoin, macrocrystal-monohydrate, (MACROBID) 100 MG capsule Take 1 capsule (100 mg total) by mouth 2 (two) times daily. (Patient not taking: Reported on 06/02/2023) 14 capsule 0   predniSONE (DELTASONE) 10 MG  tablet 3 tabs x3 days and then 2 tabs x3 days and then 1 tab x3 days.  Take w/ food. (Patient not taking: Reported on 06/02/2023) 18 tablet 0   No facility-administered medications prior to visit.    Allergies  Allergen Reactions   Amoxicillin-Pot Clavulanate Shortness Of Breath and Other (See Comments)    Reaction:  Chest pain  - pt cannot tolerate Augmentin - but can take Amoxicillin   Other Anaphylaxis and Other (See Comments)    Pt states that she is allergic to fresh fruit and tree nuts.     Tree Extract     Eye swelling   Cephalexin Rash   Latex Rash   Neosporin Original [Bacitracin-Neomycin-Polymyxin]  Rash   Perineal Cleansing [Balneol] Rash    Review of Systems  As per HPI  PE:    06/02/2023   10:54 AM 04/28/2023    8:26 AM 03/15/2023    9:51 AM  Vitals with BMI  Height  5\' 6"  5\' 6"   Weight 151 lbs 6 oz 156 lbs 156 lbs  BMI  25.19 25.19  Systolic 102 122 027  Diastolic 70 82 80  Pulse 82 86 106     Physical Exam  Gen: Alert, in mild distress due to her pain. Mild tenderness diffusely across the low back. Sitting straight leg raise positive about 45 degree angles on both sides.  The pain elicited is in the low back and buttock region bilaterally. Patellar and Achilles DTRs trace bilaterally. No lower extremity weakness.  LABS:  Last CBC Lab Results  Component Value Date   WBC 9.5 03/15/2022   HGB 10.6 (L) 03/15/2022   HCT 31.4 (L) 03/15/2022   MCV 86.0 03/15/2022   MCH 29.0 03/15/2022   RDW 13.6 03/15/2022   PLT 246 03/15/2022   Last metabolic panel Lab Results  Component Value Date   GLUCOSE 93 09/19/2014   NA 140 01/19/2017   K 4.3 01/19/2017   CL 104 09/19/2014   CO2 28 09/19/2014   BUN 13 01/19/2017   CREATININE 0.7 01/19/2017   GFRNONAA >90 09/19/2014   CALCIUM 8.8 09/19/2014   PROT 7.5 09/19/2014   ALBUMIN 4.1 09/19/2014   BILITOT 0.5 09/19/2014   ALKPHOS 44 01/19/2017   AST 17 01/19/2017   ALT 15 01/19/2017   ANIONGAP 7 09/19/2014   IMPRESSION AND PLAN:  Acute bilateral low back pain with right side sciatica. History of recurrent low back pain. She improved significantly with Depo-Medrol 80 mg and Toradol 60 mg at the time of her last exacerbation about 1 month ago. These were both given today here as well. Tomorrow she will start Celebrex 200 mg twice daily.  An After Visit Summary was printed and given to the patient.  FOLLOW UP: Return if symptoms worsen or fail to improve.  Signed:  Santiago Bumpers, MD           06/02/2023

## 2023-10-03 ENCOUNTER — Encounter: Payer: Self-pay | Admitting: Family Medicine

## 2023-10-03 ENCOUNTER — Ambulatory Visit (INDEPENDENT_AMBULATORY_CARE_PROVIDER_SITE_OTHER): Admitting: Family Medicine

## 2023-10-03 VITALS — BP 110/70 | HR 78 | Ht 66.0 in | Wt 143.6 lb

## 2023-10-03 DIAGNOSIS — Z0000 Encounter for general adult medical examination without abnormal findings: Secondary | ICD-10-CM | POA: Diagnosis not present

## 2023-10-03 DIAGNOSIS — N946 Dysmenorrhea, unspecified: Secondary | ICD-10-CM

## 2023-10-03 DIAGNOSIS — J301 Allergic rhinitis due to pollen: Secondary | ICD-10-CM

## 2023-10-03 DIAGNOSIS — Z13220 Encounter for screening for lipoid disorders: Secondary | ICD-10-CM

## 2023-10-03 DIAGNOSIS — Z131 Encounter for screening for diabetes mellitus: Secondary | ICD-10-CM | POA: Diagnosis not present

## 2023-10-03 DIAGNOSIS — M25511 Pain in right shoulder: Secondary | ICD-10-CM | POA: Insufficient documentation

## 2023-10-03 LAB — COMPREHENSIVE METABOLIC PANEL WITH GFR
ALT: 14 U/L (ref 0–35)
AST: 14 U/L (ref 0–37)
Albumin: 4.6 g/dL (ref 3.5–5.2)
Alkaline Phosphatase: 51 U/L (ref 39–117)
BUN: 18 mg/dL (ref 6–23)
CO2: 27 meq/L (ref 19–32)
Calcium: 9.2 mg/dL (ref 8.4–10.5)
Chloride: 104 meq/L (ref 96–112)
Creatinine, Ser: 0.61 mg/dL (ref 0.40–1.20)
GFR: 111.99 mL/min (ref >60.00)
Glucose, Bld: 79 mg/dL (ref 70–99)
Potassium: 4.3 meq/L (ref 3.5–5.1)
Sodium: 136 meq/L (ref 135–145)
Total Bilirubin: 0.5 mg/dL (ref 0.2–1.2)
Total Protein: 7 g/dL (ref 6.0–8.3)

## 2023-10-03 LAB — LIPID PANEL
Cholesterol: 188 mg/dL (ref 0–200)
HDL: 56.7 mg/dL (ref >39.00)
LDL Cholesterol: 108 mg/dL — ABNORMAL HIGH (ref 0–99)
NonHDL: 131.08
Total CHOL/HDL Ratio: 3
Triglycerides: 115 mg/dL (ref 0.0–149.0)
VLDL: 23 mg/dL (ref 0.0–40.0)

## 2023-10-03 LAB — CBC
HCT: 41.1 % (ref 36.0–46.0)
Hemoglobin: 13.7 g/dL (ref 12.0–15.0)
MCHC: 33.2 g/dL (ref 30.0–36.0)
MCV: 91.7 fl (ref 78.0–100.0)
Platelets: 300 10*3/uL (ref 150.0–400.0)
RBC: 4.49 Mil/uL (ref 3.87–5.11)
RDW: 12.6 % (ref 11.5–15.5)
WBC: 6.5 10*3/uL (ref 4.0–10.5)

## 2023-10-03 LAB — TSH: TSH: 0.83 u[IU]/mL (ref 0.35–5.50)

## 2023-10-03 LAB — HEMOGLOBIN A1C: Hgb A1c MFr Bld: 5.6 % (ref 4.6–6.5)

## 2023-10-03 MED ORDER — MONTELUKAST SODIUM 10 MG PO TABS: 10.0000 mg | ORAL_TABLET | Freq: Every day | ORAL | 3 refills | Status: AC

## 2023-10-03 NOTE — Progress Notes (Signed)
 Patient ID: Alyssa Le, female  DOB: [birth year], 38 y.o.   MRN: [id number] Patient Care Team    Relationship Specialty Notifications Start End  Alyssa Shope, DO PCP - General Family Medicine  04/27/16   Alyssa Riling, DO Consulting Physician Obstetrics and Gynecology  05/14/21     Chief Complaint  Patient presents with   Annual Exam    Non fasting CPE. Pt had completed pap  03-28-23 and it was normal. Will request records.    Subjective:  Alyssa Le is a 38 y.o.  Female  present for CPE. All past medical history, surgical history, allergies, family history, immunizations, medications and social history were updated in the electronic medical record today. All recent labs, ED visits and hospitalizations within the last year were reviewed.  Health maintenance:  Colon cancer screen: no fhx. Routine screen at 45 Mammogram: no fhx, routine screen at 40 > will have completed at gyn office.  Cervical cancer screening: last pap: 03/28/2023, results: requested, completed by: Dr. Sharman Le Immunizations: tdap UTD 2023, Influenza UTD (encouraged yearly) Infectious disease screening: HIV completed, Hep C completed DEXA: routine screen at 55-60 Hospitalizations/ED visits: reviewed  Shoulder discomfort: Patient reports she has noticed a small lump just above her right lateral collarbone.  She incidentally noticed this about a month ago.  She states it is not uncomfortable unless she pushes on it.  She states that she had an injury when she was very young and had surgery on her right collarbone, lengthening the collarbone secondary to growth plate injury.  Patient also reports her menstrual cycles have changed over the last few months.  She is noticing more cramping with her menstrual cycles and the cramping is more in her back.  Her periods do not last quite as long as they were before and not quite as heavy.  She has noticed some sleeping difficulties develop as well.       10/03/2023   10:44 AM 02/13/2023    8:57 AM 02/02/2023    9:59 AM 11/22/2022   11:06 AM 05/16/2022   10:57 AM  Depression screen PHQ 2/9  Decreased Interest 0 0 0 0 0  Down, Depressed, Hopeless 0 0 0 0 0  PHQ - 2 Score 0 0 0 0 0  Altered sleeping 1 0 0    Tired, decreased energy 1 0 0    Change in appetite 0 0 0    Feeling bad or failure about yourself  0 0 0    Trouble concentrating 0 0 0    Moving slowly or fidgety/restless 0 0 0    Suicidal thoughts 0 0 0    PHQ-9 Score 2 0 0    Difficult doing work/chores Not difficult at all Not difficult at all Not difficult at all        10/03/2023   10:44 AM  GAD 7 : Generalized Anxiety Score  Nervous, Anxious, on Edge 0  Control/stop worrying 0  Worry too much - different things 0  Trouble relaxing 0  Restless 0  Easily annoyed or irritable 0  Afraid - awful might happen 0  Total GAD 7 Score 0  Anxiety Difficulty Not difficult at all           Immunization History  Administered Date(s) Administered   Influenza Inj Mdck Quad Pf 02/21/2022   Influenza, Seasonal, Injecte, Preservative Fre 02/13/2023   Influenza,inj,Quad PF,6+ Mos 03/20/2018   PFIZER(Purple Top)SARS-COV-2 Vaccination 01/31/2020, 02/21/2020  Tdap 05/09/2014, 10/03/2017, 12/17/2021     Past Medical History:  Diagnosis Date   Acquired arm deformity 06/14/2013   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 Had surgery to lengthen bone when she was 38 y.o. R humerus is shorter than L, Has markedly decreased ROM in shoulder, had steroid injections in past w/minimal relief. Chronic R shoulder pain.   Allergy    multiple food & environmental allergies (tree pollens & fruits)   Anemia    Arthritis    exercise & allergy induced   Asthma    inhaler last used 2015   Closed displaced fracture of proximal phalanx of lesser toe of left foot 09/13/2016   Complication of anesthesia    post operative pulmonary edema   Diverticular  disease of right side of colon 09/24/2014   Headache    History of postpartum hemorrhage    with last 2 deliveries   Hx of gastric ulcer    Kidney stone    Ovarian cyst, right    surgically removed. 38 y.o.   Stress incontinence of urine 05/18/2017   Allergies  Allergen Reactions   Amoxicillin -Pot Clavulanate Shortness Of Breath and Other (See Comments)    Reaction:  Chest pain  - pt cannot tolerate Augmentin - but can take Amoxicillin    Other Anaphylaxis and Other (See Comments)    Pt states that she is allergic to fresh fruit and tree nuts.   Tree Extract     Eye swelling   Cephalexin Rash   Latex Rash   Neosporin Original [Bacitracin-Neomycin-Polymyxin] Rash   Perineal Cleansing [Balneol] Rash   Past Surgical History:  Procedure Laterality Date   APPENDECTOMY     DILATATION & CURETTAGE/HYSTEROSCOPY WITH MYOSURE N/A 04/16/2021   Procedure: DILATATION & CURETTAGE/HYSTEROSCOPY WITH MYOSURE;  Surgeon: Alyssa Riling, DO;  Location: [facility] OR;  Service: Gynecology;  Laterality: N/A;   HUMERUS SURGERY Right    bone lengthening surgery 38 y.o due to osteomyelitis at growth plate as infant   OVARIAN CYST REMOVAL     stump appendectomy, Pulmonary edema post op   Family History  Adopted: Yes  Problem Relation Age of Onset   Alcohol abuse Neg Hx    Arthritis Neg Hx    Asthma Neg Hx    Birth defects Neg Hx    Cancer Neg Hx    COPD Neg Hx    Depression Neg Hx    Diabetes Neg Hx    Drug abuse Neg Hx    Early death Neg Hx    Hearing loss Neg Hx    Heart disease Neg Hx    Hyperlipidemia Neg Hx    Hypertension Neg Hx    Kidney disease Neg Hx    Learning disabilities Neg Hx    Mental illness Neg Hx    Mental retardation Neg Hx    Miscarriages / Stillbirths Neg Hx    Stroke Neg Hx    Vision loss Neg Hx    Varicose Veins Neg Hx    Social History   Social History Narrative   Alyssa Le is from [place]. She lives with her husband, 2 small children, and  mother-in-law.She works part-time.  Patient is adopted.     Allergies as of 10/03/2023       Reactions   Amoxicillin -pot Clavulanate Shortness Of Breath, Other (See Comments)   Reaction:  Chest pain  - pt cannot tolerate Augmentin - but can take Amoxicillin   Other Anaphylaxis, Other (See Comments)   Pt states that she is allergic to fresh fruit and tree nuts.   Tree Extract    Eye swelling   Cephalexin Rash   Latex Rash   Neosporin Original [bacitracin-neomycin-polymyxin] Rash   Perineal Cleansing [balneol] Rash        Medication List        Accurate as of October 03, 2023  1:08 PM. If you have any questions, ask your nurse or doctor.          STOP taking these medications    albuterol  108 (90 Base) MCG/ACT inhaler Commonly known as: VENTOLIN  HFA Stopped by: Napolean Backbone   celecoxib  200 MG capsule Commonly known as: CeleBREX  Stopped by: Napolean Backbone       TAKE these medications    EPINEPHrine 0.3 mg/0.3 mL Soaj injection Commonly known as: EPI-PEN EpiPen 2-Pak 0.3 mg/0.3 mL injection, auto-injector   fexofenadine-pseudoephedrine 60-120 MG 12 hr tablet Commonly known as: ALLEGRA-D Take 1 tablet by mouth 2 (two) times daily.   fluticasone  50 MCG/ACT nasal spray Commonly known as: FLONASE  Place into both nostrils daily.   montelukast  10 MG tablet Commonly known as: SINGULAIR  Take 1 tablet (10 mg total) by mouth at bedtime.        All past medical history, surgical history, allergies, family history, immunizations andmedications were updated in the EMR today and reviewed under the history and medication portions of their EMR.     No results found for this or any previous visit (from the past 2160 hours).  US  Abdomen Limited RUQ (LIVER/GB) Result Date: 05/26/2022 CLINICAL DATA:  RIGHT UPPER QUADRANT PAIN EXAM: ULTRASOUND ABDOMEN LIMITED COMPARISON:  07/22/2010 FINDINGS: The liver demonstrates normal parenchymal echogenicity and homogeneous texture  without focal hepatic parenchymal lesions or intrahepatic ductal dilatation. The gallbladder demonstrates no stones, wall thickening or pericholecystic fluid. CBD measured 0.3cm. IMPRESSION: Unremarkable examination of the right upper quadrant. Electronically Signed   By: Sydell Eva M.D.   On: 05/26/2022 11:05     ROS 14 pt review of systems performed and negative (unless mentioned in an HPI)  Objective: BP 110/70   Pulse 78   Ht 5\' 6"  (1.676 m)   Wt 143 lb 9.6 oz (65.1 kg)   SpO2 97%   BMI 23.18 kg/m  Physical Exam Vitals and nursing note reviewed.  Constitutional:      General: She is not in acute distress.    Appearance: Normal appearance. She is not ill-appearing or toxic-appearing.  HENT:     Head: Normocephalic and atraumatic.     Right Ear: Tympanic membrane, ear canal and external ear normal. There is no impacted cerumen.     Left Ear: Tympanic membrane, ear canal and external ear normal. There is no impacted cerumen.     Nose: No congestion or rhinorrhea.     Mouth/Throat:     Mouth: Mucous membranes are moist.     Pharynx: Oropharynx is clear. No oropharyngeal exudate or posterior oropharyngeal erythema.  Eyes:     General: No scleral icterus.       Right eye: No discharge.        Left eye: No discharge.     Extraocular Movements: Extraocular movements intact.     Conjunctiva/sclera: Conjunctivae normal.     Pupils: Pupils are equal, round, and reactive to light.  Cardiovascular:     Rate and Rhythm: Normal rate and regular rhythm.     Pulses: Normal pulses.  Heart sounds: Normal heart sounds. No murmur heard.    No friction rub. No gallop.  Pulmonary:     Effort: Pulmonary effort is normal. No respiratory distress.     Breath sounds: Normal breath sounds. No stridor. No wheezing, rhonchi or rales.  Chest:     Chest wall: No tenderness.  Abdominal:     General: Abdomen is flat. Bowel sounds are normal. There is no distension.     Palpations: Abdomen  is soft. There is no mass.     Tenderness: There is no abdominal tenderness. There is no right CVA tenderness, left CVA tenderness, guarding or rebound.     Hernia: No hernia is present.  Musculoskeletal:        General: No swelling, tenderness or deformity. Normal range of motion.     Cervical back: Normal range of motion and neck supple. No rigidity or tenderness.     Right lower leg: No edema.     Left lower leg: No edema.  Lymphadenopathy:     Cervical: No cervical adenopathy.  Skin:    General: Skin is warm and dry.     Coloration: Skin is not jaundiced or pale.     Findings: No bruising, erythema, lesion or rash.  Neurological:     General: No focal deficit present.     Mental Status: She is alert and oriented to person, place, and time. Mental status is at baseline.     Cranial Nerves: No cranial nerve deficit.     Sensory: No sensory deficit.     Motor: No weakness.     Coordination: Coordination normal.     Gait: Gait normal.     Deep Tendon Reflexes: Reflexes normal.  Psychiatric:        Mood and Affect: Mood normal.        Behavior: Behavior normal.        Thought Content: Thought content normal.        Judgment: Judgment normal.        No results found.  Assessment/plan: Chelsia A Salvia is a 38 y.o. female present for CPE, chronic conditions and acute concerns Routine general medical examination at a health care facility (Primary) Patient was encouraged to exercise greater than 150 minutes a week. Patient was encouraged to choose a diet filled with fresh fruits and vegetables, and lean meats. AVS provided to patient today for education/recommendation on gender specific health and safety maintenance. - CBC - Comprehensive metabolic panel with GFR - TColon cancer screen: no fhx. Routine screen at 45 Mammogram: no fhx, routine screen at 40 > will have completed at gyn office.  Cervical cancer screening: last pap: 03/28/2023, results: requested, completed by: Dr.  Sharman Le Immunizations: tdap UTD 2023, Influenza UTD (encouraged yearly) Infectious disease screening: HIV completed, Hep C completed DEXA: routine screen at 55-60SH   Lipid screening/Diabetes mellitus screening - Hemoglobin A1c - Lipid panel   Seasonal allergic rhinitis due to pollen Continue OTC antihistamine Continue Singulair  nightly  Acute pain of right shoulder Area of concern is a ligament.  Reassured patient.  Possibly more prominent now secondary to body changes after pregnancy or physically carrying her children Encouraged her to continue to monitor for any growth or pain that her.  Follow-up at time occurs.  Menstrual cramps We discussed changes she is seen on her menstrual cycle could be caused by normal changes that occur in cycles over time/age.  We also discussed hormonal changes including thyroid  hormone changes.  TSH was  collected today. She is established with gynecology, and is evaluated by them yearly, encouraged her to discuss the changes she is appreciating with her gynecology team.   Return in about 1 year (around 10/03/2024) for cpe (20 min), Routine chronic condition follow-up.    Orders Placed This Encounter  Procedures   CBC   Comprehensive metabolic panel with GFR   Hemoglobin A1c   Lipid panel   TSH   HM PAP SMEAR   Meds ordered this encounter  Medications   montelukast  (SINGULAIR ) 10 MG tablet    Sig: Take 1 tablet (10 mg total) by mouth at bedtime.    Dispense:  90 tablet    Refill:  3   Referral Orders  No referral(s) requested today     Electronically signed by: Napolean Backbone, DO [facility]

## 2023-10-03 NOTE — Patient Instructions (Addendum)

## 2023-10-04 ENCOUNTER — Encounter: Payer: Self-pay | Admitting: Family Medicine

## 2023-10-29 ENCOUNTER — Other Ambulatory Visit: Payer: Self-pay

## 2023-10-29 ENCOUNTER — Emergency Department (HOSPITAL_COMMUNITY)

## 2023-10-29 ENCOUNTER — Encounter (HOSPITAL_BASED_OUTPATIENT_CLINIC_OR_DEPARTMENT_OTHER): Payer: Self-pay | Admitting: Emergency Medicine

## 2023-10-29 ENCOUNTER — Emergency Department (HOSPITAL_BASED_OUTPATIENT_CLINIC_OR_DEPARTMENT_OTHER)
Admission: EM | Admit: 2023-10-29 | Discharge: 2023-10-29 | Disposition: A | Discharge status: Home / Self Care | Attending: Emergency Medicine | Admitting: Emergency Medicine

## 2023-10-29 DIAGNOSIS — R319 Hematuria, unspecified: Secondary | ICD-10-CM | POA: Diagnosis not present

## 2023-10-29 DIAGNOSIS — N390 Urinary tract infection, site not specified: Secondary | ICD-10-CM | POA: Diagnosis not present

## 2023-10-29 DIAGNOSIS — Z7951 Long term (current) use of inhaled steroids: Secondary | ICD-10-CM | POA: Diagnosis not present

## 2023-10-29 DIAGNOSIS — Z91040 Latex allergy status: Secondary | ICD-10-CM | POA: Insufficient documentation

## 2023-10-29 DIAGNOSIS — R1031 Right lower quadrant pain: Secondary | ICD-10-CM | POA: Diagnosis not present

## 2023-10-29 DIAGNOSIS — R310 Gross hematuria: Secondary | ICD-10-CM | POA: Diagnosis not present

## 2023-10-29 DIAGNOSIS — J45909 Unspecified asthma, uncomplicated: Secondary | ICD-10-CM | POA: Diagnosis not present

## 2023-10-29 DIAGNOSIS — N3289 Other specified disorders of bladder: Secondary | ICD-10-CM | POA: Diagnosis not present

## 2023-10-29 DIAGNOSIS — B379 Candidiasis, unspecified: Secondary | ICD-10-CM

## 2023-10-29 DIAGNOSIS — N200 Calculus of kidney: Secondary | ICD-10-CM | POA: Diagnosis not present

## 2023-10-29 HISTORY — DX: Spondylosis without myelopathy or radiculopathy, lumbar region: M47.816

## 2023-10-29 LAB — COMPREHENSIVE METABOLIC PANEL WITH GFR
ALT: 17 U/L (ref 0–44)
AST: 17 U/L (ref 15–41)
Albumin: 4.6 g/dL (ref 3.5–5.0)
Alkaline Phosphatase: 51 U/L (ref 38–126)
Anion gap: 10 (ref 5–15)
BUN: 12 mg/dL (ref 6–20)
CO2: 21 mmol/L — ABNORMAL LOW (ref 22–32)
Calcium: 9.4 mg/dL (ref 8.9–10.3)
Chloride: 105 mmol/L (ref 98–111)
Creatinine, Ser: 0.58 mg/dL (ref 0.44–1.00)
GFR, Estimated: >60 mL/min (ref >60)
Glucose, Bld: 89 mg/dL (ref 70–99)
Potassium: 4 mmol/L (ref 3.5–5.1)
Sodium: 136 mmol/L (ref 135–145)
Total Bilirubin: 0.7 mg/dL (ref 0.0–1.2)
Total Protein: 7.8 g/dL (ref 6.5–8.1)

## 2023-10-29 LAB — URINALYSIS, ROUTINE W REFLEX MICROSCOPIC
Bilirubin Urine: NEGATIVE
Glucose, UA: NEGATIVE mg/dL
Ketones, ur: NEGATIVE mg/dL
Leukocytes,Ua: NEGATIVE
Nitrite: NEGATIVE
Protein, ur: 30 mg/dL — AB
RBC / HPF: >50 RBC/hpf (ref 0–5)
Specific Gravity, Urine: 1.012 (ref 1.005–1.030)
pH: 5 (ref 5.0–8.0)

## 2023-10-29 LAB — LIPASE, BLOOD: Lipase: 34 U/L (ref 11–51)

## 2023-10-29 LAB — CBC WITH DIFFERENTIAL/PLATELET
Abs Immature Granulocytes: 0.02 10*3/uL (ref 0.00–0.07)
Basophils Absolute: 0.1 10*3/uL (ref 0.0–0.1)
Basophils Relative: 1 %
Eosinophils Absolute: 0.1 10*3/uL (ref 0.0–0.5)
Eosinophils Relative: 2 %
HCT: 42.8 % (ref 36.0–46.0)
Hemoglobin: 14.2 g/dL (ref 12.0–15.0)
Immature Granulocytes: 0 %
Lymphocytes Relative: 33 %
Lymphs Abs: 2 10*3/uL (ref 0.7–4.0)
MCH: 30.3 pg (ref 26.0–34.0)
MCHC: 33.2 g/dL (ref 30.0–36.0)
MCV: 91.5 fL (ref 80.0–100.0)
Monocytes Absolute: 0.5 10*3/uL (ref 0.1–1.0)
Monocytes Relative: 8 %
Neutro Abs: 3.4 10*3/uL (ref 1.7–7.7)
Neutrophils Relative %: 56 %
Platelets: 270 10*3/uL (ref 150–400)
RBC: 4.68 MIL/uL (ref 3.87–5.11)
RDW: 12.5 % (ref 11.5–15.5)
WBC: 6 10*3/uL (ref 4.0–10.5)
nRBC: 0 % (ref 0.0–0.2)

## 2023-10-29 LAB — HCG, SERUM, QUALITATIVE: Preg, Serum: NEGATIVE

## 2023-10-29 MED ORDER — NITROFURANTOIN MONOHYD MACRO 100 MG PO CAPS: 100.0000 mg | ORAL_CAPSULE | Freq: Two times a day (BID) | ORAL | 0 refills | Status: DC

## 2023-10-29 MED ORDER — FLUCONAZOLE 150 MG PO TABS
150.0000 mg | ORAL_TABLET | Freq: Every day | ORAL | 0 refills | Status: AC
Start: 2023-10-29 — End: 2023-10-31

## 2023-10-29 MED ORDER — IOHEXOL 300 MG/ML  SOLN
100.0000 mL | Freq: Once | INTRAMUSCULAR | Status: AC | PRN
  Administered 2023-10-29: 100 mL via INTRAVENOUS

## 2023-10-29 NOTE — ED Notes (Signed)
 Pt hives/redness along jawline improving, pt declining benedryl at this time, PA at bedside, PRN order of benedryl ordered if pt reaction returns

## 2023-10-29 NOTE — ED Provider Notes (Signed)
 [place] EMERGENCY DEPARTMENT AT [facility] Provider Note   CSN: [id number] Arrival date & time: 10/29/23  1117     History  Chief Complaint  Patient presents with   Hematuria    Alyssa Le is a 38 y.o. female.  With past medical history of kidney stones, asthma, right-sided diverticulosis presenting to emergency room with complaint of bilateral low back pain and hematuria and abdominal pain.  Patient reports that her right side low back hurts worse than her left.  Has history of back pain and this feels quite similar.  Denies any red flag symptoms associated with back pain.  Has not noticed any dysuria, fever nausea vomiting diarrhea.  On abdominal exam at urgent care had right lower quadrant abdominal pain.  She was thus sent for CT scan.   Hematuria       Home Medications Prior to Admission medications   Medication Sig Start Date End Date Taking? Authorizing Provider  EPINEPHrine 0.3 mg/0.3 mL IJ SOAJ injection EpiPen 2-Pak 0.3 mg/0.3 mL injection, auto-injector    [provider]  fexofenadine-pseudoephedrine (ALLEGRA-D) 60-120 MG 12 hr tablet Take 1 tablet by mouth 2 (two) times daily.    [provider]  fluticasone  (FLONASE ) 50 MCG/ACT nasal spray Place into both nostrils daily.    [provider]  montelukast  (SINGULAIR ) 10 MG tablet Take 1 tablet (10 mg total) by mouth at bedtime. 10/03/23   Kuneff, Renee A, DO      Allergies    Amoxicillin -pot clavulanate, Other, Tree extract, Cephalexin, Latex, Neosporin original [bacitracin-neomycin-polymyxin], and Perineal cleansing [balneol]    Review of Systems   Review of Systems  Genitourinary:  Positive for hematuria.    Physical Exam Updated Vital Signs BP 108/74   Pulse 75   Temp 98.3 F (36.8 C) (Oral)   Resp 18   LMP 10/10/2023   SpO2 97%  Physical Exam Vitals and nursing note reviewed.  Constitutional:      General: She is not in acute distress.    Appearance: She  is not toxic-appearing.  HENT:     Head: Normocephalic and atraumatic.  Eyes:     General: No scleral icterus.    Conjunctiva/sclera: Conjunctivae normal.  Cardiovascular:     Rate and Rhythm: Normal rate and regular rhythm.     Pulses: Normal pulses.     Heart sounds: Normal heart sounds.  Pulmonary:     Effort: Pulmonary effort is normal. No respiratory distress.     Breath sounds: Normal breath sounds.  Abdominal:     General: Abdomen is flat. Bowel sounds are normal.     Palpations: Abdomen is soft.     Tenderness: There is abdominal tenderness. There is no right CVA tenderness or left CVA tenderness.     Comments: RLQ TTP  Musculoskeletal:     Right lower leg: No edema.     Left lower leg: No edema.  Skin:    General: Skin is warm and dry.     Findings: No lesion.  Neurological:     General: No focal deficit present.     Mental Status: She is alert and oriented to person, place, and time. Mental status is at baseline.     ED Results / Procedures / Treatments   Labs (all labs ordered are listed, but only abnormal results are displayed) Labs Reviewed  COMPREHENSIVE METABOLIC PANEL WITH GFR - Abnormal; Notable for the following components:      Result Value  CO2 21 (*)    All other components within normal limits  URINALYSIS, ROUTINE W REFLEX MICROSCOPIC - Abnormal; Notable for the following components:   APPearance HAZY (*)    Hgb urine dipstick LARGE (*)    Protein, ur 30 (*)    Bacteria, UA RARE (*)    All other components within normal limits  LIPASE, BLOOD  CBC WITH DIFFERENTIAL/PLATELET  HCG, SERUM, QUALITATIVE    EKG None  Radiology No results found.  Procedures Procedures    Medications Ordered in ED Medications - No data to display  ED Course/ Medical Decision Making/ A&P                                 Medical Decision Making Amount and/or Complexity of Data Reviewed Labs: ordered. Radiology: ordered.  Risk Prescription drug  management.   This patient presents to the ED for concern of abdominal pain, this involves an extensive number of treatment options, and is a complaint that carries with it a high risk of complications and morbidity.  The differential diagnosis includes kidney stone, pancreatitis, uti, appendicitis    Co morbidities that complicate the patient evaluation  GERD, asthma, kidney stone   Additional history obtained:  Additional history obtained from reviewed ED visit patient transferred for CT scan.    Lab Tests:  I personally interpreted labs.  The pertinent results include:   CBC without leukocytosis no significant anemia.  CMP with normal kidney function.  Lipase within normal limits and pregnancy test negative UA positive for hemoglobin, white blood cells, bacteria and budding yeast.    Imaging Studies ordered:  I ordered imaging studies including CT abd/pelvis  I independently visualized and interpreted imaging which showed no acute finding  I agree with the radiologist interpretation   Cardiac Monitoring: / EKG:  The patient was maintained on a cardiac monitor.   Problem List / ED Course / Critical interventions / Medication management  Patient presenting to emergency room after visiting urgent care today with hematuria right lower quadrant abdominal pain and bilateral back pain.  On arrival she is hemodynamically stable and well-appearing.  She has not had fever.  She is not on blood thinner.  Denies any specific dysuria.  She has no red flag symptoms associate with her back pain.  On exam she does have reproducible right lower quadrant pain.  She has not noticed any change in her bowel movements and she is tolerating oral intake.  Her labs are overall reassuring without any anemia.  Her UA does show urinary tract infection as well as budding yeast in urine.  Patient does not want any nausea or pain medicine.  Will obtain CT scan to further evaluate. Overall reassuring workup.   UA does suggest infection which I will treat.  Hemoglobin is stable.  Vitals are stable.  Patient will follow-up with urology for persistent hematuria and follow-up with primary care as well.  Given return precautions. I have reviewed the patients home medicines and have made adjustments as needed   Plan  F/u w/ PCP in 2-3d to ensure resolution of sx.  Patient was given return precautions. Patient stable for discharge at this time.  Patient educated on sx/dx and verbalized understanding of plan. Return to ER w/ new or worsening sx.          Final Clinical Impression(s) / ED Diagnoses Final diagnoses:  Hematuria, unspecified type  Lower urinary tract  infectious disease  Yeast infection    Rx / DC Orders ED Discharge Orders          Ordered    nitrofurantoin , macrocrystal-monohydrate, (MACROBID ) 100 MG capsule  2 times daily        10/29/23 1518    fluconazole (DIFLUCAN) 150 MG tablet  Daily        10/29/23 1518              Bailyn Spackman, Kandace Organ, PA-C 10/29/23 1528    Iva Mariner, MD 10/29/23 1700

## 2023-10-29 NOTE — ED Triage Notes (Signed)
 Blood in urine x few day Some lower back pain. Seen at [facility] and sent for a CT UA done at [facility].  Hx kidney stone

## 2023-10-29 NOTE — ED Notes (Signed)
 Stepped into the room to evaluate a reaction to IV contrast. Patient reports feeling more flushed some red itchy spots on her face.  I do see see them along the jaw line.  Patient denies any SOB or respiratory symptoms and visitor in the room denies any changes in the quality of her voice. Informed Jamie, the PA, regarding her symptoms.

## 2023-10-29 NOTE — ED Provider Notes (Signed)
 [place] EMERGENCY DEPARTMENT AT [facility] Provider Note   CSN: [id number] Arrival date & time: 10/29/23  1117     History  Chief Complaint  Patient presents with   Hematuria    Alyssa Le is a 38 y.o. female.  38 year old female with history of renal colic pneumaturia presents due to worsening blood in her urine today.  Seen at urgent care and had a urinalysis that was negative for infection but did show hematuria.  Concern for renal colic.  Patient denies any fever or chills or nausea or vomiting.  Pain has been for about 3 days.  Sent here for CT scan       Home Medications Prior to Admission medications   Medication Sig Start Date End Date Taking? Authorizing Provider  EPINEPHrine 0.3 mg/0.3 mL IJ SOAJ injection EpiPen 2-Pak 0.3 mg/0.3 mL injection, auto-injector    [provider]  fexofenadine-pseudoephedrine (ALLEGRA-D) 60-120 MG 12 hr tablet Take 1 tablet by mouth 2 (two) times daily.    [provider]  fluticasone  (FLONASE ) 50 MCG/ACT nasal spray Place into both nostrils daily.    [provider]  montelukast  (SINGULAIR ) 10 MG tablet Take 1 tablet (10 mg total) by mouth at bedtime. 10/03/23   Kuneff, Renee A, DO      Allergies    Amoxicillin -pot clavulanate, Other, Tree extract, Cephalexin, Latex, Neosporin original [bacitracin-neomycin-polymyxin], and Perineal cleansing [balneol]    Review of Systems   Review of Systems  All other systems reviewed and are negative.   Physical Exam Updated Vital Signs BP 108/74   Pulse 75   Temp 98.3 F (36.8 C) (Oral)   Resp 18   LMP 10/10/2023   SpO2 97%  Physical Exam Vitals and nursing note reviewed.  Constitutional:      General: She is not in acute distress.    Appearance: Normal appearance. She is well-developed. She is not toxic-appearing.  HENT:     Head: Normocephalic and atraumatic.  Eyes:     General: Lids are normal.     Conjunctiva/sclera: Conjunctivae normal.      Pupils: Pupils are equal, round, and reactive to light.  Neck:     Thyroid : No thyroid  mass.     Trachea: No tracheal deviation.  Cardiovascular:     Rate and Rhythm: Normal rate and regular rhythm.     Heart sounds: Normal heart sounds. No murmur heard.    No gallop.  Pulmonary:     Effort: Pulmonary effort is normal. No respiratory distress.     Breath sounds: Normal breath sounds. No stridor. No decreased breath sounds, wheezing, rhonchi or rales.  Abdominal:     General: There is no distension.     Palpations: Abdomen is soft.     Tenderness: There is no abdominal tenderness. There is no rebound.  Musculoskeletal:        General: No tenderness. Normal range of motion.     Cervical back: Normal range of motion and neck supple.  Skin:    General: Skin is warm and dry.     Findings: No abrasion or rash.  Neurological:     Mental Status: She is alert and oriented to person, place, and time. Mental status is at baseline.     GCS: GCS eye subscore is 4. GCS verbal subscore is 5. GCS motor subscore is 6.     Cranial Nerves: No cranial nerve deficit.     Sensory: No sensory deficit.  Motor: Motor function is intact.  Psychiatric:        Attention and Perception: Attention normal.        Speech: Speech normal.        Behavior: Behavior normal.     ED Results / Procedures / Treatments   Labs (all labs ordered are listed, but only abnormal results are displayed) Labs Reviewed - No data to display  EKG None  Radiology No results found.  Procedures Procedures    Medications Ordered in ED Medications - No data to display  ED Course/ Medical Decision Making/ A&P                                 Medical Decision Making  Unfortunately, our CAT scan is down today.  Have sent secure chat message to Dr. Kermit Ped at [facility] where patient will go to have her CAT scan.        Final Clinical Impression(s) / ED Diagnoses Final diagnoses:  None    Rx / DC  Orders ED Discharge Orders     None         Lind Repine, MD 10/29/23 1146

## 2023-10-29 NOTE — ED Notes (Signed)
 pt gave urine sample she stated blood clot was present in urine

## 2023-10-29 NOTE — Discharge Instructions (Addendum)
 Please take one Diflucan today, repeat in 72 hours if symptoms continue.  Take antibiotic as prescribed. Follow up with urology.

## 2023-10-31 ENCOUNTER — Telehealth: Payer: Self-pay | Admitting: Family Medicine

## 2023-10-31 NOTE — Telephone Encounter (Unsigned)
 Copied from CRM [id number]. Topic: Clinical - Medication Refill >> Oct 31, 2023 10:49 AM Dewanda Foots wrote: Medication: nitrofurantoin , macrocrystal-monohydrate, (MACROBID ) 100 MG capsule  Has the patient contacted their pharmacy? Yes (Agent: If no, request that the patient contact the pharmacy for the refill. If patient does not wish to contact the pharmacy document the reason why and proceed with request.) (Agent: If yes, when and what did the pharmacy advise?)  *Pt states that she was seen at the ER for blood in urine on Sunday 5/25 and was sent home with this medication however, her dog ate 5 of the pills and she is unable to get any refilled for it. Could not contact the ED Dr and pharmacy will not refill.   This is the patient's preferred pharmacy:  [id number] [place], [place] - [address] [place] [address] Phone: [phone] Fax: [phone]  Is this the correct pharmacy for this prescription? Yes If no, delete pharmacy and type the correct one.   Has the prescription been filled recently? Yes  Is the patient out of the medication? No  Has the patient been seen for an appointment in the last year OR does the patient have an upcoming appointment? Yes  Can we respond through MyChart? Yes  Agent: Please be advised that Rx refills may take up to 3 business days. We ask that you follow-up with your pharmacy.

## 2023-11-01 MED ORDER — NITROFURANTOIN MONOHYD MACRO 100 MG PO CAPS: 100.0000 mg | ORAL_CAPSULE | Freq: Two times a day (BID) | ORAL | 0 refills | Status: DC

## 2023-11-01 NOTE — Telephone Encounter (Signed)
 Patient called back to follow-up. She is concerned about the infection getting worse if she doesn't get the meds by today.

## 2023-11-01 NOTE — Telephone Encounter (Signed)
 Pt advised.

## 2023-11-01 NOTE — Telephone Encounter (Addendum)
 Please advise if in office appt or virtual is appropriate  *Pt states that she was seen at the ER for blood in urine on Sunday 5/25 and was sent home with this medication however, her dog ate 5 of the pills and she is unable to get any refilled for it. Could not contact the ED Dr and pharmacy will not refill.

## 2023-11-01 NOTE — Telephone Encounter (Signed)
 I filled the Macrobid  for her

## 2023-11-16 ENCOUNTER — Ambulatory Visit (INDEPENDENT_AMBULATORY_CARE_PROVIDER_SITE_OTHER): Admitting: Sports Medicine

## 2023-11-16 VITALS — BP 100/60 | HR 91 | Ht 66.0 in

## 2023-11-16 DIAGNOSIS — G8929 Other chronic pain: Secondary | ICD-10-CM | POA: Diagnosis not present

## 2023-11-16 DIAGNOSIS — M51372 Other intervertebral disc degeneration, lumbosacral region with discogenic back pain and lower extremity pain: Secondary | ICD-10-CM

## 2023-11-16 MED ORDER — CELECOXIB 200 MG PO CAPS: 200.0000 mg | ORAL_CAPSULE | Freq: Two times a day (BID) | ORAL | 0 refills | Status: DC

## 2023-11-16 NOTE — Progress Notes (Signed)
 Ben Katelynd Blauvelt D.[facility] [address] [place] [address] Phone: [phone]   Assessment and Plan:     1. Chronic bilateral low back pain with right-sided sciatica 2. Degeneration of intervertebral disc of lumbosacral region with discogenic back pain and lower extremity pain -Chronic with exacerbation, subsequent visit - Consistent with flare of lumbosacral discogenic pain, likely from increased physical activity and new workout - Based on recurrence of pain, degenerative changes seen on prior x-ray, recurring pain despite >6 weeks of conservative therapy, pain at times >6/10, pain with day-to-day activities, recommend further evaluation with lumbar MRI - Start Celebrex  200 mg twice daily x2 weeks.  If still having pain after 2 weeks, complete 3rd-week of NSAID. May use remaining NSAID as needed once daily for pain control.  Do not to use additional over-the-counter NSAIDs (ibuprofen , naproxen, Advil , Aleve, etc.) while taking prescription NSAIDs.  May use Tylenol  [phone] mg 2 to 3 times a day for breakthrough pain.  Patient was not able to tolerate meloxicam  in the past due to GI discomfort.  Recommend taking Celebrex  with food, PPI - Continue physical activity and HEP as tolerated  Pertinent previous records reviewed include none  Follow Up: 1 week after MRI to review results and discuss treatment plan   Subjective:   I, Adrienne Horning, am serving as a scribe for Dr. Ulysees Gander  Chief Complaint: low back pain    HPI:   02/15/2023 Patient is a 38 year old female complaining of low back pain. Patient states - pt has hx of back pain that started a few months ago. Was seen at [facility] and then by PCP. Aaron Aas No known injury. No change in routine. Does have baby to lift. Pain radiates into butt or leg. Nothing provides relief- no improvement w/ patches or ibuprofen  or heating pad.    Sciatica improved w/ Prednisone  taper.  Unfortunately now that  she is done w/ the medication, she feels her sxs are returning.  Will start daily Meloxicam  has picked up hasn't started yet .    03/15/2023 Patient states that she is great    04/28/2023 Patient states states she was doing really well, but now she is in flare . She did an exercise class. She isnt able to put her socks on.  had to take a meloxicam  yesterday    11/16/23 Patient states she had no pain up until three weeks ago and then it came back pretty bad. Started the Celebrex  has been about 5 days of that and has noticed some improvement. Started doing heavier weights and thinks that may have done it. Patient was doing very well with her piliates and PT workouts so was trying to do a slight increase in weights.   Relevant Historical Information: GERD  Additional pertinent review of systems negative.   Current Outpatient Medications:    celecoxib  (CELEBREX ) 200 MG capsule, Take 1 capsule (200 mg total) by mouth 2 (two) times daily., Disp: 60 capsule, Rfl: 0   EPINEPHrine 0.3 mg/0.3 mL IJ SOAJ injection, EpiPen 2-Pak 0.3 mg/0.3 mL injection, auto-injector, Disp: , Rfl:    fexofenadine-pseudoephedrine (ALLEGRA-D) 60-120 MG 12 hr tablet, Take 1 tablet by mouth 2 (two) times daily., Disp: , Rfl:    fluticasone  (FLONASE ) 50 MCG/ACT nasal spray, Place into both nostrils daily., Disp: , Rfl:    montelukast  (SINGULAIR ) 10 MG tablet, Take 1 tablet (10 mg total) by mouth at bedtime., Disp: 90 tablet, Rfl: 3   nitrofurantoin ,  macrocrystal-monohydrate, (MACROBID ) 100 MG capsule, Take 1 capsule (100 mg total) by mouth 2 (two) times daily., Disp: 10 capsule, Rfl: 0   Objective:     Vitals:   11/16/23 0849  BP: 100/60  Pulse: 91  SpO2: 98%  Height: 5' 6 (1.676 m)      Body mass index is 23.18 kg/m.    Physical Exam:    Gen: Appears well, nad, nontoxic and pleasant Psych: Alert and oriented, appropriate mood and affect Neuro: sensation intact, strength is 5/5 in upper and lower extremities,  muscle tone wnl Skin: no susupicious lesions or rashes  Back - Normal skin, Spine with normal alignment and no deformity.   No tenderness to vertebral process palpation.   Bilateral lumbar paraspinous muscles are   tender and without spasm, worse on right NTTP gluteal musculature Straight leg raise positive right Trendelenberg negative Piriformis Test negative Gait normal  Central to right low back pain with lumbar extension  Electronically signed by:  Marshall Skeeter D.[facility] 9:24 AM 11/16/23

## 2023-11-16 NOTE — Patient Instructions (Addendum)
 Good to see you Refill of Celebrex    - Start Celebrex  200 mg  daily x2 weeks.  If still having pain after 2 weeks, complete 3rd-week of NSAID. May use remaining NSAID as needed once daily for pain control.  Do not to use additional over-the-counter NSAIDs (ibuprofen , naproxen, Advil , Aleve, etc.) while taking prescription NSAIDs.  May use Tylenol  [phone] mg 2 to 3 times a day for breakthrough pain.  MRI lumbar spine ordered  Follow up 1 week after MRI

## 2023-11-18 ENCOUNTER — Ambulatory Visit

## 2023-11-18 DIAGNOSIS — M51372 Other intervertebral disc degeneration, lumbosacral region with discogenic back pain and lower extremity pain: Secondary | ICD-10-CM

## 2023-11-18 DIAGNOSIS — M5441 Lumbago with sciatica, right side: Secondary | ICD-10-CM | POA: Diagnosis not present

## 2023-11-18 DIAGNOSIS — G8929 Other chronic pain: Secondary | ICD-10-CM

## 2023-11-24 ENCOUNTER — Encounter: Payer: Self-pay | Admitting: Family Medicine

## 2023-11-24 ENCOUNTER — Ambulatory Visit: Admitting: Family Medicine

## 2023-11-24 VITALS — BP 112/70 | HR 83 | Temp 98.4°F | Wt 145.0 lb

## 2023-11-24 DIAGNOSIS — N912 Amenorrhea, unspecified: Secondary | ICD-10-CM | POA: Diagnosis not present

## 2023-11-24 DIAGNOSIS — R350 Frequency of micturition: Secondary | ICD-10-CM | POA: Diagnosis not present

## 2023-11-24 DIAGNOSIS — R319 Hematuria, unspecified: Secondary | ICD-10-CM

## 2023-11-24 LAB — POC URINALSYSI DIPSTICK (AUTOMATED)
Bilirubin, UA: NEGATIVE
Glucose, UA: NEGATIVE
Ketones, UA: NEGATIVE
Leukocytes, UA: NEGATIVE
Nitrite, UA: NEGATIVE
Protein, UA: POSITIVE — AB
Spec Grav, UA: 1.02 (ref 1.010–1.025)
Urobilinogen, UA: NEGATIVE U/dL — AB
pH, UA: 6 (ref 5.0–8.0)

## 2023-11-24 LAB — POCT URINE PREGNANCY: Preg Test, Ur: NEGATIVE

## 2023-11-24 MED ORDER — SULFAMETHOXAZOLE-TRIMETHOPRIM 800-160 MG PO TABS
1.0000 | ORAL_TABLET | Freq: Two times a day (BID) | ORAL | 0 refills | Status: AC
Start: 2023-11-24 — End: 2023-11-29

## 2023-11-24 MED ORDER — TAMSULOSIN HCL 0.4 MG PO CAPS: 0.4000 mg | ORAL_CAPSULE | Freq: Every day | ORAL | 3 refills | Status: AC

## 2023-11-24 NOTE — Progress Notes (Signed)
 Alyssa Le , 08-12-1985, 38 y.o., female MRN: [id number] Patient Care Team    Relationship Specialty Notifications Start End  Alyssa Shope, DO PCP - General Family Medicine  04/27/16   Alyssa Riling, DO Consulting Physician Obstetrics and Gynecology  05/14/21   Pa, [facility]    11/24/23   Alyssa Gander, DO Consulting Physician Sports Medicine  11/24/23     Chief Complaint  Patient presents with   Urinary Frequency    A few days; urinary frequency. No other sx. Pt has not taken anything to relieve sx.      Subjective: Alyssa Le is a 38 y.o. Pt presents for an OV with complaints of urinary frequency of few days duration.  Patient reports she was treated for a UTI when she was seen in the urgent care at the end of May.  Treated with Macrobid .  During that visit she was found to have x 2 kidney stones.  She reports when she reported to the emergency room she has had about 4 to 5 days of blood in her urine.  Urine pathology was not sent. Patient has made an appointment with [facility] considering the hematuria and kidney stones.  She has been a patient of theirs in the past. Denies any fever, chills, nausea, vomit or dysuria today. CT 10/2023 largest 4 mm calcification, lower pole left renal collecting system; on the right , 3 mm calc peripherally in the upper pole. Urinary bladder incompletely distended.     10/03/2023   10:44 AM 02/13/2023    8:57 AM 02/02/2023    9:59 AM 11/22/2022   11:06 AM 05/16/2022   10:57 AM  Depression screen PHQ 2/9  Decreased Interest 0 0 0 0 0  Down, Depressed, Hopeless 0 0 0 0 0  PHQ - 2 Score 0 0 0 0 0  Altered sleeping 1 0 0    Tired, decreased energy 1 0 0    Change in appetite 0 0 0    Feeling bad or failure about yourself  0 0 0    Trouble concentrating 0 0 0    Moving slowly or fidgety/restless 0 0 0    Suicidal thoughts 0 0 0    PHQ-9 Score 2 0 0    Difficult doing work/chores Not difficult at  all Not difficult at all Not difficult at all      Allergies  Allergen Reactions   Amoxicillin -Pot Clavulanate Shortness Of Breath and Other (See Comments)    Reaction:  Chest pain  - pt cannot tolerate Augmentin - but can take Amoxicillin    Other Anaphylaxis and Other (See Comments)    Pt states that she is allergic to fresh fruit and tree nuts.   Tree Extract     Eye swelling   Cephalexin Rash   Latex Rash   Neosporin Original [Bacitracin-Neomycin-Polymyxin] Rash   Perineal Cleansing [Balneol] Rash   Social History   Social History Narrative   Ms. Beichner is from [place]. She lives with her husband, 2 small children, and mother-in-law.She works part-time.  Patient is adopted.    Past Medical History:  Diagnosis Date   Acquired arm deformity 06/14/2013   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 Had surgery to lengthen bone when she was 38 y.o. R humerus is shorter than L, Has markedly decreased ROM in shoulder, had steroid injections in past w/minimal relief. Chronic  R shoulder pain.   Allergy    multiple food & environmental allergies (tree pollens & fruits)   Anemia    Arthritis    exercise & allergy induced   Asthma    inhaler last used 2015   Closed displaced fracture of proximal phalanx of lesser toe of left foot 09/13/2016   Complication of anesthesia    post operative pulmonary edema   Degenerative joint disease (DJD) of lumbar spine    Diverticular disease of right side of colon 09/24/2014   Headache    History of postpartum hemorrhage    with last 2 deliveries   Hx of gastric ulcer    Kidney stone    Ovarian cyst, right    surgically removed. 38 y.o.   Stress incontinence of urine 05/18/2017   Past Surgical History:  Procedure Laterality Date   APPENDECTOMY     DILATATION & CURETTAGE/HYSTEROSCOPY WITH MYOSURE N/A 04/16/2021   Procedure: DILATATION & CURETTAGE/HYSTEROSCOPY WITH MYOSURE;  Surgeon: Alyssa Riling, DO;  Location: [facility] OR;  Service: Gynecology;  Laterality: N/A;   HUMERUS SURGERY Right    bone lengthening surgery 38 y.o due to osteomyelitis at growth plate as infant   OVARIAN CYST REMOVAL     stump appendectomy, Pulmonary edema post op   Family History  Adopted: Yes  Problem Relation Age of Onset   Alcohol abuse Neg Hx    Arthritis Neg Hx    Asthma Neg Hx    Birth defects Neg Hx    Cancer Neg Hx    COPD Neg Hx    Depression Neg Hx    Diabetes Neg Hx    Drug abuse Neg Hx    Early death Neg Hx    Hearing loss Neg Hx    Heart disease Neg Hx    Hyperlipidemia Neg Hx    Hypertension Neg Hx    Kidney disease Neg Hx    Learning disabilities Neg Hx    Mental illness Neg Hx    Mental retardation Neg Hx    Miscarriages / Stillbirths Neg Hx    Stroke Neg Hx    Vision loss Neg Hx    Varicose Veins Neg Hx    Allergies as of 11/24/2023       Reactions   Amoxicillin -pot Clavulanate Shortness Of Breath, Other (See Comments)   Reaction:  Chest pain  - pt cannot tolerate Augmentin - but can take Amoxicillin    Other Anaphylaxis, Other (See Comments)   Pt states that she is allergic to fresh fruit and tree nuts.   Tree Extract    Eye swelling   Cephalexin Rash   Latex Rash   Neosporin Original [bacitracin-neomycin-polymyxin] Rash   Perineal Cleansing [balneol] Rash        Medication List        Accurate as of November 24, 2023  3:52 PM. If you have any questions, ask your nurse or doctor.          STOP taking these medications    nitrofurantoin  (macrocrystal-monohydrate) 100 MG capsule Commonly known as: MACROBID  Stopped by: Napolean Backbone       TAKE these medications    celecoxib  200 MG capsule Commonly known as: CeleBREX  Take 1 capsule (200 mg total) by mouth 2 (two) times daily.   Diclofenac  Sodium CR 100 MG 24 hr tablet TAKE 1 TABLET (100 MG TOTAL) BY MOUTH DAILY. WITH FOOD   EPINEPHrine 0.3 mg/0.3 mL Soaj injection Commonly known as: EPI-PEN EpiPen  2-Pak 0.3 mg/0.3 mL injection, auto-injector   fexofenadine-pseudoephedrine 60-120 MG 12 hr tablet Commonly known as: ALLEGRA-D Take 1 tablet by mouth 2 (two) times daily.   fluticasone  50 MCG/ACT nasal spray Commonly known as: FLONASE  Place into both nostrils daily.   montelukast  10 MG tablet Commonly known as: SINGULAIR  Take 1 tablet (10 mg total) by mouth at bedtime.   sulfamethoxazole -trimethoprim  800-160 MG tablet Commonly known as: Bactrim  DS Take 1 tablet by mouth 2 (two) times daily for 5 days. Started by: Napolean Backbone   tamsulosin 0.4 MG Caps capsule Commonly known as: FLOMAX Take 1 capsule (0.4 mg total) by mouth daily. Started by: Napolean Backbone        All past medical history, surgical history, allergies, family history, immunizations andmedications were updated in the EMR today and reviewed under the history and medication portions of their EMR.     ROS Negative, with the exception of above mentioned in HPI   Objective:  BP 112/70   Pulse 83   Temp 98.4 F (36.9 C)   Wt 145 lb (65.8 kg)   LMP 10/31/2023 (Exact Date)   SpO2 97%   Breastfeeding No   BMI 23.40 kg/m  Body mass index is 23.4 kg/m. Physical Exam Vitals and nursing note reviewed.  Constitutional:      General: She is not in acute distress.    Appearance: Normal appearance. She is normal weight. She is not ill-appearing or toxic-appearing.  HENT:     Head: Normocephalic and atraumatic.   Eyes:     General: No scleral icterus.       Right eye: No discharge.        Left eye: No discharge.     Extraocular Movements: Extraocular movements intact.     Conjunctiva/sclera: Conjunctivae normal.     Pupils: Pupils are equal, round, and reactive to light.    Cardiovascular:     Rate and Rhythm: Normal rate and regular rhythm.  Abdominal:     General: Abdomen is flat. There is no distension.     Palpations: Abdomen is soft.     Tenderness: There is no abdominal tenderness. There is no  right CVA tenderness, left CVA tenderness, guarding or rebound.     Hernia: No hernia is present.   Skin:    Findings: No rash.   Neurological:     Mental Status: She is alert and oriented to person, place, and time. Mental status is at baseline.     Motor: No weakness.     Coordination: Coordination normal.     Gait: Gait normal.   Psychiatric:        Mood and Affect: Mood normal.        Behavior: Behavior normal.        Thought Content: Thought content normal.        Judgment: Judgment normal.    No results found. No results found. Results for orders placed or performed in visit on 11/24/23 (from the past 24 hours)  POCT Urinalysis Dipstick (Automated)     Status: Abnormal   Collection Time: 11/24/23 11:38 AM  Result Value Ref Range   Color, UA yellow    Clarity, UA clear    Glucose, UA Negative Negative   Bilirubin, UA negative    Ketones, UA negative    Spec Grav, UA 1.020 1.010 - 1.025   Blood, UA 3+    pH, UA 6.0 5.0 - 8.0   Protein, UA Positive (A) Negative  Urobilinogen, UA negative (A) 0.2 or 1.0 E.U./dL   Nitrite, UA negative    Leukocytes, UA Negative Negative  POCT urine pregnancy     Status: Normal   Collection Time: 11/24/23 11:48 AM  Result Value Ref Range   Preg Test, Ur Negative Negative   Patient's last menstrual period was 10/31/2023 (exact date).  Assessment/Plan: Adin A Deckard is a 38 y.o. female present for OV for  Urinary frequency/hematuria - POCT Urinalysis Dipstick (Automated)> 3+ blood - Urinalysis w microscopic + reflex cultur> pending - POCT urine pregnancy>negative Uncertain causes hematuria.  Urinary frequency may be secondary to bladder irritation/cystitis, icing hematuria.  Cannot rule out she is passing kidney stones or kidney stones have entered ureter.  However she is not in any pain to suggest this to be the case. Elected to present prescription for antibiotic, to start if symptoms worsen over the weekend while we wait on urine  culture. Elected to provide Flomax prescription in the event she starts to have abdominal pain consistent with kidney stones. We will call her with urine culture results once available and discuss further plan. [facility] has an [facility] appointment [facility] for further evaluation on hematuria and kidney stones.  Amenorrhea - POCT urine pregnancy   Reviewed expectations re: course of current medical issues. Discussed self-management of symptoms. Outlined signs and symptoms indicating need for more acute intervention. Patient verbalized understanding and all questions were answered. Patient received an After-Visit Summary.    Orders Placed This Encounter  Procedures   Urinalysis w microscopic + reflex cultur   POCT Urinalysis Dipstick (Automated)   POCT urine pregnancy   Meds ordered this encounter  Medications   sulfamethoxazole -trimethoprim  (BACTRIM  DS) 800-160 MG tablet    Sig: Take 1 tablet by mouth 2 (two) times daily for 5 days.    Dispense:  10 tablet    Refill:  0   tamsulosin (FLOMAX) 0.4 MG CAPS capsule    Sig: Take 1 capsule (0.4 mg total) by mouth daily.    Dispense:  30 capsule    Refill:  3   Referral Orders  No referral(s) requested today     Note is dictated utilizing voice recognition software. Although note has been proof read prior to signing, occasional typographical errors still can be missed. If any questions arise, please do not hesitate to call for verification.   electronically signed by:  Napolean Backbone, DO  [facility] [place]   =

## 2023-11-25 LAB — URINALYSIS W MICROSCOPIC + REFLEX CULTURE
Bacteria, UA: NONE SEEN /HPF
Bilirubin Urine: NEGATIVE
Glucose, UA: NEGATIVE
Hyaline Cast: NONE SEEN /LPF
Ketones, ur: NEGATIVE
Leukocyte Esterase: NEGATIVE
Nitrites, Initial: NEGATIVE
Specific Gravity, Urine: 1.023 (ref 1.001–1.035)
Squamous Epithelial / HPF: NONE SEEN /HPF (ref <=5)
WBC, UA: NONE SEEN /HPF (ref 0–5)
pH: 6 (ref 5.0–8.0)

## 2023-11-25 LAB — NO CULTURE INDICATED

## 2023-11-27 ENCOUNTER — Ambulatory Visit: Payer: Self-pay | Admitting: Family Medicine

## 2023-11-28 NOTE — Progress Notes (Signed)
 Ben Kingsley Farace D.[name] [address] [place] [address] Phone: [phone]   Assessment and Plan:     1. Chronic bilateral low back pain with right-sided sciatica 2. Degeneration of intervertebral disc of lumbosacral region with discogenic back pain and lower extremity pain -Chronic with exacerbation, subsequent visit - Overall improvement in low back pain and radicular symptoms with using Celebrex  200 mg twice daily, however Celebrex  caused GI discomfort after 2 to 3 weeks of continuous use.  Patient still experiencing low back pain with occasional right-sided radicular symptoms, most likely due to degenerative findings at L5-S1 seen on MRI - Reviewed lumbar MRI which showed moderate DDD and endplate edema at O4-D8 - Use Celebrex  200 mg daily as needed for pain.  Recommend limiting chronic NSAIDs to 1-2 doses per week to prevent long-term side effects.   - Use Tylenol  500 to 1000 mg tablets 2-3 times a day for day-to-day pain relief - Start prednisone  Dosepak.  Patient has had symptoms similar to yeast infection in the past with prednisone  course, so we will prescribe Diflucan  to use concurrently with prednisone  course. - Continue HEP and start physical therapy.  Referral sent  Pertinent previous records reviewed include lumbar MRI 11/18/2023  Follow Up: 4 to 6 weeks for reevaluation.  Could consider epidural CSI   Subjective:   I, Alyssa Le, am serving as a Neurosurgeon for Doctor Morene Mace  Chief Complaint: low back pain   HPI:    02/15/2023 Patient is a 38 year old female complaining of low back pain. Patient states - pt has hx of back pain that started a few months ago. Was seen at [facility] and then by PCP. SABRA No known injury. No change in routine. Does have baby to lift. Pain radiates into butt or leg. Nothing provides relief- no improvement w/ patches or ibuprofen  or heating pad.    Sciatica improved w/ Prednisone  taper.   Unfortunately now that she is done w/ the medication, she feels her sxs are returning.  Will start daily Meloxicam  has picked up hasn't started yet .    03/15/2023 Patient states that she is great    04/28/2023 Patient states states she was doing really well, but now she is in flare . She did an exercise class. She isnt able to put her socks on.  had to take a meloxicam  yesterday     11/16/23 Patient states she had no pain up until three weeks ago and then it came back pretty bad. Started the Celebrex  has been about 5 days of that and has noticed some improvement. Started doing heavier weights and thinks that may have done it. Patient was doing very well with her piliates and PT workouts so was trying to do a slight increase in weights.   12/06/2023 Patient states she is better but not a 100% can feel it when she is sitting   Relevant Historical Information: GERD  Additional pertinent review of systems negative.   Current Outpatient Medications:    fluconazole  (DIFLUCAN ) 150 MG tablet, Take 1 tablet (150 mg total) by mouth once for 1 dose., Disp: 1 tablet, Rfl: 1   methylPREDNISolone  (MEDROL  DOSEPAK) 4 MG TBPK tablet, Take 6 tablets on day 1.  Take 5 tablets on day 2.  Take 4 tablets on day 3.  Take 3 tablets on day 4.  Take 2 tablets on day 5.  Take 1 tablet on day 6., Disp: 21 tablet, Rfl: 0  celecoxib  (CELEBREX ) 200 MG capsule, Take 1 capsule (200 mg total) by mouth 2 (two) times daily., Disp: 60 capsule, Rfl: 0   Diclofenac  Sodium CR 100 MG 24 hr tablet, TAKE 1 TABLET (100 MG TOTAL) BY MOUTH DAILY. WITH FOOD, Disp: , Rfl:    EPINEPHrine 0.3 mg/0.3 mL IJ SOAJ injection, EpiPen 2-Pak 0.3 mg/0.3 mL injection, auto-injector, Disp: , Rfl:    fexofenadine-pseudoephedrine (ALLEGRA-D) 60-120 MG 12 hr tablet, Take 1 tablet by mouth 2 (two) times daily., Disp: , Rfl:    fluticasone  (FLONASE ) 50 MCG/ACT nasal spray, Place into both nostrils daily., Disp: , Rfl:    montelukast  (SINGULAIR ) 10 MG  tablet, Take 1 tablet (10 mg total) by mouth at bedtime., Disp: 90 tablet, Rfl: 3   tamsulosin  (FLOMAX ) 0.4 MG CAPS capsule, Take 1 capsule (0.4 mg total) by mouth daily., Disp: 30 capsule, Rfl: 3   Objective:     Vitals:   12/06/23 1005  Pulse: 85  SpO2: 98%  Weight: 145 lb (65.8 kg)  Height: 5' 6 (1.676 m)      Body mass index is 23.4 kg/m.    Physical Exam:    Gen: Appears well, nad, nontoxic and pleasant Psych: Alert and oriented, appropriate mood and affect Neuro: sensation intact, strength is 5/5 in upper and lower extremities, muscle tone wnl Skin: no susupicious lesions or rashes   Back - Normal skin, Spine with normal alignment and no deformity.   No tenderness to vertebral process palpation.   Bilateral lumbar paraspinous muscles are   tender and without spasm, worse on right NTTP gluteal musculature Straight leg raise positive right Trendelenberg negative Piriformis Test negative Gait normal  Central to right low back pain with lumbar extension    Electronically signed by:  Odis Mace D.[name] 11:06 AM 12/06/23

## 2023-12-06 ENCOUNTER — Ambulatory Visit: Admitting: Sports Medicine

## 2023-12-06 VITALS — HR 85 | Ht 66.0 in | Wt 145.0 lb

## 2023-12-06 DIAGNOSIS — M51372 Other intervertebral disc degeneration, lumbosacral region with discogenic back pain and lower extremity pain: Secondary | ICD-10-CM

## 2023-12-06 DIAGNOSIS — B379 Candidiasis, unspecified: Secondary | ICD-10-CM | POA: Diagnosis not present

## 2023-12-06 DIAGNOSIS — G8929 Other chronic pain: Secondary | ICD-10-CM

## 2023-12-06 MED ORDER — FLUCONAZOLE 150 MG PO TABS: 150.0000 mg | ORAL_TABLET | Freq: Once | ORAL | 1 refills | Status: AC

## 2023-12-06 MED ORDER — METHYLPREDNISOLONE 4 MG PO TBPK
ORAL_TABLET | ORAL | 0 refills | Status: AC
Start: 2023-12-06 — End: ?

## 2023-12-06 NOTE — Patient Instructions (Addendum)
 PT referral  Prednisone  dos pak Recommend starting flagyl 4-6 week follow up

## 2023-12-14 DIAGNOSIS — R311 Benign essential microscopic hematuria: Secondary | ICD-10-CM | POA: Diagnosis not present

## 2023-12-14 DIAGNOSIS — R310 Gross hematuria: Secondary | ICD-10-CM | POA: Diagnosis not present

## 2023-12-14 DIAGNOSIS — N200 Calculus of kidney: Secondary | ICD-10-CM | POA: Diagnosis not present

## 2023-12-14 DIAGNOSIS — N393 Stress incontinence (female) (male): Secondary | ICD-10-CM | POA: Diagnosis not present

## 2024-01-16 NOTE — Progress Notes (Deleted)
    [facility] [address] [place] [address] Phone: [phone]   Assessment and Plan:     There are no diagnoses linked to this encounter.  ***   Pertinent previous records reviewed include ***    Follow Up: ***     Subjective:   I, Alyssa Le, am serving as a Neurosurgeon for Doctor Morene Mace  Chief Complaint: low back pain    HPI:    02/15/2023 Patient is a 38 year old female complaining of low back pain. Patient states - pt has hx of back pain that started a few months ago. Was seen at [facility] and then by PCP. Alyssa Le No known injury. No change in routine. Does have baby to lift. Pain radiates into butt or leg. Nothing provides relief- no improvement w/ patches or ibuprofen  or heating pad.    Sciatica improved w/ Prednisone  taper.  Unfortunately now that she is done w/ the medication, she feels her sxs are returning.  Will start daily Meloxicam  has picked up hasn't started yet .    03/15/2023 Patient states that she is great    04/28/2023 Patient states states she was doing really well, but now she is in flare . She did an exercise class. She isnt able to put her socks on.  had to take a meloxicam  yesterday     11/16/23 Patient states she had no pain up until three weeks ago and then it came back pretty bad. Started the Celebrex  has been about 5 days of that and has noticed some improvement. Started doing heavier weights and thinks that may have done it. Patient was doing very well with her piliates and PT workouts so was trying to do a slight increase in weights.   01/17/2024 Patient states   Relevant Historical Information: GERD    Additional pertinent review of systems negative.   Current Outpatient Medications:    celecoxib  (CELEBREX ) 200 MG capsule, Take 1 capsule (200 mg total) by mouth 2 (two) times daily., Disp: 60 capsule, Rfl: 0   Diclofenac  Sodium CR 100 MG 24 hr tablet, TAKE 1 TABLET (100 MG TOTAL) BY  MOUTH DAILY. WITH FOOD, Disp: , Rfl:    EPINEPHrine 0.3 mg/0.3 mL IJ SOAJ injection, EpiPen 2-Pak 0.3 mg/0.3 mL injection, auto-injector, Disp: , Rfl:    fexofenadine-pseudoephedrine (ALLEGRA-D) 60-120 MG 12 hr tablet, Take 1 tablet by mouth 2 (two) times daily., Disp: , Rfl:    fluticasone  (FLONASE ) 50 MCG/ACT nasal spray, Place into both nostrils daily., Disp: , Rfl:    methylPREDNISolone  (MEDROL  DOSEPAK) 4 MG TBPK tablet, Take 6 tablets on day 1.  Take 5 tablets on day 2.  Take 4 tablets on day 3.  Take 3 tablets on day 4.  Take 2 tablets on day 5.  Take 1 tablet on day 6., Disp: 21 tablet, Rfl: 0   montelukast  (SINGULAIR ) 10 MG tablet, Take 1 tablet (10 mg total) by mouth at bedtime., Disp: 90 tablet, Rfl: 3   tamsulosin  (FLOMAX ) 0.4 MG CAPS capsule, Take 1 capsule (0.4 mg total) by mouth daily., Disp: 30 capsule, Rfl: 3   Objective:     There were no vitals filed for this visit.    There is no height or weight on file to calculate BMI.    Physical Exam:    ***   Electronically signed by:  Odis Mace [facility] 7:35 AM 01/16/24

## 2024-01-17 ENCOUNTER — Ambulatory Visit: Admitting: Sports Medicine

## 2024-05-22 ENCOUNTER — Ambulatory Visit: Admitting: Family Medicine

## 2024-05-22 ENCOUNTER — Encounter: Payer: Self-pay | Admitting: Family Medicine

## 2024-05-22 VITALS — BP 98/64 | HR 84 | Temp 97.9°F | Wt 155.7 lb

## 2024-05-22 DIAGNOSIS — Z23 Encounter for immunization: Secondary | ICD-10-CM | POA: Diagnosis not present

## 2024-05-22 DIAGNOSIS — M67432 Ganglion, left wrist: Secondary | ICD-10-CM | POA: Diagnosis not present

## 2024-05-22 DIAGNOSIS — N926 Irregular menstruation, unspecified: Secondary | ICD-10-CM

## 2024-05-22 DIAGNOSIS — M2550 Pain in unspecified joint: Secondary | ICD-10-CM | POA: Diagnosis not present

## 2024-05-22 DIAGNOSIS — L509 Urticaria, unspecified: Secondary | ICD-10-CM | POA: Diagnosis not present

## 2024-05-22 DIAGNOSIS — R5383 Other fatigue: Secondary | ICD-10-CM

## 2024-05-22 LAB — COMPREHENSIVE METABOLIC PANEL WITH GFR
ALT: 14 U/L (ref 3–35)
AST: 14 U/L (ref 5–37)
Albumin: 4.7 g/dL (ref 3.5–5.2)
Alkaline Phosphatase: 51 U/L (ref 39–117)
BUN: 12 mg/dL (ref 6–23)
CO2: 27 meq/L (ref 19–32)
Calcium: 9.2 mg/dL (ref 8.4–10.5)
Chloride: 104 meq/L (ref 96–112)
Creatinine, Ser: 0.57 mg/dL (ref 0.40–1.20)
GFR: 113.33 mL/min (ref >60.00)
Glucose, Bld: 90 mg/dL (ref 70–99)
Potassium: 4.1 meq/L (ref 3.5–5.1)
Sodium: 138 meq/L (ref 135–145)
Total Bilirubin: 0.5 mg/dL (ref 0.2–1.2)
Total Protein: 7.1 g/dL (ref 6.0–8.3)

## 2024-05-22 LAB — IBC + FERRITIN
Ferritin: 33.2 ng/mL (ref 10.0–291.0)
Iron: 132 ug/dL (ref 42–145)
Saturation Ratios: 37.6 % (ref 20.0–50.0)
TIBC: 351.4 ug/dL (ref 250.0–450.0)
Transferrin: 251 mg/dL (ref 212.0–360.0)

## 2024-05-22 LAB — CBC WITH DIFFERENTIAL/PLATELET
Basophils Absolute: 0 K/uL (ref 0.0–0.1)
Basophils Relative: 0.8 % (ref 0.0–3.0)
Eosinophils Absolute: 0.1 K/uL (ref 0.0–0.7)
Eosinophils Relative: 2.5 % (ref 0.0–5.0)
HCT: 39.4 % (ref 36.0–46.0)
Hemoglobin: 13.5 g/dL (ref 12.0–15.0)
Lymphocytes Relative: 33.4 % (ref 12.0–46.0)
Lymphs Abs: 1.6 K/uL (ref 0.7–4.0)
MCHC: 34.2 g/dL (ref 30.0–36.0)
MCV: 89.4 fl (ref 78.0–100.0)
Monocytes Absolute: 0.3 K/uL (ref 0.1–1.0)
Monocytes Relative: 6.9 % (ref 3.0–12.0)
Neutro Abs: 2.7 K/uL (ref 1.4–7.7)
Neutrophils Relative %: 56.4 % (ref 43.0–77.0)
Platelets: 254 K/uL (ref 150.0–400.0)
RBC: 4.4 Mil/uL (ref 3.87–5.11)
RDW: 13.1 % (ref 11.5–15.5)
WBC: 4.8 K/uL (ref 4.0–10.5)

## 2024-05-22 LAB — FOLLICLE STIMULATING HORMONE: FSH: 41.9 m[IU]/mL

## 2024-05-22 LAB — VITAMIN D 25 HYDROXY (VIT D DEFICIENCY, FRACTURES): VITD: 18.87 ng/mL — ABNORMAL LOW (ref 30.00–100.00)

## 2024-05-22 LAB — B12 AND FOLATE PANEL
Folate: 17.8 ng/mL (ref >5.9)
Vitamin B-12: 326 pg/mL (ref 211–911)

## 2024-05-22 LAB — SEDIMENTATION RATE: Sed Rate: 6 mm/h (ref 0–20)

## 2024-05-22 LAB — C-REACTIVE PROTEIN: CRP: <0.5 mg/dL — ABNORMAL LOW (ref 1.0–20.0)

## 2024-05-22 LAB — T4, FREE: Free T4: 0.76 ng/dL (ref 0.60–1.60)

## 2024-05-22 LAB — LUTEINIZING HORMONE: LH: 119.26 m[IU]/mL

## 2024-05-22 LAB — TSH: TSH: 0.7 u[IU]/mL (ref 0.35–5.50)

## 2024-05-22 NOTE — Progress Notes (Unsigned)
 Beadie A Monaco , 03-May-1986, 38 y.o., female MRN: [id number] Patient Care Team    Relationship Specialty Notifications Start End  Catherine Charlies LABOR, DO PCP - General Family Medicine  04/27/16   Delana Ted Morrison, DO Consulting Physician Obstetrics and Gynecology  05/14/21   Pa, [facility]    11/24/23   Leonce Katz, DO Consulting Physician Sports Medicine  11/24/23     Chief Complaint  Patient presents with   Fatigue    Random hives; breast tenderness; neg pregnancy; changes in period.   Joint Pain    Whole body 3 weeks; has a cyst on wrist that appears since sx started      Subjective: Leshia A Seiler is a 38 y.o. Pt presents for an OV with complaints of joint pain and fatigue of 3 weeks duration.  Associated symptoms include bilateral breast tenderness present for over 3 months.  She has a mammogram scheduled.  Her gynecology team Patient reports she has intermittent home use over her neck and face only over the last 3 to 4 months.  She also noticed menstrual cycle changes over the last 3 to 4 months.  She states her menstrual cycles used to be 21 days, and now are occurring about every 5 to 6 weeks that can last 10 days.  Sometimes the first few days are significantly heavy, which is not characteristic for her. She reports the fatigue has been rather significant over the last couple weeks she laid down for a nap which she has never done. She reports the joint discomfort is mostly hips, elbows, arms and shoulders. She also reports she noticed a cyst popped up on her left wrist. She reports her last menstrual period started 05/11/2024 and she just finished 2 days ago. She also notices that she is having more abdominal cramping with her menstrual cycles. She reports she did take a pregnancy test and it was negative. She has had shoulder pain in the past and has seen sports medicine.  They have prescribed anti-inflammatories which she states she does help and it  can help some, but she does not like to take medication routinely.  Patient's last menstrual period was 05/11/2024.      05/22/2024   11:21 AM 10/03/2023   10:44 AM 02/13/2023    8:57 AM 02/02/2023    9:59 AM 11/22/2022   11:06 AM  Depression screen PHQ 2/9  Decreased Interest 0 0 0 0 0  Down, Depressed, Hopeless 0 0 0 0 0  PHQ - 2 Score 0 0 0 0 0  Altered sleeping 0 1 0 0   Tired, decreased energy 0 1 0 0   Change in appetite 0 0 0 0   Feeling bad or failure about yourself  0 0 0 0   Trouble concentrating 0 0 0 0   Moving slowly or fidgety/restless 0 0 0 0   Suicidal thoughts 0 0 0 0   PHQ-9 Score 0 2  0  0    Difficult doing work/chores Not difficult at all Not difficult at all Not difficult at all Not difficult at all      Data saved with a previous flowsheet row definition    Allergies[1] Social History   Social History Narrative   Ms. Marhefka is from [place]. She lives with her husband, 2 small children, and mother-in-law.She works part-time.  Patient is adopted.    Past Medical History:  Diagnosis Date   Acquired  arm deformity 06/14/2013   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 Had surgery to lengthen bone when she was 38 y.o. R humerus is shorter than L, Has markedly decreased ROM in shoulder, had steroid injections in past w/minimal relief. Chronic R shoulder pain.   Allergy    multiple food & environmental allergies (tree pollens & fruits)   Anemia    Arthritis    exercise & allergy induced   Asthma    inhaler last used 2015   Closed displaced fracture of proximal phalanx of lesser toe of left foot 09/13/2016   Complication of anesthesia    post operative pulmonary edema   Degenerative joint disease (DJD) of lumbar spine    Diverticular disease of right side of colon 09/24/2014   Headache    History of postpartum hemorrhage    with last 2 deliveries   Hx of gastric ulcer    Kidney stone    Ovarian cyst,  right    surgically removed. 38 y.o.   Stress incontinence of urine 05/18/2017   Past Surgical History:  Procedure Laterality Date   APPENDECTOMY     DILATATION & CURETTAGE/HYSTEROSCOPY WITH MYOSURE N/A 04/16/2021   Procedure: DILATATION & CURETTAGE/HYSTEROSCOPY WITH MYOSURE;  Surgeon: Delana Ted Morrison, DO;  Location: [facility] OR;  Service: Gynecology;  Laterality: N/A;   HUMERUS SURGERY Right    bone lengthening surgery 38 y.o due to osteomyelitis at growth plate as infant   OVARIAN CYST REMOVAL     stump appendectomy, Pulmonary edema post op   Family History  Adopted: Yes  Problem Relation Age of Onset   Alcohol abuse Neg Hx    Arthritis Neg Hx    Asthma Neg Hx    Birth defects Neg Hx    Cancer Neg Hx    COPD Neg Hx    Depression Neg Hx    Diabetes Neg Hx    Drug abuse Neg Hx    Early death Neg Hx    Hearing loss Neg Hx    Heart disease Neg Hx    Hyperlipidemia Neg Hx    Hypertension Neg Hx    Kidney disease Neg Hx    Learning disabilities Neg Hx    Mental illness Neg Hx    Mental retardation Neg Hx    Miscarriages / Stillbirths Neg Hx    Stroke Neg Hx    Vision loss Neg Hx    Varicose Veins Neg Hx    Allergies as of 05/22/2024       Reactions   Amoxicillin -pot Clavulanate Shortness Of Breath, Other (See Comments)   Reaction:  Chest pain  - pt cannot tolerate Augmentin - but can take Amoxicillin    Other Anaphylaxis, Other (See Comments)   Pt states that she is allergic to fresh fruit and tree nuts.   Tree Extract    Eye swelling   Cephalexin Rash   Latex Rash   Neosporin Original [bacitracin-neomycin-polymyxin] Rash   Perineal Cleansing [balneol] Rash        Medication List        Accurate as of May 22, 2024 11:59 PM. If you have any questions, ask your nurse or doctor.          STOP taking these medications    celecoxib  200 MG capsule Commonly known as: CeleBREX  Stopped by: Charlies Bellini, DO   fexofenadine-pseudoephedrine 60-120 MG 12 hr  tablet Commonly known as: ALLEGRA-D Stopped by: Jessilynn Taft, DO  methylPREDNISolone  4 MG Tbpk tablet Commonly known as: MEDROL  DOSEPAK Stopped by: Pearlene Teat, DO       TAKE these medications    Diclofenac  Sodium CR 100 MG 24 hr tablet TAKE 1 TABLET (100 MG TOTAL) BY MOUTH DAILY. WITH FOOD   EPINEPHrine 0.3 mg/0.3 mL Soaj injection Commonly known as: EPI-PEN EpiPen 2-Pak 0.3 mg/0.3 mL injection, auto-injector   fluticasone  50 MCG/ACT nasal spray Commonly known as: FLONASE  Place into both nostrils daily.   montelukast  10 MG tablet Commonly known as: SINGULAIR  Take 1 tablet (10 mg total) by mouth at bedtime.   tamsulosin  0.4 MG Caps capsule Commonly known as: FLOMAX  Take 1 capsule (0.4 mg total) by mouth daily.        All past medical history, surgical history, allergies, family history, immunizations andmedications were updated in the EMR today and reviewed under the history and medication portions of their EMR.     ROS Negative, with the exception of above mentioned in HPI   Objective:  BP 98/64   Pulse 84   Temp 97.9 F (36.6 C)   Wt 155 lb 11.2 oz (70.6 kg)   LMP 05/11/2024   SpO2 99%   BMI 25.13 kg/m  Body mass index is 25.13 kg/m.  Physical Exam Vitals and nursing note reviewed.  Constitutional:      General: She is not in acute distress.    Appearance: Normal appearance. She is normal weight. She is not ill-appearing or toxic-appearing.  HENT:     Head: Normocephalic and atraumatic.  Eyes:     General: No scleral icterus.       Right eye: No discharge.        Left eye: No discharge.     Extraocular Movements: Extraocular movements intact.     Conjunctiva/sclera: Conjunctivae normal.     Pupils: Pupils are equal, round, and reactive to light.  Neck:     Comments: No thyromegaly Cardiovascular:     Heart sounds: No murmur heard. Musculoskeletal:        General: No swelling or tenderness. Normal range of motion.     Cervical back: Neck  supple. No tenderness.     Comments: Small resolving ganglion cyst left wrist radial aspect  Lymphadenopathy:     Cervical: No cervical adenopathy.  Skin:    Findings: No rash.  Neurological:     Mental Status: She is alert and oriented to person, place, and time. Mental status is at baseline.     Motor: No weakness.     Coordination: Coordination normal.     Gait: Gait normal.  Psychiatric:        Mood and Affect: Mood normal.        Behavior: Behavior normal.        Thought Content: Thought content normal.        Judgment: Judgment normal.      No results found. No results found. No results found for this or any previous visit (from the past 24 hours).  Assessment/Plan: Lory A Sahakian is a 38 y.o. female present for OV for  Need for influenza vaccination - Flu vaccine trivalent PF, 6mos and older(Flulaval,Afluria,Fluarix,Fluzone)  Arthralgia/fatigue. Patient has symmetrical joint pain of shoulders, elbows and hips.  Will rule out autoimmune, vitamin deficiencies and endocrine disorders. - Sedimentation rate - C-reactive protein - TSH - T4, free - Vitamin D  (25 hydroxy) - B12 and Folate Panel - CBC w/Diff - IBC + Ferritin - Comp Met (CMET) - ANA,  IFA Comprehensive Panel-([facility]) - Rheumatoid Factor - Cyclic citrul peptide antibody, IgG ([facility]) - Thyroid  peroxidase antibody Patient will be informed of results and further plan discussed at that time.  Hives - TSH - T4, free - Thyroid  peroxidase antibody  Menstrual changes - TSH - T4, free - CBC w/Diff - IBC + Ferritin - Thyroid  peroxidase antibody - FSH - LH - Estradiol   Ganglion cyst of dorsum of left wrist Resolving.  Reassured  Reviewed expectations re: course of current medical issues. Discussed self-management of symptoms. Outlined signs and symptoms indicating need for more acute intervention. Patient verbalized understanding and all questions were answered. Patient received an After-Visit  Summary.    Orders Placed This Encounter  Procedures   Flu vaccine trivalent PF, 6mos and older(Flulaval,Afluria,Fluarix,Fluzone)   Sedimentation rate   C-reactive protein   TSH   T4, free   Vitamin D  (25 hydroxy)   B12 and Folate Panel   CBC w/Diff   IBC + Ferritin   Comp Met (CMET)   ANA, IFA Comprehensive Panel-([facility])   Rheumatoid Factor   Cyclic citrul peptide antibody, IgG ([facility])   Thyroid  peroxidase antibody   FSH   LH   Estradiol    No orders of the defined types were placed in this encounter.  Referral Orders  No referral(s) requested today     Note is dictated utilizing voice recognition software. Although note has been proof read prior to signing, occasional typographical errors still can be missed. If any questions arise, please do not hesitate to call for verification.   electronically signed by:  Charlies Bellini, DO  [facility] - OR       [1]  Allergies Allergen Reactions   Amoxicillin -Pot Clavulanate Shortness Of Breath and Other (See Comments)    Reaction:  Chest pain  - pt cannot tolerate Augmentin - but can take Amoxicillin    Other Anaphylaxis and Other (See Comments)    Pt states that she is allergic to fresh fruit and tree nuts.   Tree Extract     Eye swelling   Cephalexin Rash   Latex Rash   Neosporin Original [Bacitracin-Neomycin-Polymyxin] Rash   Perineal Cleansing [Balneol] Rash

## 2024-05-22 NOTE — Patient Instructions (Addendum)
 We will contact you with lab results and discuss further plan and follow up        Great to see you today.  I have refilled the medication(s) we provide.   If labs were collected or images ordered, we will inform you of  results once we have received them and reviewed. We will contact you either by echart message, or telephone call.  Please give ample time to the testing facility, and our office to run,  receive and review results. Please do not call inquiring of results, even if you can see them in your chart. We will contact you as soon as we are able. If it has been over 1 week since the test was completed, and you have not yet heard from us , then please call us .    - echart message- for normal results that have been seen by the patient already.   - telephone call: abnormal results or if patient has not viewed results in their echart.  If a referral to a specialist was entered for you, please call us  in 2 weeks if you have not heard from the specialist office to schedule.

## 2024-05-23 ENCOUNTER — Ambulatory Visit: Payer: Self-pay | Admitting: Family Medicine

## 2024-05-23 DIAGNOSIS — R7689 Other specified abnormal immunological findings in serum: Secondary | ICD-10-CM

## 2024-05-23 MED ORDER — VITAMIN D (ERGOCALCIFEROL) 1.25 MG (50000 UNIT) PO CAPS: 50000.0000 [IU] | ORAL_CAPSULE | ORAL | 0 refills | Status: AC

## 2024-05-24 LAB — RHEUMATOID FACTOR: Rheumatoid fact SerPl-aCnc: <10 [IU]/mL (ref <14)

## 2024-05-24 LAB — ANTI-NUCLEAR AB-TITER (ANA TITER): ANA Titer 1: 1:40 {titer} — ABNORMAL HIGH

## 2024-05-24 LAB — CYCLIC CITRUL PEPTIDE ANTIBODY, IGG: Cyclic Citrullin Peptide Ab: <16 U

## 2024-05-24 LAB — ANA, IFA COMPREHENSIVE PANEL
Anti Nuclear Antibody (ANA): POSITIVE — AB
ENA SM Ab Ser-aCnc: <1 AI
SM/RNP: <1 AI
SSA (Ro) (ENA) Antibody, IgG: <1 AI
SSB (La) (ENA) Antibody, IgG: <1 AI
Scleroderma (Scl-70) (ENA) Antibody, IgG: <1 AI
ds DNA Ab: 3 [IU]/mL

## 2024-05-24 LAB — ESTRADIOL: Estradiol: 379 pg/mL — ABNORMAL HIGH

## 2024-05-24 LAB — THYROID PEROXIDASE ANTIBODY: Thyroperoxidase Ab SerPl-aCnc: 1 [IU]/mL

## 2024-05-27 ENCOUNTER — Encounter: Payer: Self-pay | Admitting: Family Medicine
